# Patient Record
Sex: Male | Born: 1940
Health system: Southern US, Community
[De-identification: ages and names within clinical notes are randomized; demographics above are authoritative.]

## PROBLEM LIST (undated history)

## (undated) DIAGNOSIS — I499 Cardiac arrhythmia, unspecified: Secondary | ICD-10-CM

## (undated) DIAGNOSIS — M1712 Unilateral primary osteoarthritis, left knee: Secondary | ICD-10-CM

## (undated) DIAGNOSIS — N529 Male erectile dysfunction, unspecified: Secondary | ICD-10-CM

## (undated) DIAGNOSIS — J45909 Unspecified asthma, uncomplicated: Secondary | ICD-10-CM

## (undated) DIAGNOSIS — C4359 Malignant melanoma of other part of trunk: Secondary | ICD-10-CM

## (undated) DIAGNOSIS — N200 Calculus of kidney: Secondary | ICD-10-CM

## (undated) DIAGNOSIS — B351 Tinea unguium: Secondary | ICD-10-CM

## (undated) DIAGNOSIS — G4730 Sleep apnea, unspecified: Secondary | ICD-10-CM

## (undated) DIAGNOSIS — Z87442 Personal history of urinary calculi: Secondary | ICD-10-CM

## (undated) DIAGNOSIS — I10 Essential (primary) hypertension: Secondary | ICD-10-CM

## (undated) DIAGNOSIS — T7840XA Allergy, unspecified, initial encounter: Secondary | ICD-10-CM

## (undated) DIAGNOSIS — K5790 Diverticulosis of intestine, part unspecified, without perforation or abscess without bleeding: Secondary | ICD-10-CM

## (undated) DIAGNOSIS — K219 Gastro-esophageal reflux disease without esophagitis: Secondary | ICD-10-CM

## (undated) HISTORY — DX: Sleep apnea, unspecified: G47.30

## (undated) HISTORY — DX: Diverticulosis of intestine, part unspecified, without perforation or abscess without bleeding: K57.90

## (undated) HISTORY — DX: Unilateral primary osteoarthritis, left knee: M17.12

## (undated) HISTORY — DX: Male erectile dysfunction, unspecified: N52.9

## (undated) HISTORY — PX: OTHER SURGICAL HISTORY: SHX169

## (undated) HISTORY — DX: Gastro-esophageal reflux disease without esophagitis: K21.9

## (undated) HISTORY — PX: TOTAL KNEE ARTHROPLASTY: SHX125

## (undated) HISTORY — DX: Essential (primary) hypertension: I10

## (undated) HISTORY — PX: EYE SURGERY: SHX253

## (undated) HISTORY — DX: Allergy, unspecified, initial encounter: T78.40XA

## (undated) HISTORY — PX: NASAL POLYP SURGERY: SHX186

## (undated) HISTORY — PX: HERNIA REPAIR: SHX51

## (undated) HISTORY — DX: Calculus of kidney: N20.0

## (undated) HISTORY — DX: Tinea unguium: B35.1

## (undated) HISTORY — DX: Malignant melanoma of other part of trunk: C43.59

## (undated) HISTORY — PX: TONSILLECTOMY: SUR1361

---

## 1972-12-23 DIAGNOSIS — C4359 Malignant melanoma of other part of trunk: Secondary | ICD-10-CM

## 1972-12-23 HISTORY — PX: OTHER SURGICAL HISTORY: SHX169

## 1972-12-23 HISTORY — DX: Malignant melanoma of other part of trunk: C43.59

## 1972-12-23 HISTORY — PX: TONSILLECTOMY: SUR1361

## 1979-12-24 HISTORY — PX: OTHER SURGICAL HISTORY: SHX169

## 2003-11-22 ENCOUNTER — Encounter: Payer: Self-pay | Admitting: Internal Medicine

## 2003-11-22 ENCOUNTER — Encounter: Payer: Self-pay | Admitting: Gastroenterology

## 2004-11-20 ENCOUNTER — Ambulatory Visit: Payer: Self-pay | Admitting: Internal Medicine

## 2005-01-16 ENCOUNTER — Ambulatory Visit: Payer: Self-pay | Admitting: Internal Medicine

## 2005-02-08 ENCOUNTER — Ambulatory Visit: Payer: Self-pay | Admitting: Internal Medicine

## 2005-04-11 ENCOUNTER — Ambulatory Visit: Payer: Self-pay | Admitting: Internal Medicine

## 2005-10-21 ENCOUNTER — Ambulatory Visit: Payer: Self-pay | Admitting: Internal Medicine

## 2006-01-21 ENCOUNTER — Ambulatory Visit: Payer: Self-pay | Admitting: Internal Medicine

## 2006-01-28 ENCOUNTER — Ambulatory Visit: Payer: Self-pay | Admitting: Internal Medicine

## 2006-02-20 ENCOUNTER — Ambulatory Visit: Payer: Self-pay | Admitting: Internal Medicine

## 2006-09-25 ENCOUNTER — Ambulatory Visit: Payer: Self-pay | Admitting: Internal Medicine

## 2007-01-29 ENCOUNTER — Ambulatory Visit: Payer: Self-pay | Admitting: Internal Medicine

## 2007-01-29 LAB — CONVERTED CEMR LAB
ALT: 31 units/L (ref 0–40)
Albumin: 3.7 g/dL (ref 3.5–5.2)
Basophils Absolute: 0.1 10*3/uL (ref 0.0–0.1)
Bilirubin, Direct: 0.1 mg/dL (ref 0.0–0.3)
Calcium: 9.2 mg/dL (ref 8.4–10.5)
Cholesterol: 185 mg/dL (ref 0–200)
Eosinophils Absolute: 0.3 10*3/uL (ref 0.0–0.6)
Eosinophils Relative: 4.3 % (ref 0.0–5.0)
GFR calc Af Amer: 109 mL/min
GFR calc non Af Amer: 90 mL/min
Glucose, Bld: 96 mg/dL (ref 70–99)
HDL: 54.5 mg/dL (ref >39.0)
Lymphocytes Relative: 26 % (ref 12.0–46.0)
MCHC: 34 g/dL (ref 30.0–36.0)
MCV: 95.7 fL (ref 78.0–100.0)
Neutro Abs: 3.4 10*3/uL (ref 1.4–7.7)
Platelets: 205 10*3/uL (ref 150–400)
Sodium: 142 meq/L (ref 135–145)
TSH: 2.08 microintl units/mL (ref 0.35–5.50)
Total CHOL/HDL Ratio: 3.4
Triglycerides: 92 mg/dL (ref 0–149)
WBC: 6 10*3/uL (ref 4.5–10.5)

## 2007-08-11 ENCOUNTER — Ambulatory Visit: Payer: Self-pay | Admitting: Internal Medicine

## 2008-01-19 ENCOUNTER — Encounter: Payer: Self-pay | Admitting: Internal Medicine

## 2008-02-02 ENCOUNTER — Ambulatory Visit: Payer: Self-pay | Admitting: Internal Medicine

## 2008-02-02 LAB — CONVERTED CEMR LAB
Alkaline Phosphatase: 50 units/L (ref 39–117)
BUN: 17 mg/dL (ref 6–23)
Basophils Absolute: 0 10*3/uL (ref 0.0–0.1)
Bilirubin Urine: NEGATIVE
CO2: 32 meq/L (ref 19–32)
Cholesterol: 169 mg/dL (ref 0–200)
Eosinophils Absolute: 0.2 10*3/uL (ref 0.0–0.6)
GFR calc Af Amer: 96 mL/min
HDL: 46.4 mg/dL (ref >39.0)
Hemoglobin: 15.9 g/dL (ref 13.0–17.0)
Ketones, urine, test strip: NEGATIVE
Lymphocytes Relative: 27 % (ref 12.0–46.0)
MCHC: 33.9 g/dL (ref 30.0–36.0)
MCV: 96.5 fL (ref 78.0–100.0)
Monocytes Absolute: 0.6 10*3/uL (ref 0.2–0.7)
Monocytes Relative: 9.7 % (ref 3.0–11.0)
Neutro Abs: 3.7 10*3/uL (ref 1.4–7.7)
Potassium: 4 meq/L (ref 3.5–5.1)
Specific Gravity, Urine: 1.025
TSH: 2.33 microintl units/mL (ref 0.35–5.50)
Total Protein: 5.9 g/dL — ABNORMAL LOW (ref 6.0–8.3)

## 2008-02-09 ENCOUNTER — Ambulatory Visit: Payer: Self-pay | Admitting: Internal Medicine

## 2008-02-09 DIAGNOSIS — F528 Other sexual dysfunction not due to a substance or known physiological condition: Secondary | ICD-10-CM | POA: Insufficient documentation

## 2008-02-09 DIAGNOSIS — I10 Essential (primary) hypertension: Secondary | ICD-10-CM | POA: Insufficient documentation

## 2008-02-09 DIAGNOSIS — Z87442 Personal history of urinary calculi: Secondary | ICD-10-CM | POA: Insufficient documentation

## 2008-02-09 DIAGNOSIS — Z85820 Personal history of malignant melanoma of skin: Secondary | ICD-10-CM | POA: Insufficient documentation

## 2008-08-09 ENCOUNTER — Ambulatory Visit: Payer: Self-pay | Admitting: Internal Medicine

## 2008-09-27 ENCOUNTER — Encounter: Payer: Self-pay | Admitting: *Deleted

## 2008-10-12 ENCOUNTER — Ambulatory Visit: Payer: Self-pay | Admitting: Internal Medicine

## 2008-10-12 DIAGNOSIS — L570 Actinic keratosis: Secondary | ICD-10-CM | POA: Insufficient documentation

## 2008-11-07 ENCOUNTER — Telehealth: Payer: Self-pay | Admitting: Internal Medicine

## 2008-11-21 ENCOUNTER — Ambulatory Visit: Payer: Self-pay | Admitting: Gastroenterology

## 2008-11-21 DIAGNOSIS — R1319 Other dysphagia: Secondary | ICD-10-CM | POA: Insufficient documentation

## 2008-11-21 DIAGNOSIS — K219 Gastro-esophageal reflux disease without esophagitis: Secondary | ICD-10-CM | POA: Insufficient documentation

## 2008-12-07 ENCOUNTER — Ambulatory Visit: Payer: Self-pay | Admitting: Gastroenterology

## 2008-12-07 ENCOUNTER — Encounter: Payer: Self-pay | Admitting: Gastroenterology

## 2008-12-18 ENCOUNTER — Encounter: Payer: Self-pay | Admitting: Gastroenterology

## 2009-01-25 ENCOUNTER — Encounter: Payer: Self-pay | Admitting: Internal Medicine

## 2009-02-02 ENCOUNTER — Ambulatory Visit: Payer: Self-pay | Admitting: Internal Medicine

## 2009-02-02 LAB — CONVERTED CEMR LAB
Albumin: 3.8 g/dL (ref 3.5–5.2)
BUN: 20 mg/dL (ref 6–23)
Basophils Relative: 0.5 % (ref 0.0–3.0)
Creatinine, Ser: 1 mg/dL (ref 0.4–1.5)
Eosinophils Absolute: 0.2 10*3/uL (ref 0.0–0.7)
Eosinophils Relative: 3.4 % (ref 0.0–5.0)
GFR calc Af Amer: 96 mL/min
GFR calc non Af Amer: 79 mL/min
Glucose, Urine, Semiquant: NEGATIVE
HCT: 45.7 % (ref 39.0–52.0)
HDL: 50.1 mg/dL (ref >39.0)
Hemoglobin: 16.4 g/dL (ref 13.0–17.0)
MCV: 95 fL (ref 78.0–100.0)
Monocytes Absolute: 0.7 10*3/uL (ref 0.1–1.0)
Neutro Abs: 3.8 10*3/uL (ref 1.4–7.7)
PSA: 1 ng/mL (ref 0.10–4.00)
RBC: 4.8 M/uL (ref 4.22–5.81)
Specific Gravity, Urine: 1.02
Total Protein: 6.3 g/dL (ref 6.0–8.3)
WBC Urine, dipstick: NEGATIVE
WBC: 6.5 10*3/uL (ref 4.5–10.5)
pH: 6

## 2009-02-08 ENCOUNTER — Ambulatory Visit: Payer: Self-pay | Admitting: Internal Medicine

## 2009-02-08 DIAGNOSIS — B351 Tinea unguium: Secondary | ICD-10-CM | POA: Insufficient documentation

## 2009-02-16 ENCOUNTER — Ambulatory Visit: Payer: Self-pay | Admitting: Internal Medicine

## 2009-02-17 ENCOUNTER — Ambulatory Visit: Payer: Self-pay | Admitting: Internal Medicine

## 2009-02-21 ENCOUNTER — Telehealth: Payer: Self-pay | Admitting: Internal Medicine

## 2009-03-29 ENCOUNTER — Ambulatory Visit: Payer: Self-pay | Admitting: Internal Medicine

## 2009-03-29 LAB — CONVERTED CEMR LAB
Bilirubin, Direct: 0.2 mg/dL (ref 0.0–0.3)
Total Bilirubin: 1.2 mg/dL (ref 0.3–1.2)
Total Protein: 6.4 g/dL (ref 6.0–8.3)

## 2009-04-12 ENCOUNTER — Ambulatory Visit: Payer: Self-pay | Admitting: Internal Medicine

## 2009-10-04 ENCOUNTER — Ambulatory Visit: Payer: Self-pay | Admitting: Internal Medicine

## 2010-02-08 ENCOUNTER — Ambulatory Visit: Payer: Self-pay | Admitting: Internal Medicine

## 2010-02-08 LAB — CONVERTED CEMR LAB
ALT: 24 units/L (ref 0–53)
BUN: 19 mg/dL (ref 6–23)
Basophils Absolute: 0 10*3/uL (ref 0.0–0.1)
Bilirubin Urine: NEGATIVE
Bilirubin, Direct: 0.1 mg/dL (ref 0.0–0.3)
CO2: 31 meq/L (ref 19–32)
Chloride: 105 meq/L (ref 96–112)
Cholesterol: 159 mg/dL (ref 0–200)
Creatinine, Ser: 0.9 mg/dL (ref 0.4–1.5)
Eosinophils Absolute: 0.2 10*3/uL (ref 0.0–0.7)
Glucose, Bld: 94 mg/dL (ref 70–99)
Glucose, Urine, Semiquant: NEGATIVE
HCT: 45.6 % (ref 39.0–52.0)
Ketones, urine, test strip: NEGATIVE
LDL Cholesterol: 89 mg/dL (ref 0–99)
Lymphs Abs: 1.7 10*3/uL (ref 0.7–4.0)
MCHC: 33.9 g/dL (ref 30.0–36.0)
MCV: 96.7 fL (ref 78.0–100.0)
Monocytes Absolute: 0.6 10*3/uL (ref 0.1–1.0)
Neutrophils Relative %: 58 % (ref 43.0–77.0)
PSA: 1.31 ng/mL (ref 0.10–4.00)
Platelets: 171 10*3/uL (ref 150.0–400.0)
Potassium: 3.5 meq/L (ref 3.5–5.1)
Protein, U semiquant: NEGATIVE
RDW: 11.8 % (ref 11.5–14.6)
TSH: 2.34 microintl units/mL (ref 0.35–5.50)
Total Bilirubin: 0.7 mg/dL (ref 0.3–1.2)
Triglycerides: 99 mg/dL (ref 0.0–149.0)
Urobilinogen, UA: 0.2
WBC: 6 10*3/uL (ref 4.5–10.5)
pH: 6

## 2010-02-20 ENCOUNTER — Ambulatory Visit: Payer: Self-pay | Admitting: Internal Medicine

## 2010-02-20 DIAGNOSIS — I8310 Varicose veins of unspecified lower extremity with inflammation: Secondary | ICD-10-CM | POA: Insufficient documentation

## 2010-03-22 ENCOUNTER — Encounter: Payer: Self-pay | Admitting: Internal Medicine

## 2010-08-21 ENCOUNTER — Telehealth: Payer: Self-pay | Admitting: Internal Medicine

## 2010-08-23 ENCOUNTER — Ambulatory Visit: Payer: Self-pay | Admitting: Internal Medicine

## 2010-08-23 DIAGNOSIS — K4031 Unilateral inguinal hernia, with obstruction, without gangrene, recurrent: Secondary | ICD-10-CM | POA: Insufficient documentation

## 2010-09-04 ENCOUNTER — Ambulatory Visit: Payer: Self-pay | Admitting: Family Medicine

## 2010-09-05 ENCOUNTER — Encounter: Payer: Self-pay | Admitting: Internal Medicine

## 2010-09-05 ENCOUNTER — Encounter: Admission: RE | Admit: 2010-09-05 | Discharge: 2010-09-05 | Payer: Self-pay | Admitting: Internal Medicine

## 2010-09-10 ENCOUNTER — Encounter: Payer: Self-pay | Admitting: Internal Medicine

## 2010-09-17 ENCOUNTER — Encounter: Payer: Self-pay | Admitting: Internal Medicine

## 2010-09-17 ENCOUNTER — Encounter: Admission: RE | Admit: 2010-09-17 | Discharge: 2010-09-17 | Payer: Self-pay | Admitting: Surgery

## 2010-09-18 ENCOUNTER — Encounter: Admission: RE | Admit: 2010-09-18 | Discharge: 2010-09-18 | Payer: Self-pay | Admitting: Surgery

## 2010-09-18 ENCOUNTER — Encounter: Payer: Self-pay | Admitting: Internal Medicine

## 2010-09-19 ENCOUNTER — Telehealth: Payer: Self-pay | Admitting: Internal Medicine

## 2010-09-20 ENCOUNTER — Ambulatory Visit
Admission: RE | Admit: 2010-09-20 | Discharge: 2010-09-20 | Payer: Self-pay | Source: Home / Self Care | Admitting: Surgery

## 2011-01-22 NOTE — Consult Note (Signed)
Summary: [facility]  [facility]   Imported By: Maryln Gottron 09/17/2010 12:58:54  _____________________________________________________________________  External Attachment:    Type:   Image     Comment:   External Document

## 2011-01-22 NOTE — Letter (Signed)
Summary: [facility]  [facility]   Imported By: Maryln Gottron 04/04/2010 15:52:51  _____________________________________________________________________  External Attachment:    Type:   Image     Comment:   External Document

## 2011-01-22 NOTE — Progress Notes (Signed)
Summary: CT results  Phone Note Call from Patient Call back at Home Phone [phone]   Reason for Call: Talk to Nurse Summary of Call: CXR then CT yesterday. Initial call taken by: Rudy Jew, RN,  September 19, 2010 9:00 AM  Follow-up for Phone Call        pt informed of ct report with no evidence of chest nodule Follow-up by: Willy Eddy, LPN,  September 19, 2010 9:26 AM

## 2011-01-22 NOTE — Assessment & Plan Note (Signed)
Summary: cough/dm   Vital Signs:  Patient profile:   70 year old male Height:      72 inches (182.88 cm) Weight:      200 pounds (90.91 kg) O2 Sat:      98 % on Room air Temp:     98.2 degrees F (36.78 degrees C) oral Pulse rate:   78 / minute BP sitting:   138 / 80  (left arm) Cuff size:   regular  Vitals Entered By: Josph Macho RMA (September 04, 2010 9:28 AM)  O2 Flow:  Room air  Serial Vital Signs/Assessments:  Time      Position  BP       Pulse  Resp  Temp     By                     136/80                         Danise Edge MD  CC: Cough X1 month/ Flu vaccination/ CF Is Patient Diabetic? No   History of Present Illness: Patient in today with his wife noting a persistent cough for the last month. Flew to [place] and traveled around the [place] and was noting the cough. His symptoms developed upon arrival in [place] and persisted until his return. He denies fevers, chills, hemoptysis, headache, chest pain, palpitations, shortness of breath. He took azithromycin course at the end of August and his symptoms improved greatly but never fully recovered he describes a hacking cough that started in particular is generally at the end of the day. Her coughing can be severe to cause him to feel somewhat strangled just for seconds and then he improves. His cough is productive on occasion of gray sputum. He does acknowledge some anorexia but denies any nausea, vomiting, diarrhea, constipation. While in the week he try local cough syrup but unfortunately developed diarrhea with that. Upon discontinuation the diarrhea resolved. He denies any ear or throat pain. They were visiting their children and their children did not have any similar symptoms.  Current Medications (verified): 1)  Multivitamins   Tabs (Multiple Vitamin) .... Once Daily 2)  Oscal 500/200 D-3 500-200 Mg-Unit  Tabs (Calcium-Vitamin D) .... Take 2 Tablets By Mouth Once Daily 3)  Exforge Hct 10-320-25 Mg Tabs  (Amlodipine-Valsartan-Hctz) .... Take 1 Tablet By Mouth Once A Day 4)  Omeprazole 40 Mg  Cpdr (Omeprazole) .Marland Kitchen.. 1 Each Day 30 Minutes Before Meal 5)  Viagra 100 Mg Tabs (Sildenafil Citrate) .... One By Mouth As Directed 6)  Aspir-Low 81 Mg Tbec (Aspirin) .Marland Kitchen.. 1 Once Daily 7)  Vitamin C Cr 500 Mg Cr-Tabs (Ascorbic Acid) .Marland Kitchen.. 1 Once Daily 8)  Eye-Vites  Tabs (Multiple Vitamins-Minerals) .Marland Kitchen.. 1 Once Daily  Allergies (verified): No Known Drug Allergies  Past History:  Past medical history reviewed for relevance to current acute and chronic problems. Social history (including risk factors) reviewed for relevance to current acute and chronic problems.  Past Medical History: Reviewed history from 02/08/2009 and no changes required. Skin cancer, hx of-Removal of melanoma on back in 1974 Hypertension ED Renal Stones Diverticulosis fungal nail  Social History: Reviewed history from 11/21/2008 and no changes required. Married Never Smoked Drug use-no Alcohol Use - yes-2 glasses per week Patient gets regular exercise.  Review of Systems      See HPI       Flu Vaccine Consent Questions     Do  you have a history of severe allergic reactions to this vaccine? no    Any prior history of allergic reactions to egg and/or gelatin? no    Do you have a sensitivity to the preservative Thimersol? no    Do you have a past history of Guillan-Barre Syndrome? no    Do you currently have an acute febrile illness? no    Have you ever had a severe reaction to latex? no    Vaccine information given and explained to patient? yes    Are you currently pregnant? no    Lot Number:[id number]   Exp Date:06/22/2011   Site Given  Left Deltoid IM Josph Macho RMA  September 04, 2010 9:41 AM   Physical Exam  General:  Well-developed,well-nourished,in no acute distress; alert,appropriate and cooperative throughout examination Head:  Normocephalic and atraumatic without obvious abnormalities. No apparent  alopecia or balding. Ears:  External ear exam shows no significant lesions or deformities.  Otoscopic examination reveals clear canals, tympanic membranes are intact bilaterally without bulging, retraction, inflammation or discharge. Hearing is grossly normal bilaterally. Nose:  External nasal examination shows no deformity or inflammation. Nasal mucosa are pink and moist without lesions or exudates. Mouth:  Oral mucosa and oropharynx without lesions or exudates.   Neck:  No deformities, masses, or tenderness noted. Lungs:  Normal respiratory effort, chest expands symmetrically. Lungs are clear to auscultation, no crackles or wheezes. Heart:  Normal rate and regular rhythm. S1 and S2 normal without gallop, murmur, click, rub or other extra sounds. Abdomen:  Bowel sounds positive,abdomen soft and non-tender without masses, organomegaly or hernias noted. Extremities:  No clubbing, cyanosis, edema, or deformity noted  Cervical Nodes:  No lymphadenopathy noted Psych:  Cognition and judgment appear intact. Alert and cooperative with normal attention span and concentration. No apparent delusions, illusions, hallucinations   Impression & Recommendations:  Problem # 1:  ACUTE BRONCHITIS (ICD-466.0)  The following medications were removed from the medication list:    Zithromax Z-pak 250 Mg Tabs (Azithromycin) .Marland Kitchen... Take as directeed His updated medication list for this problem includes:    Tussionex Pennkinetic Er 10-8 Mg/70ml Lqcr (Hydrocod polst-chlorphen polst) .Marland Kitchen... 1 tsp by mouth at bedtime as needed cough    Tessalon 200 Mg Caps (Benzonatate) .Marland Kitchen... 1 cap by mouth two times a day as needed cough    Azithromycin 250 Mg Tabs (Azithromycin) .Marland Kitchen... 2 tabs by mouth once and then 1 tab by mouth once daily x 4 days, only take if symptoms worsen.  Orders: Prescription Created Electronically [phone])  Problem # 2:  HYPERTENSION (ICD-401.9)  His updated medication list for this problem includes:     Exforge Hct 10-320-25 Mg Tabs (Amlodipine-valsartan-hctz) .Marland Kitchen... Take 1 tablet by mouth once a day Well controlled on current meds, no change to therapy today  Problem # 3:  GERD (ICD-530.81)  His updated medication list for this problem includes:    Omeprazole 40 Mg Cpdr (Omeprazole) .Marland Kitchen... 1 each day 30 minutes before meal Well controlled on meds and no sign on PE that this is contributing to his cough  Complete Medication List: 1)  Multivitamins Tabs (Multiple vitamin) .... Once daily 2)  Oscal 500/200 D-3 500-200 Mg-unit Tabs (Calcium-vitamin d) .... Take 2 tablets by mouth once daily 3)  Exforge Hct 10-320-25 Mg Tabs (Amlodipine-valsartan-hctz) .... Take 1 tablet by mouth once a day 4)  Omeprazole 40 Mg Cpdr (Omeprazole) .Marland Kitchen.. 1 each day 30 minutes before meal 5)  Viagra 100 Mg Tabs (Sildenafil citrate) .Marland KitchenMarland KitchenMarland Kitchen  One by mouth as directed 6)  Aspir-low 81 Mg Tbec (Aspirin) .Marland Kitchen.. 1 once daily 7)  Vitamin C Cr 500 Mg Cr-tabs (Ascorbic acid) .Marland Kitchen.. 1 once daily 8)  Eye-vites Tabs (Multiple vitamins-minerals) .Marland Kitchen.. 1 once daily 9)  Tussionex Pennkinetic Er 10-8 Mg/52ml Lqcr (Hydrocod polst-chlorphen polst) .Marland Kitchen.. 1 tsp by mouth at bedtime as needed cough 10)  Tessalon 200 Mg Caps (Benzonatate) .Marland Kitchen.. 1 cap by mouth two times a day as needed cough 11)  Azithromycin 250 Mg Tabs (Azithromycin) .... 2 tabs by mouth once and then 1 tab by mouth once daily x 4 days  Other Orders: Flu Vaccine 17yrs + MEDICARE PATIENTS (W1191) Administration Flu vaccine - MCR (Y7829)  Patient Instructions: 1)  Please schedule a follow-up appointment as needed if symptoms worsen or do not improve 2)  Take your antibiotic as prescribed until ALL of it is gone, but stop if you develop a rash or swelling and contact our office as soon as possible. If you start the Azithromycin just make sure to finish the course 3)  Acute Bronchitis symptoms for less then 10 days are not  helped by antibiotics. Take over the counter cough  medications. Call if no improvement in 5-7 days, sooner if increasing cough, fever, or new symptoms ( shortness of breath, chest pain) .  Prescriptions: AZITHROMYCIN 250 MG TABS (AZITHROMYCIN) 2 tabs by mouth once and then 1 tab by mouth once daily x 4 days  #6 x 0   Entered and Authorized by:   Danise Edge MD   Signed by:   Danise Edge MD on 09/04/2010   Method used:   Print then Give to Patient   RxID:   [id number] TESSALON 200 MG CAPS (BENZONATATE) 1 cap by mouth two times a day as needed cough  #40 x 0   Entered and Authorized by:   Danise Edge MD   Signed by:   Danise Edge MD on 09/04/2010   Method used:   Print then Give to Patient   RxID:   [id number] TUSSIONEX PENNKINETIC ER 10-8 MG/5ML LQCR (HYDROCOD POLST-CHLORPHEN POLST) 1 tsp by mouth at bedtime as needed cough  #4 oz x 0   Entered and Authorized by:   Danise Edge MD   Signed by:   Danise Edge MD on 09/04/2010   Method used:   Print then Give to Patient   RxID:   [id number]

## 2011-01-22 NOTE — Letter (Signed)
Summary: Abnormal CXR pre-op/[facility]  Abnormal CXR pre-op/[facility]   Imported By: Maryln Gottron 09/25/2010 10:47:36  _____________________________________________________________________  External Attachment:    Type:   Image     Comment:   External Document

## 2011-01-22 NOTE — Assessment & Plan Note (Signed)
Summary: 6 month rov/njr rsc appt time/njr   Vital Signs:  Patient profile:   70 year old male Height:      72 inches Weight:      200 pounds BMI:     27.22 Temp:     98.2 degrees F oral Pulse rate:   72 / minute Resp:     14 per minute BP sitting:   136 / 88  (left arm)  Vitals Entered By: Willy Eddy, LPN (August 23, 2010 9:47 AM) CC: roa bp check- check and discuss varicose veins and thinks he has left hernia, Hypertension Management Is Patient Diabetic? No   Primary Care Provider:  Darryll Capers, MD  CC:  roa bp check- check and discuss varicose veins and thinks he has left hernia and Hypertension Management.  History of Present Illness: possible hernia on the left side had been traveling with increased pain and bulding at side the pts varicosities also have flaired and he has noted increased swelling and pain  Hypertension History:      He denies headache, chest pain, palpitations, dyspnea with exertion, orthopnea, PND, peripheral edema, visual symptoms, neurologic problems, syncope, and side effects from treatment.        Positive major cardiovascular risk factors include male age 55 years old or older and hypertension.  Negative major cardiovascular risk factors include non-tobacco-user status.     Preventive Screening-Counseling & Management  Alcohol-Tobacco     Smoking Status: never  Problems Prior to Update: 1)  Varicose Veins Lower Extremities W/inflammation  (ICD-454.1) 2)  Dermatophytosis of Nail  (ICD-110.1) 3)  Gerd  (ICD-530.81) 4)  Dysphagia  (ICD-787.29) 5)  Dysphagia Unspecified  (ICD-787.20) 6)  Actinic Keratosis  (ICD-702.0) 7)  Preventive Health Care  (ICD-V70.0) 8)  Family History of Colon Ca 1st Degree Relative <60  (ICD-V16.0) 9)  Hypertension  (ICD-401.9) 10)  Skin Cancer, Hx of  (ICD-V10.83) 11)  Renal Calculus, Hx of  (ICD-V13.01) 12)  Erectile Dysfunction  (ICD-302.72)  Current Problems (verified): 1)  Varicose Veins Lower  Extremities W/inflammation  (ICD-454.1) 2)  Dermatophytosis of Nail  (ICD-110.1) 3)  Gerd  (ICD-530.81) 4)  Dysphagia  (ICD-787.29) 5)  Dysphagia Unspecified  (ICD-787.20) 6)  Actinic Keratosis  (ICD-702.0) 7)  Preventive Health Care  (ICD-V70.0) 8)  Family History of Colon Ca 1st Degree Relative <60  (ICD-V16.0) 9)  Hypertension  (ICD-401.9) 10)  Skin Cancer, Hx of  (ICD-V10.83) 11)  Renal Calculus, Hx of  (ICD-V13.01) 12)  Erectile Dysfunction  (ICD-302.72)  Medications Prior to Update: 1)  Multivitamins   Tabs (Multiple Vitamin) .... Once Daily 2)  Oscal 500/200 D-3 500-200 Mg-Unit  Tabs (Calcium-Vitamin D) .... Take 2 Tablets By Mouth Once Daily 3)  Exforge Hct 10-320-25 Mg Tabs (Amlodipine-Valsartan-Hctz) .... Take 1 Tablet By Mouth Once A Day 4)  Omeprazole 40 Mg  Cpdr (Omeprazole) .Marland Kitchen.. 1 Each Day 30 Minutes Before Meal 5)  Viagra 100 Mg Tabs (Sildenafil Citrate) .... One By Mouth As Directed 6)  Aspir-Low 81 Mg Tbec (Aspirin) .Marland Kitchen.. 1 Once Daily 7)  Vitamin C Cr 500 Mg Cr-Tabs (Ascorbic Acid) .Marland Kitchen.. 1 Once Daily 8)  Eye-Vites  Tabs (Multiple Vitamins-Minerals) .Marland Kitchen.. 1 Once Daily 9)  Zithromax Z-Pak 250 Mg Tabs (Azithromycin) .... Take As Directeed  Current Medications (verified): 1)  Multivitamins   Tabs (Multiple Vitamin) .... Once Daily 2)  Oscal 500/200 D-3 500-200 Mg-Unit  Tabs (Calcium-Vitamin D) .... Take 2 Tablets By Mouth Once Daily 3)  Exforge Hct 10-320-25 Mg Tabs (Amlodipine-Valsartan-Hctz) .... Take 1 Tablet By Mouth Once A Day 4)  Omeprazole 40 Mg  Cpdr (Omeprazole) .Marland Kitchen.. 1 Each Day 30 Minutes Before Meal 5)  Viagra 100 Mg Tabs (Sildenafil Citrate) .... One By Mouth As Directed 6)  Aspir-Low 81 Mg Tbec (Aspirin) .Marland Kitchen.. 1 Once Daily 7)  Vitamin C Cr 500 Mg Cr-Tabs (Ascorbic Acid) .Marland Kitchen.. 1 Once Daily 8)  Eye-Vites  Tabs (Multiple Vitamins-Minerals) .Marland Kitchen.. 1 Once Daily 9)  Zithromax Z-Pak 250 Mg Tabs (Azithromycin) .... Take As Directeed  Allergies (verified): No Known Drug  Allergies  Past History:  Family History: Last updated: 11/21/2008 father Family History of Colon CA 1st degree relative <60: Father and 2 Paternal Uncles Family History Hypertension  Social History: Last updated: 11/21/2008 Married Never Smoked Drug use-no Alcohol Use - yes-2 glasses per week Patient gets regular exercise.  Risk Factors: Exercise: yes (11/21/2008)  Risk Factors: Smoking Status: never (08/23/2010)  Past medical, surgical, family and social histories (including risk factors) reviewed, and no changes noted (except as noted below).  Past Medical History: Reviewed history from 02/08/2009 and no changes required. Skin cancer, hx of-Removal of melanoma on back in 1974 Hypertension ED Renal Stones Diverticulosis fungal nail  Past Surgical History: Reviewed history from 11/16/2008 and no changes required. Tonsillectomy 1974 lymph node disection 1981 melanoma removal of back 1974 Left leg vein stripping Inguinal hernia repair  Family History: Reviewed history from 11/21/2008 and no changes required. father Family History of Colon CA 1st degree relative <60: Father and 2 Paternal Uncles Family History Hypertension  Social History: Reviewed history from 11/21/2008 and no changes required. Married Never Smoked Drug use-no Alcohol Use - yes-2 glasses per week Patient gets regular exercise.  Review of Systems  The patient denies anorexia, fever, weight loss, weight gain, vision loss, decreased hearing, hoarseness, chest pain, syncope, dyspnea on exertion, peripheral edema, prolonged cough, headaches, hemoptysis, abdominal pain, melena, hematochezia, severe indigestion/heartburn, hematuria, incontinence, genital sores, muscle weakness, suspicious skin lesions, transient blindness, difficulty walking, depression, unusual weight change, abnormal bleeding, enlarged lymph nodes, angioedema, breast masses, and testicular masses.    Physical Exam  General:   Well developed, well nourished, no acute distress. Head:  Normocephalic and atraumatic. Eyes:  PERRLA, no icterus. Ears:  Normal auditory acuity. Nose:  External nasal examination shows no deformity or inflammation. Nasal mucosa are pink and moist without lesions or exudates. Mouth:  No deformity or lesions, dentition normal. Lungs:  Clear throughout to auscultation. Heart:  Regular rate and rhythm; no murmurs, rubs,  or bruits. Abdomen:  Soft, nontender and nondistended. No masses, hepatosplenomegaly or hernias noted. Normal bowel sounds. Genitalia:  left inguinal hernia Prostate:  no gland enlargement and no asymmetry.     Impression & Recommendations:  Problem # 1:  VARICOSE VEINS LOWER EXTREMITIES W/INFLAMMATION (ICD-454.1)  pain and swelling in the left greater that the right  Orders: Radiology Referral (Radiology)  Problem # 2:  HYPERTENSION (ICD-401.9) Assessment: Unchanged stable results, refill needed His updated medication list for this problem includes:    Exforge Hct 10-320-25 Mg Tabs (Amlodipine-valsartan-hctz) .Marland Kitchen... Take 1 tablet by mouth once a day  BP today: 136/88 Prior BP: 146/84 (02/20/2010)  10 Yr Risk Heart Disease: 9 % Prior 10 Yr Risk Heart Disease: 18 % (02/08/2009)  Labs Reviewed: K+: 3.5 (02/08/2010) Creat: : 0.9 (02/08/2010)   Chol: 159 (02/08/2010)   HDL: 50.70 (02/08/2010)   LDL: 89 (02/08/2010)   TG: 99.0 (02/08/2010)  Problem # 3:  ING HERN W/OBST W/O MENTION GANGREN RECUR BILAT (ICD-550.13) increased pain and swelling  remained reducoible but has increased dramatically Orders: Surgical Referral (Surgery) referral to dr cornett for surgical evaluaton  Complete Medication List: 1)  Multivitamins Tabs (Multiple vitamin) .... Once daily 2)  Oscal 500/200 D-3 500-200 Mg-unit Tabs (Calcium-vitamin d) .... Take 2 tablets by mouth once daily 3)  Exforge Hct 10-320-25 Mg Tabs (Amlodipine-valsartan-hctz) .... Take 1 tablet by mouth once a day 4)   Omeprazole 40 Mg Cpdr (Omeprazole) .Marland Kitchen.. 1 each day 30 minutes before meal 5)  Viagra 100 Mg Tabs (Sildenafil citrate) .... One by mouth as directed 6)  Aspir-low 81 Mg Tbec (Aspirin) .Marland Kitchen.. 1 once daily 7)  Vitamin C Cr 500 Mg Cr-tabs (Ascorbic acid) .Marland Kitchen.. 1 once daily 8)  Eye-vites Tabs (Multiple vitamins-minerals) .Marland Kitchen.. 1 once daily 9)  Zithromax Z-pak 250 Mg Tabs (Azithromycin) .... Take as directeed  Hypertension Assessment/Plan:      The patient's hypertensive risk group is category B: At least one risk factor (excluding diabetes) with no target organ damage.  His calculated 10 year risk of coronary heart disease is 9 %.  Today's blood pressure is 136/88.  His blood pressure goal is < 140/90.  Patient Instructions: 1)  feb CPX  labs in advance

## 2011-01-22 NOTE — Progress Notes (Signed)
Summary: sinus infection  Phone Note Call from Patient Call back at Home Phone [phone]   Caller: Patient Call For: Stacie Glaze MD Summary of Call: Pt complains of sinus congestion, cough and green mucus.  Requesting antibiotic. [facility] Initial call taken by: Lynann Beaver CMA,  August 21, 2010 1:26 PM  Follow-up for Phone Call        per dr Lowella Dandy z pack and alolegra d otc Follow-up by: Willy Eddy, LPN,  August 21, 2010 5:42 PM    New/Updated Medications: ZITHROMAX Z-PAK 250 MG TABS (AZITHROMYCIN) take as directeed Prescriptions: ZITHROMAX Z-PAK 250 MG TABS (AZITHROMYCIN) take as directeed  #1 x 0   Entered by:   Willy Eddy, LPN   Authorized by:   Stacie Glaze MD   Signed by:   Willy Eddy, LPN on 09/81/1914   Method used:   Electronically to        [facility] [phone]* (retail)       [address] [place], [place]  [address]       Ph: [phone]       Fax: [phone]   RxID:   [id number]

## 2011-01-22 NOTE — Assessment & Plan Note (Signed)
Summary: CPX/NJR/PT RESCD FROM BUMP//CCM   Vital Signs:  Patient profile:   70 year old male Height:      72 inches Weight:      199 pounds BMI:     27.09 Temp:     98.4 degrees F oral Pulse rate:   72 / minute Resp:     14 per minute BP sitting:   146 / 84  (left arm)  Vitals Entered By: Willy Eddy, LPN (February 20, 1609 11:35 AM) CC: annual visit for disease management   Primary Care Provider:  Darryll Capers, MD  CC:  annual visit for disease management.  History of Present Illness: The pt was asked about all immunizations, health maint. services that are appropriate to their age and was given guidance on diet exercize  and weight management   Preventive Screening-Counseling & Management  Alcohol-Tobacco     Smoking Status: never  Problems Prior to Update: 1)  Dermatophytosis of Nail  (ICD-110.1) 2)  Gerd  (ICD-530.81) 3)  Dysphagia  (ICD-787.29) 4)  Dysphagia Unspecified  (ICD-787.20) 5)  Actinic Keratosis  (ICD-702.0) 6)  Preventive Health Care  (ICD-V70.0) 7)  Family History of Colon Ca 1st Degree Relative <60  (ICD-V16.0) 8)  Hypertension  (ICD-401.9) 9)  Skin Cancer, Hx of  (ICD-V10.83) 10)  Renal Calculus, Hx of  (ICD-V13.01) 11)  Erectile Dysfunction  (ICD-302.72)  Current Problems (verified): 1)  Dermatophytosis of Nail  (ICD-110.1) 2)  Gerd  (ICD-530.81) 3)  Dysphagia  (ICD-787.29) 4)  Dysphagia Unspecified  (ICD-787.20) 5)  Actinic Keratosis  (ICD-702.0) 6)  Preventive Health Care  (ICD-V70.0) 7)  Family History of Colon Ca 1st Degree Relative <60  (ICD-V16.0) 8)  Hypertension  (ICD-401.9) 9)  Skin Cancer, Hx of  (ICD-V10.83) 10)  Renal Calculus, Hx of  (ICD-V13.01) 11)  Erectile Dysfunction  (ICD-302.72)  Medications Prior to Update: 1)  Multivitamins   Tabs (Multiple Vitamin) .... Once Daily 2)  Oscal 500/200 D-3 500-200 Mg-Unit  Tabs (Calcium-Vitamin D) .... Take 2 Tablets By Mouth Once Daily 3)  Exforge Hct 10-320-25 Mg Tabs  (Amlodipine-Valsartan-Hctz) .... Take 1 Tablet By Mouth Once A Day 4)  Omeprazole 40 Mg  Cpdr (Omeprazole) .Marland Kitchen.. 1 Each Day 30 Minutes Before Meal 5)  Viagra 100 Mg Tabs (Sildenafil Citrate) .... One By Mouth As Directed  Current Medications (verified): 1)  Multivitamins   Tabs (Multiple Vitamin) .... Once Daily 2)  Oscal 500/200 D-3 500-200 Mg-Unit  Tabs (Calcium-Vitamin D) .... Take 2 Tablets By Mouth Once Daily 3)  Exforge Hct 10-320-25 Mg Tabs (Amlodipine-Valsartan-Hctz) .... Take 1 Tablet By Mouth Once A Day 4)  Omeprazole 40 Mg  Cpdr (Omeprazole) .Marland Kitchen.. 1 Each Day 30 Minutes Before Meal 5)  Viagra 100 Mg Tabs (Sildenafil Citrate) .... One By Mouth As Directed 6)  Aspir-Low 81 Mg Tbec (Aspirin) .Marland Kitchen.. 1 Once Daily 7)  Vitamin C Cr 500 Mg Cr-Tabs (Ascorbic Acid) .Marland Kitchen.. 1 Once Daily 8)  Eye-Vites  Tabs (Multiple Vitamins-Minerals) .Marland Kitchen.. 1 Once Daily  Allergies (verified): No Known Drug Allergies  Past History:  Family History: Last updated: 11/21/2008 father Family History of Colon CA 1st degree relative <60: Father and 2 Paternal Uncles Family History Hypertension  Social History: Last updated: 11/21/2008 Married Never Smoked Drug use-no Alcohol Use - yes-2 glasses per week Patient gets regular exercise.  Risk Factors: Exercise: yes (11/21/2008)  Risk Factors: Smoking Status: never (02/20/2010)  Past medical, surgical, family and social histories (including risk factors) reviewed,  and no changes noted (except as noted below).  Past Medical History: Reviewed history from 02/08/2009 and no changes required. Skin cancer, hx of-Removal of melanoma on back in 1974 Hypertension ED Renal Stones Diverticulosis fungal nail  Past Surgical History: Reviewed history from 11/16/2008 and no changes required. Tonsillectomy 1974 lymph node disection 1981 melanoma removal of back 1974 Left leg vein stripping Inguinal hernia repair  Family History: Reviewed history from  11/21/2008 and no changes required. father Family History of Colon CA 1st degree relative <60: Father and 2 Paternal Uncles Family History Hypertension  Social History: Reviewed history from 11/21/2008 and no changes required. Married Never Smoked Drug use-no Alcohol Use - yes-2 glasses per week Patient gets regular exercise.  Review of Systems  The patient denies anorexia, fever, weight loss, weight gain, vision loss, decreased hearing, hoarseness, chest pain, syncope, dyspnea on exertion, peripheral edema, prolonged cough, headaches, hemoptysis, abdominal pain, melena, hematochezia, severe indigestion/heartburn, hematuria, incontinence, genital sores, muscle weakness, suspicious skin lesions, transient blindness, difficulty walking, depression, unusual weight change, abnormal bleeding, enlarged lymph nodes, angioedema, and breast masses.    Physical Exam  General:  Well developed, well nourished, no acute distress. Head:  Normocephalic and atraumatic. Eyes:  PERRLA, no icterus. Ears:  Normal auditory acuity. Nose:  External nasal examination shows no deformity or inflammation. Nasal mucosa are pink and moist without lesions or exudates. Mouth:  No deformity or lesions, dentition normal. Lungs:  Clear throughout to auscultation. Heart:  Regular rate and rhythm; no murmurs, rubs,  or bruits. Abdomen:  Soft, nontender and nondistended. No masses, hepatosplenomegaly or hernias noted. Normal bowel sounds. Rectal:  deferred until time of colonoscopy.   Genitalia:  circumcised.   Prostate:  no nodules, no asymmetry, and 1+ enlarged.   Msk:  Symmetrical with no gross deformities. Normal posture. Pulses:  Normal pulses noted. Extremities:  trace edema with marked varicosity Neurologic:  Alert and  oriented x4;  grossly normal neurologically. Skin:  Intact without suspicious lesions or rashes Cervical Nodes:  No lymphadenopathy noted Axillary Nodes:  No palpable lymphadenopathy Psych:   Cognition and judgment appear intact. Alert and cooperative with normal attention span and concentration. No apparent delusions, illusions, hallucinations   Impression & Recommendations:  Problem # 1:  PREVENTIVE HEALTH CARE (ICD-V70.0)  Colonoscopy: Location:  [facility].   (12/07/2008) Td Booster: Tdap (01/28/2006)   Flu Vax: Fluvax 3+ (10/04/2009)   Pneumovax: Historical (01/28/2006) Chol: 159 (02/08/2010)   HDL: 50.70 (02/08/2010)   LDL: 89 (02/08/2010)   TG: 99.0 (02/08/2010) TSH: 2.34 (02/08/2010)   PSA: 1.31 (02/08/2010) Next Colonoscopy due:: 12/2013 (12/07/2008)  Discussed using sunscreen, use of alcohol, drug use, self testicular exam, routine dental care, routine eye care, routine physical exam, seat belts, multiple vitamins, osteoporosis prevention, adequate calcium intake in diet, and recommendations for immunizations.  Discussed exercise and checking cholesterol.  Discussed gun safety, safe sex, and contraception. Also recommend checking PSA.  Problem # 2:  HYPERTENSION (ICD-401.9)  His updated medication list for this problem includes:    Exforge Hct 10-320-25 Mg Tabs (Amlodipine-valsartan-hctz) .Marland Kitchen... Take 1 tablet by mouth once a day  Problem # 3:  VARICOSE VEINS LOWER EXTREMITIES W/INFLAMMATION (ICD-454.1) discusson of referral for laser treatments  Complete Medication List: 1)  Multivitamins Tabs (Multiple vitamin) .... Once daily 2)  Oscal 500/200 D-3 500-200 Mg-unit Tabs (Calcium-vitamin d) .... Take 2 tablets by mouth once daily 3)  Exforge Hct 10-320-25 Mg Tabs (Amlodipine-valsartan-hctz) .... Take 1 tablet by mouth once a  day 4)  Omeprazole 40 Mg Cpdr (Omeprazole) .Marland Kitchen.. 1 each day 30 minutes before meal 5)  Viagra 100 Mg Tabs (Sildenafil citrate) .... One by mouth as directed 6)  Aspir-low 81 Mg Tbec (Aspirin) .Marland Kitchen.. 1 once daily 7)  Vitamin C Cr 500 Mg Cr-tabs (Ascorbic acid) .Marland Kitchen.. 1 once daily 8)  Eye-vites Tabs (Multiple vitamins-minerals) .Marland Kitchen.. 1  once daily  Patient Instructions: 1)  Please schedule a follow-up appointment in 6 months. Prescriptions: OMEPRAZOLE 40 MG  CPDR (OMEPRAZOLE) 1 each day 30 minutes before meal  #90 x 3   Entered by:   Willy Eddy, LPN   Authorized by:   Stacie Glaze MD   Signed by:   Willy Eddy, LPN on 16/09/9603   Method used:   Electronically to        [facility]* (retail)       [address]       [place], [place]  [id number]       Ph: [phone]       Fax: [phone]   RxID:   [id number]

## 2011-01-22 NOTE — Consult Note (Signed)
Summary: [facility]   Imported By: Maryln Gottron 09/20/2010 15:00:47  _____________________________________________________________________  External Attachment:    Type:   Image     Comment:   External Document

## 2011-01-25 ENCOUNTER — Other Ambulatory Visit: Payer: Self-pay | Admitting: Internal Medicine

## 2011-02-19 ENCOUNTER — Other Ambulatory Visit (INDEPENDENT_AMBULATORY_CARE_PROVIDER_SITE_OTHER): Payer: Medicare Other | Admitting: Internal Medicine

## 2011-02-19 DIAGNOSIS — I10 Essential (primary) hypertension: Secondary | ICD-10-CM

## 2011-02-19 DIAGNOSIS — Z125 Encounter for screening for malignant neoplasm of prostate: Secondary | ICD-10-CM

## 2011-02-19 DIAGNOSIS — K219 Gastro-esophageal reflux disease without esophagitis: Secondary | ICD-10-CM

## 2011-02-19 DIAGNOSIS — Z0000 Encounter for general adult medical examination without abnormal findings: Secondary | ICD-10-CM

## 2011-02-19 LAB — CBC WITH DIFFERENTIAL/PLATELET
Basophils Relative: 0.7 % (ref 0.0–3.0)
Eosinophils Absolute: 0.2 10*3/uL (ref 0.0–0.7)
Eosinophils Relative: 2.9 % (ref 0.0–5.0)
Hemoglobin: 15.6 g/dL (ref 13.0–17.0)
Lymphocytes Relative: 29.3 % (ref 12.0–46.0)
MCHC: 34.7 g/dL (ref 30.0–36.0)
MCV: 96.3 fl (ref 78.0–100.0)
Monocytes Absolute: 0.6 10*3/uL (ref 0.1–1.0)
Neutro Abs: 3.7 10*3/uL (ref 1.4–7.7)
Neutrophils Relative %: 58.4 % (ref 43.0–77.0)
RBC: 4.66 Mil/uL (ref 4.22–5.81)
WBC: 6.4 10*3/uL (ref 4.5–10.5)

## 2011-02-19 LAB — BASIC METABOLIC PANEL
CO2: 30 mEq/L (ref 19–32)
Chloride: 104 mEq/L (ref 96–112)
Creatinine, Ser: 0.9 mg/dL (ref 0.4–1.5)
Sodium: 141 mEq/L (ref 135–145)

## 2011-02-19 LAB — POCT URINALYSIS DIPSTICK
Bilirubin, UA: NEGATIVE
Glucose, UA: NEGATIVE
Ketones, UA: NEGATIVE
Leukocytes, UA: NEGATIVE
Nitrite, UA: NEGATIVE

## 2011-02-19 LAB — HEPATIC FUNCTION PANEL
ALT: 25 U/L (ref 0–53)
Albumin: 3.7 g/dL (ref 3.5–5.2)
Alkaline Phosphatase: 50 U/L (ref 39–117)
Bilirubin, Direct: 0.1 mg/dL (ref 0.0–0.3)
Total Protein: 5.6 g/dL — ABNORMAL LOW (ref 6.0–8.3)

## 2011-02-19 LAB — LIPID PANEL: Total CHOL/HDL Ratio: 3

## 2011-02-19 LAB — PSA: PSA: 1.45 ng/mL (ref 0.10–4.00)

## 2011-02-19 LAB — TSH: TSH: 2.55 u[IU]/mL (ref 0.35–5.50)

## 2011-02-26 ENCOUNTER — Encounter: Payer: Self-pay | Admitting: Internal Medicine

## 2011-02-26 ENCOUNTER — Ambulatory Visit (INDEPENDENT_AMBULATORY_CARE_PROVIDER_SITE_OTHER): Payer: Medicare Other | Admitting: Internal Medicine

## 2011-03-06 ENCOUNTER — Other Ambulatory Visit: Payer: Self-pay | Admitting: Internal Medicine

## 2011-03-07 LAB — COMPREHENSIVE METABOLIC PANEL
AST: 26 U/L (ref 0–37)
Albumin: 3.6 g/dL (ref 3.5–5.2)
Alkaline Phosphatase: 68 U/L (ref 39–117)
BUN: 18 mg/dL (ref 6–23)
GFR calc Af Amer: >60 mL/min (ref >60)
Potassium: 3.9 mEq/L (ref 3.5–5.1)
Sodium: 142 mEq/L (ref 135–145)
Total Protein: 6.4 g/dL (ref 6.0–8.3)

## 2011-03-07 LAB — CBC
MCV: 89.8 fL (ref 78.0–100.0)
Platelets: 177 10*3/uL (ref 150–400)
RBC: 4.92 MIL/uL (ref 4.22–5.81)
RDW: 12.2 % (ref 11.5–15.5)
WBC: 8.1 10*3/uL (ref 4.0–10.5)

## 2011-03-07 LAB — DIFFERENTIAL
Basophils Relative: 1 % (ref 0–1)
Eosinophils Relative: 2 % (ref 0–5)
Monocytes Absolute: 0.6 10*3/uL (ref 0.1–1.0)
Monocytes Relative: 7 % (ref 3–12)
Neutro Abs: 5.8 10*3/uL (ref 1.7–7.7)

## 2011-05-22 ENCOUNTER — Other Ambulatory Visit: Payer: Self-pay | Admitting: Internal Medicine

## 2011-06-04 ENCOUNTER — Telehealth: Payer: Self-pay | Admitting: Internal Medicine

## 2011-06-04 NOTE — Telephone Encounter (Signed)
Please put him on Friday's schedule at 11:45 am- pt has been notified-thanks( [employer] double book)

## 2011-06-04 NOTE — Telephone Encounter (Signed)
Pt would like to come in for cortisone inj in shoulder. Please advise

## 2011-06-07 ENCOUNTER — Ambulatory Visit (INDEPENDENT_AMBULATORY_CARE_PROVIDER_SITE_OTHER): Payer: Medicare Other | Admitting: Internal Medicine

## 2011-06-07 VITALS — BP 130/80 | HR 76 | Temp 98.0°F | Resp 16

## 2011-06-07 DIAGNOSIS — M19019 Primary osteoarthritis, unspecified shoulder: Secondary | ICD-10-CM

## 2011-06-07 MED ORDER — METHYLPREDNISOLONE ACETATE 40 MG/ML IJ SUSP: 40.0000 mg | Freq: Once | INTRAMUSCULAR | Status: DC

## 2011-06-07 NOTE — Telephone Encounter (Signed)
Pt is sch for today 4pm. Pt is aware.

## 2011-06-07 NOTE — Progress Notes (Signed)
  Subjective:    Patient ID: Wesley Fuller, male    DOB: 1941-09-06, 70 y.o.   MRN: [id number]  HPI Patient is 70 year old white male who presents for acute shoulder pain.  He was evaluated by a orthopedic PA and a joint injection was suggested he deferred coming to his primary care office with a joint injection. The   Review of Systems  Constitutional: Negative for fever and fatigue.  HENT: Negative for hearing loss, congestion, neck pain and postnasal drip.   Eyes: Negative for discharge, redness and visual disturbance.  Respiratory: Negative for cough, shortness of breath and wheezing.   Cardiovascular: Negative for leg swelling.  Gastrointestinal: Negative for abdominal pain, constipation and abdominal distention.  Genitourinary: Negative for urgency and frequency.  Musculoskeletal: Negative for joint swelling and arthralgias.  Skin: Negative for color change and rash.  Neurological: Negative for weakness and light-headedness.  Hematological: Negative for adenopathy.  Psychiatric/Behavioral: Negative for behavioral problems.       Objective:   Physical Exam    Blood pressure 130/80, pulse 76, temperature 98 F (36.7 C), resp. rate 16. On physical examination is a pleasant well-developed white male in no apparent distress examination of the shoulder reveals tenderness in posterior aspect of the right shoulder.  Patient gave informed consent and site was prepped with Betadine 40 mg of Depo-Medrol and 1/2 cc of lidocaine was injected in the joint space the patient tolerated the procedure well post care instructions were given to the patient.    Assessment & Plan:

## 2011-08-22 ENCOUNTER — Encounter: Payer: Self-pay | Admitting: Internal Medicine

## 2011-09-03 ENCOUNTER — Ambulatory Visit (INDEPENDENT_AMBULATORY_CARE_PROVIDER_SITE_OTHER): Payer: Medicare Other | Admitting: Internal Medicine

## 2011-09-03 ENCOUNTER — Encounter: Payer: Self-pay | Admitting: Internal Medicine

## 2011-09-03 VITALS — BP 138/88 | HR 71 | Temp 98.2°F | Wt 200.0 lb

## 2011-09-03 DIAGNOSIS — M8580 Other specified disorders of bone density and structure, unspecified site: Secondary | ICD-10-CM

## 2011-09-03 DIAGNOSIS — M949 Disorder of cartilage, unspecified: Secondary | ICD-10-CM

## 2011-09-03 DIAGNOSIS — H9190 Unspecified hearing loss, unspecified ear: Secondary | ICD-10-CM

## 2011-09-03 DIAGNOSIS — Z23 Encounter for immunization: Secondary | ICD-10-CM

## 2011-09-03 DIAGNOSIS — I10 Essential (primary) hypertension: Secondary | ICD-10-CM

## 2011-09-03 DIAGNOSIS — N529 Male erectile dysfunction, unspecified: Secondary | ICD-10-CM

## 2011-09-03 DIAGNOSIS — K219 Gastro-esophageal reflux disease without esophagitis: Secondary | ICD-10-CM

## 2011-09-03 DIAGNOSIS — M899 Disorder of bone, unspecified: Secondary | ICD-10-CM

## 2011-09-03 DIAGNOSIS — E785 Hyperlipidemia, unspecified: Secondary | ICD-10-CM

## 2011-09-03 LAB — CBC WITH DIFFERENTIAL/PLATELET
Basophils Absolute: 0.1 10*3/uL (ref 0.0–0.1)
Basophils Relative: 0.9 % (ref 0.0–3.0)
Eosinophils Relative: 3.6 % (ref 0.0–5.0)
HCT: 46.4 % (ref 39.0–52.0)
Hemoglobin: 15.7 g/dL (ref 13.0–17.0)
Lymphs Abs: 1.3 10*3/uL (ref 0.7–4.0)
Monocytes Relative: 9.7 % (ref 3.0–12.0)
Neutro Abs: 3.4 10*3/uL (ref 1.4–7.7)
RBC: 4.75 Mil/uL (ref 4.22–5.81)
RDW: 12.9 % (ref 11.5–14.6)

## 2011-09-03 LAB — BASIC METABOLIC PANEL
BUN: 16 mg/dL (ref 6–23)
CO2: 29 mEq/L (ref 19–32)
Chloride: 105 mEq/L (ref 96–112)
Creatinine, Ser: 0.8 mg/dL (ref 0.4–1.5)

## 2011-09-03 LAB — LIPID PANEL
Total CHOL/HDL Ratio: 3
Triglycerides: 69 mg/dL (ref 0.0–149.0)

## 2011-09-03 MED ORDER — SILDENAFIL CITRATE 100 MG PO TABS: 100.0000 mg | ORAL_TABLET | Freq: Every day | ORAL | Status: DC | PRN

## 2011-09-03 MED ORDER — AMLODIPINE-VALSARTAN-HCTZ 10-320-25 MG PO TABS: 1.0000 | ORAL_TABLET | Freq: Every day | ORAL | Status: DC

## 2011-09-03 NOTE — Progress Notes (Signed)
Addended by: Azucena Freed on: 09/03/2011 02:16 PM   Modules accepted: Orders

## 2011-09-03 NOTE — Progress Notes (Signed)
  Subjective:    Patient ID: Wesley Fuller, male    DOB: Feb 12, 1941, 70 y.o.   MRN: [id number]  HPI Patient presents for followup of hypertension hyperlipidemia and esophageal reflux.  He has had an EGD diagnosing esophageal reflux and requires chronic proton pump inhibitor use he is on blood pressure medications and no  lipid-lowering drugs He is currently on Prilosec 40 mg by mouth daily and combination drugs amlodipine diovan and hydrochlorothiazide   Review of Systems  Constitutional: Negative for fever and fatigue.  HENT: Negative for hearing loss, congestion, neck pain and postnasal drip.   Eyes: Negative for discharge, redness and visual disturbance.  Respiratory: Negative for cough, shortness of breath and wheezing.   Cardiovascular: Negative for leg swelling.  Gastrointestinal: Negative for abdominal pain, constipation and abdominal distention.  Genitourinary: Negative for urgency and frequency.  Musculoskeletal: Negative for joint swelling and arthralgias.  Skin: Negative for color change and rash.  Neurological: Negative for weakness and light-headedness.  Hematological: Negative for adenopathy.  Psychiatric/Behavioral: Negative for behavioral problems.   Past Medical History  Diagnosis Date  . Hypertension   . ED (erectile dysfunction)   . Calcium oxalate renal stones   . Diverticulosis   . Nail fungal infection    Past Surgical History  Procedure Date  . Skin cancer   . Tonsillectomy 1974  . Lymph node disection 1981  . Melanoma removal of back  1974  . Left leg stripping   . Hernia repair     reports that he has never smoked. He has never used smokeless tobacco. He reports that he drinks alcohol. He reports that he does not use illicit drugs. family history includes Colon cancer in his father and paternal uncle and Hypertension in an unspecified family member. No Known Allergies      Objective:   Physical Exam  Nursing note and vitals  reviewed. Constitutional: He appears well-developed and well-nourished.  HENT:  Head: Normocephalic and atraumatic.  Eyes: Conjunctivae are normal. Pupils are equal, round, and reactive to light.  Neck: Normal range of motion. Neck supple.  Cardiovascular: Normal rate and regular rhythm.   Pulmonary/Chest: Effort normal and breath sounds normal.  Abdominal: Soft. Bowel sounds are normal.          Assessment & Plan:  We discussed time for the flu vaccination which is due at this time.  Blood pressure is stable monitoring of a basic metabolic panel a lipid panel and due to the history of osteopenia her vitamin D and calcium level Has appointment with ophthalmology for monitoring of cataracts.  Has perceived hearing loss and should be referred to cardiology for hearing testing.  Her vitamin D level will be checked today 4 history of osteopenia physical will be scheduled in 6 months time

## 2011-10-07 ENCOUNTER — Encounter: Payer: Self-pay | Admitting: Internal Medicine

## 2011-11-18 ENCOUNTER — Telehealth: Payer: Self-pay | Admitting: Family Medicine

## 2011-11-18 NOTE — Telephone Encounter (Signed)
Pulled from Triage vmail. Wife states husband had flu shot, but think he has flu. Would like suggestions - Tamaflu, etc. Please call & advise.

## 2011-11-19 MED ORDER — OSELTAMIVIR PHOSPHATE 75 MG PO CAPS: 75.0000 mg | ORAL_CAPSULE | Freq: Two times a day (BID) | ORAL | Status: AC

## 2011-11-19 NOTE — Telephone Encounter (Signed)
rx sent and patient is aware 

## 2011-11-19 NOTE — Telephone Encounter (Signed)
Per dr Lovell Sheehan - may have tamiflu 75 bid for 7 days

## 2012-01-07 ENCOUNTER — Other Ambulatory Visit: Payer: Self-pay | Admitting: Internal Medicine

## 2012-01-08 ENCOUNTER — Other Ambulatory Visit: Payer: Self-pay | Admitting: *Deleted

## 2012-01-08 MED ORDER — OLMESARTAN-AMLODIPINE-HCTZ 40-10-25 MG PO TABS: 1.0000 | ORAL_TABLET | Freq: Every day | ORAL | Status: DC

## 2012-02-26 ENCOUNTER — Other Ambulatory Visit (INDEPENDENT_AMBULATORY_CARE_PROVIDER_SITE_OTHER): Payer: Medicare Other

## 2012-02-26 DIAGNOSIS — I10 Essential (primary) hypertension: Secondary | ICD-10-CM

## 2012-02-26 DIAGNOSIS — Z125 Encounter for screening for malignant neoplasm of prostate: Secondary | ICD-10-CM

## 2012-02-26 DIAGNOSIS — Z0000 Encounter for general adult medical examination without abnormal findings: Secondary | ICD-10-CM

## 2012-02-26 LAB — POCT URINALYSIS DIPSTICK
Protein, UA: NEGATIVE
Spec Grav, UA: 1.015
Urobilinogen, UA: 0.2
pH, UA: 7

## 2012-02-26 LAB — CBC WITH DIFFERENTIAL/PLATELET
Basophils Absolute: 0.1 10*3/uL (ref 0.0–0.1)
Lymphocytes Relative: 25.7 % (ref 12.0–46.0)
Monocytes Relative: 9.2 % (ref 3.0–12.0)
Platelets: 180 10*3/uL (ref 150.0–400.0)
RDW: 12.8 % (ref 11.5–14.6)

## 2012-02-26 LAB — LIPID PANEL
Cholesterol: 171 mg/dL (ref 0–200)
LDL Cholesterol: 99 mg/dL (ref 0–99)
VLDL: 19.8 mg/dL (ref 0.0–40.0)

## 2012-02-26 LAB — BASIC METABOLIC PANEL
BUN: 18 mg/dL (ref 6–23)
Calcium: 9.4 mg/dL (ref 8.4–10.5)
Creatinine, Ser: 0.7 mg/dL (ref 0.4–1.5)
GFR: 112.53 mL/min (ref >60.00)
Glucose, Bld: 96 mg/dL (ref 70–99)

## 2012-02-26 LAB — HEPATIC FUNCTION PANEL
AST: 25 U/L (ref 0–37)
Alkaline Phosphatase: 54 U/L (ref 39–117)
Total Bilirubin: 1 mg/dL (ref 0.3–1.2)

## 2012-02-26 LAB — PSA: PSA: 1.61 ng/mL (ref 0.10–4.00)

## 2012-03-04 ENCOUNTER — Ambulatory Visit (INDEPENDENT_AMBULATORY_CARE_PROVIDER_SITE_OTHER): Payer: Medicare Other | Admitting: Internal Medicine

## 2012-03-04 ENCOUNTER — Encounter: Payer: Self-pay | Admitting: Internal Medicine

## 2012-03-04 VITALS — BP 130/80 | HR 72 | Temp 98.3°F | Resp 16 | Ht 72.0 in | Wt 200.0 lb

## 2012-03-04 DIAGNOSIS — I10 Essential (primary) hypertension: Secondary | ICD-10-CM

## 2012-03-04 DIAGNOSIS — Z0000 Encounter for general adult medical examination without abnormal findings: Secondary | ICD-10-CM

## 2012-03-04 NOTE — Progress Notes (Signed)
Subjective:    Patient ID: Wesley Fuller, male    DOB: March 02, 1941, 71 y.o.   MRN: [id number]  HPICPX Patient is a 71 year old white male who presents for an annual examination.  Has a history of hypertension varicose veins to lower extremities for which she wears compression hose and a history of reflux.  Has has a history of mild erectile dysfunction.  He has been stable he has had no orthopnea no chest pain shortness of breath. He does admit that his exercise has decreased to 4 months and he recognizes that this would have an impact on his cholesterol   Review of Systems  Constitutional: Negative for fever and fatigue.  HENT: Negative for hearing loss, congestion, neck pain and postnasal drip.   Eyes: Negative for discharge, redness and visual disturbance.  Respiratory: Negative for cough, shortness of breath and wheezing.   Cardiovascular: Negative for leg swelling.  Gastrointestinal: Negative for abdominal pain, constipation and abdominal distention.  Genitourinary: Negative for urgency and frequency.  Musculoskeletal: Negative for joint swelling and arthralgias.  Skin: Negative for color change and rash.  Neurological: Negative for weakness and light-headedness.  Hematological: Negative for adenopathy.  Psychiatric/Behavioral: Negative for behavioral problems.   Past Medical History  Diagnosis Date  . Hypertension   . ED (erectile dysfunction)   . Calcium oxalate renal stones   . Diverticulosis   . Nail fungal infection     History   Social History  . Marital Status: Married    Spouse Name: N/A    Number of Children: N/A  . Years of Education: N/A   Occupational History  . Not on file.   Social History Main Topics  . Smoking status: Never Smoker   . Smokeless tobacco: Never Used  . Alcohol Use: Yes     moderate  . Drug Use: No  . Sexually Active: Not on file   Other Topics Concern  . Not on file   Social History Narrative  . No narrative on file     Past Surgical History  Procedure Date  . Skin cancer   . Tonsillectomy 1974  . Lymph node disection 1981  . Melanoma removal of back  1974  . Left leg stripping   . Hernia repair     Family History  Problem Relation Age of Onset  . Colon cancer Father   . Colon cancer Paternal Uncle   . Hypertension      No Known Allergies  Current Outpatient Prescriptions on File Prior to Visit  Medication Sig Dispense Refill  . Amlodipine-Valsartan-HCTZ (EXFORGE HCT) 10-320-25 MG TABS Take by mouth daily.      Marland Kitchen ascorbic Acid (VITAMIN C) 500 MG CPCR Take 500 mg by mouth daily.        Marland Kitchen aspirin 81 MG tablet Take 81 mg by mouth daily.        . calcium-vitamin D (OSCAL WITH D) 500-200 MG-UNIT per tablet Take 2 tablets by mouth daily.        . Multiple Vitamins-Minerals (ICAPS MV PO) Take by mouth daily.        Marland Kitchen omeprazole (PRILOSEC) 40 MG capsule TAKE 1 CAPSULE BY MOUTH ONCE A DAY 30 MINUTES BEFORE A MEAL  90 capsule  2  . sildenafil (VIAGRA) 100 MG tablet Take 1 tablet (100 mg total) by mouth daily as needed.  10 tablet  11   Current Facility-Administered Medications on File Prior to Visit  Medication Dose Route Frequency Provider Last Rate Last  Dose  . methylPREDNISolone acetate (DEPO-MEDROL) injection 40 mg  40 mg Intra-articular Once Stacie Glaze, MD        BP 130/80  Pulse 72  Temp 98.3 F (36.8 C)  Resp 16  Ht 6' (1.829 m)  Wt 200 lb (90.719 kg)  BMI 27.12 kg/m2       Objective:   Physical Exam  Constitutional: He is oriented to person, place, and time. He appears well-developed and well-nourished.  HENT:  Head: Normocephalic and atraumatic.  Eyes: Conjunctivae are normal. Pupils are equal, round, and reactive to light.  Neck: Normal range of motion. Neck supple.  Cardiovascular: Normal rate and regular rhythm.   Pulmonary/Chest: Effort normal and breath sounds normal.  Abdominal: Soft. Bowel sounds are normal.  Genitourinary: Rectum normal and prostate normal.   Musculoskeletal: Normal range of motion.  Neurological: He is alert and oriented to person, place, and time.  Skin: Skin is warm and dry.  Psychiatric: He has a normal mood and affect. His behavior is normal.          Assessment & Plan:   Patient presents for yearly preventative medicine examination.   all immunizations and health maintenance protocols were reviewed with the patient and they are up to date with these protocols.   screening laboratory values were reviewed with the patient including screening of hyperlipidemia PSA renal function and hepatic function.   There medications past medical history social history problem list and allergies were reviewed in detail.   Goals were established with regard to weight loss exercise diet in compliance with medications

## 2012-03-04 NOTE — Patient Instructions (Signed)
The patient is instructed to continue all medications as prescribed. Schedule followup with check out clerk upon leaving the clinic  

## 2012-09-03 ENCOUNTER — Ambulatory Visit: Payer: Medicare Other | Admitting: Internal Medicine

## 2012-09-08 ENCOUNTER — Encounter: Payer: Self-pay | Admitting: Internal Medicine

## 2012-09-08 ENCOUNTER — Ambulatory Visit (INDEPENDENT_AMBULATORY_CARE_PROVIDER_SITE_OTHER): Payer: Medicare Other | Admitting: Internal Medicine

## 2012-09-08 VITALS — BP 140/80 | HR 72 | Temp 98.2°F | Resp 16 | Ht 72.0 in | Wt 202.0 lb

## 2012-09-08 DIAGNOSIS — I10 Essential (primary) hypertension: Secondary | ICD-10-CM

## 2012-09-08 DIAGNOSIS — F528 Other sexual dysfunction not due to a substance or known physiological condition: Secondary | ICD-10-CM

## 2012-09-08 DIAGNOSIS — Z23 Encounter for immunization: Secondary | ICD-10-CM

## 2012-09-08 DIAGNOSIS — N529 Male erectile dysfunction, unspecified: Secondary | ICD-10-CM

## 2012-09-08 DIAGNOSIS — K219 Gastro-esophageal reflux disease without esophagitis: Secondary | ICD-10-CM

## 2012-09-08 LAB — BASIC METABOLIC PANEL
BUN: 16 mg/dL (ref 6–23)
CO2: 30 mEq/L (ref 19–32)
Chloride: 98 mEq/L (ref 96–112)
Creatinine, Ser: 1 mg/dL (ref 0.4–1.5)
Glucose, Bld: 96 mg/dL (ref 70–99)

## 2012-09-08 MED ORDER — SILDENAFIL CITRATE 100 MG PO TABS: 100.0000 mg | ORAL_TABLET | Freq: Every day | ORAL | Status: DC | PRN

## 2012-09-08 NOTE — Patient Instructions (Signed)
The patient is instructed to continue all medications as prescribed. Schedule followup with check out clerk upon leaving the clinic  

## 2012-09-08 NOTE — Progress Notes (Signed)
Subjective:    Patient ID: Wesley Fuller, male    DOB: 03-03-1941, 71 y.o.   MRN: [id number]  HPI monitoring for HTN Discussion of timing of medications Discussion of MVI Pain in great tow that improves with walking   Review of Systems  Constitutional: Negative for fever and fatigue.  HENT: Negative for hearing loss, congestion, neck pain and postnasal drip.   Eyes: Negative for discharge, redness and visual disturbance.  Respiratory: Negative for cough, shortness of breath and wheezing.   Cardiovascular: Negative for leg swelling.  Gastrointestinal: Negative for abdominal pain, constipation and abdominal distention.  Genitourinary: Negative for urgency and frequency.  Musculoskeletal: Negative for joint swelling and arthralgias.  Skin: Negative for color change and rash.  Neurological: Negative for weakness and light-headedness.  Hematological: Negative for adenopathy.  Psychiatric/Behavioral: Negative for behavioral problems.       Past Medical History  Diagnosis Date  . Hypertension   . ED (erectile dysfunction)   . Calcium oxalate renal stones   . Diverticulosis   . Nail fungal infection     History   Social History  . Marital Status: Married    Spouse Name: N/A    Number of Children: N/A  . Years of Education: N/A   Occupational History  . Not on file.   Social History Main Topics  . Smoking status: Never Smoker   . Smokeless tobacco: Never Used  . Alcohol Use: Yes     moderate  . Drug Use: No  . Sexually Active: Not on file   Other Topics Concern  . Not on file   Social History Narrative  . No narrative on file    Past Surgical History  Procedure Date  . Skin cancer   . Tonsillectomy 1974  . Lymph node disection 1981  . Melanoma removal of back  1974  . Left leg stripping   . Hernia repair     Family History  Problem Relation Age of Onset  . Colon cancer Father   . Colon cancer Paternal Uncle   . Hypertension      No Known  Allergies  Current Outpatient Prescriptions on File Prior to Visit  Medication Sig Dispense Refill  . Amlodipine-Valsartan-HCTZ (EXFORGE HCT) 10-320-25 MG TABS Take by mouth daily.      Marland Kitchen ascorbic Acid (VITAMIN C) 500 MG CPCR Take 500 mg by mouth daily.        Marland Kitchen aspirin 81 MG tablet Take 81 mg by mouth daily.        . calcium-vitamin D (OSCAL WITH D) 500-200 MG-UNIT per tablet Take 2 tablets by mouth daily.        . Multiple Vitamins-Minerals (ICAPS MV PO) Take by mouth daily.        Marland Kitchen omeprazole (PRILOSEC) 40 MG capsule TAKE 1 CAPSULE BY MOUTH ONCE A DAY 30 MINUTES BEFORE A MEAL  90 capsule  2  . DISCONTD: sildenafil (VIAGRA) 100 MG tablet Take 1 tablet (100 mg total) by mouth daily as needed.  10 tablet  11   Current Facility-Administered Medications on File Prior to Visit  Medication Dose Route Frequency Provider Last Rate Last Dose  . DISCONTD: methylPREDNISolone acetate (DEPO-MEDROL) injection 40 mg  40 mg Intra-articular Once Stacie Glaze, MD        BP 140/80  Pulse 72  Temp 98.2 F (36.8 C)  Resp 16  Ht 6' (1.829 m)  Wt 202 lb (91.627 kg)  BMI 27.40 kg/m2  Objective:   Physical Exam  Nursing note and vitals reviewed. Constitutional: He appears well-developed and well-nourished.  HENT:  Head: Normocephalic and atraumatic.  Eyes: Conjunctivae normal are normal. Pupils are equal, round, and reactive to light.  Neck: Normal range of motion. Neck supple.  Cardiovascular: Normal rate and regular rhythm.   Pulmonary/Chest: Effort normal and breath sounds normal.  Abdominal: Soft. Bowel sounds are normal.    No redness of the right great toe No induration..       Assessment & Plan:  HTN stable Check BMET for renal function on diuretic Has recurrent OA of the right great toe Discussion of toe  And shoe size

## 2012-11-20 ENCOUNTER — Other Ambulatory Visit: Payer: Self-pay | Admitting: Internal Medicine

## 2013-03-02 ENCOUNTER — Other Ambulatory Visit: Payer: Medicare Other

## 2013-03-09 ENCOUNTER — Encounter: Payer: Medicare Other | Admitting: Internal Medicine

## 2013-06-01 ENCOUNTER — Other Ambulatory Visit (INDEPENDENT_AMBULATORY_CARE_PROVIDER_SITE_OTHER): Payer: Medicare Other

## 2013-06-01 DIAGNOSIS — Z0000 Encounter for general adult medical examination without abnormal findings: Secondary | ICD-10-CM

## 2013-06-01 DIAGNOSIS — I10 Essential (primary) hypertension: Secondary | ICD-10-CM

## 2013-06-01 LAB — HEPATIC FUNCTION PANEL
ALT: 22 U/L (ref 0–53)
AST: 23 U/L (ref 0–37)
Albumin: 3.7 g/dL (ref 3.5–5.2)
Alkaline Phosphatase: 47 U/L (ref 39–117)
Bilirubin, Direct: 0.2 mg/dL (ref 0.0–0.3)
Total Bilirubin: 1.4 mg/dL — ABNORMAL HIGH (ref 0.3–1.2)
Total Protein: 6.1 g/dL (ref 6.0–8.3)

## 2013-06-01 LAB — BASIC METABOLIC PANEL WITH GFR
BUN: 19 mg/dL (ref 6–23)
CO2: 27 meq/L (ref 19–32)
Calcium: 9.5 mg/dL (ref 8.4–10.5)
Chloride: 106 meq/L (ref 96–112)
Creatinine, Ser: 0.9 mg/dL (ref 0.4–1.5)
GFR: 83.75 mL/min
Glucose, Bld: 101 mg/dL — ABNORMAL HIGH (ref 70–99)
Potassium: 4.6 meq/L (ref 3.5–5.1)
Sodium: 144 meq/L (ref 135–145)

## 2013-06-01 LAB — CBC WITH DIFFERENTIAL/PLATELET
Basophils Absolute: 0.1 10*3/uL (ref 0.0–0.1)
Basophils Relative: 1.3 % (ref 0.0–3.0)
Eosinophils Absolute: 0.3 10*3/uL (ref 0.0–0.7)
Eosinophils Relative: 4.3 % (ref 0.0–5.0)
HCT: 48 % (ref 39.0–52.0)
Hemoglobin: 16.3 g/dL (ref 13.0–17.0)
Lymphocytes Relative: 24.7 % (ref 12.0–46.0)
Lymphs Abs: 1.7 10*3/uL (ref 0.7–4.0)
MCHC: 33.8 g/dL (ref 30.0–36.0)
MCV: 97.2 fl (ref 78.0–100.0)
Monocytes Absolute: 0.7 10*3/uL (ref 0.1–1.0)
Monocytes Relative: 10 % (ref 3.0–12.0)
Neutro Abs: 4.2 10*3/uL (ref 1.4–7.7)
Neutrophils Relative %: 59.7 % (ref 43.0–77.0)
Platelets: 168 10*3/uL (ref 150.0–400.0)
RBC: 4.94 Mil/uL (ref 4.22–5.81)
RDW: 12.7 % (ref 11.5–14.6)
WBC: 7.1 10*3/uL (ref 4.5–10.5)

## 2013-06-01 LAB — POCT URINALYSIS DIPSTICK
Bilirubin, UA: NEGATIVE
Blood, UA: NEGATIVE
Glucose, UA: NEGATIVE
Leukocytes, UA: NEGATIVE
Nitrite, UA: NEGATIVE
Urobilinogen, UA: 0.2
pH, UA: 7

## 2013-06-01 LAB — LIPID PANEL
HDL: 52.9 mg/dL (ref >39.00)
Total CHOL/HDL Ratio: 3
Triglycerides: 72 mg/dL (ref 0.0–149.0)

## 2013-06-01 LAB — PSA: PSA: 1.5 ng/mL (ref 0.10–4.00)

## 2013-06-01 LAB — TSH: TSH: 2.21 u[IU]/mL (ref 0.35–5.50)

## 2013-06-07 ENCOUNTER — Ambulatory Visit (INDEPENDENT_AMBULATORY_CARE_PROVIDER_SITE_OTHER): Payer: Medicare Other | Admitting: Internal Medicine

## 2013-06-07 ENCOUNTER — Encounter: Payer: Self-pay | Admitting: Internal Medicine

## 2013-06-07 VITALS — BP 122/78 | HR 72 | Temp 98.2°F | Resp 16 | Ht 72.0 in | Wt 198.0 lb

## 2013-06-07 DIAGNOSIS — J0100 Acute maxillary sinusitis, unspecified: Secondary | ICD-10-CM

## 2013-06-07 DIAGNOSIS — Z0000 Encounter for general adult medical examination without abnormal findings: Secondary | ICD-10-CM

## 2013-06-07 MED ORDER — LEVOFLOXACIN 500 MG PO TABS: 500.0000 mg | ORAL_TABLET | Freq: Every day | ORAL | Status: DC

## 2013-06-07 MED ORDER — METHYLPREDNISOLONE (PAK) 4 MG PO TABS: ORAL_TABLET | ORAL | Status: DC

## 2013-06-07 NOTE — Progress Notes (Signed)
Subjective:    Patient ID: Wesley Fuller, male    DOB: 1941-08-22, 72 y.o.   MRN: [id number]  HPI Patient is a 72 year old male presents for examination.  Who is followed for a history of malignant melanoma hypertension GERD erectile dysfunction and mild to moderate hyperlipidemia.  He is stable on his current medications but has a chief complaint of sinus pressure in his left maxillary sinus is gone on for several weeks.  He states he has congestion on the left side  Review of Systems  Constitutional: Negative for fever and fatigue.  HENT: Positive for congestion, postnasal drip and sinus pressure. Negative for hearing loss and neck pain.   Eyes: Negative for discharge, redness and visual disturbance.  Respiratory: Negative for cough, shortness of breath and wheezing.   Cardiovascular: Negative for leg swelling.  Gastrointestinal: Negative for abdominal pain, constipation and abdominal distention.  Genitourinary: Negative for urgency and frequency.  Musculoskeletal: Negative for joint swelling and arthralgias.  Skin: Negative for color change and rash.  Neurological: Negative for weakness and light-headedness.  Hematological: Negative for adenopathy.  Psychiatric/Behavioral: Negative for behavioral problems.   Past Medical History  Diagnosis Date  . Hypertension   . ED (erectile dysfunction)   . Calcium oxalate renal stones   . Diverticulosis   . Nail fungal infection   . Cataract 2013    History   Social History  . Marital Status: Married    Spouse Name: N/A    Number of Children: N/A  . Years of Education: N/A   Occupational History  . Not on file.   Social History Main Topics  . Smoking status: Never Smoker   . Smokeless tobacco: Never Used  . Alcohol Use: Yes     Comment: moderate  . Drug Use: No  . Sexually Active: Yes   Other Topics Concern  . Not on file   Social History Narrative  . No narrative on file    Past Surgical History  Procedure  Laterality Date  . Skin cancer    . Tonsillectomy  1974  . Lymph node disection  1981  . Melanoma removal of back   1974  . Left leg stripping    . Hernia repair      Family History  Problem Relation Age of Onset  . Colon cancer Father   . Colon cancer Paternal Uncle   . Hypertension      No Known Allergies  Current Outpatient Prescriptions on File Prior to Visit  Medication Sig Dispense Refill  . ascorbic Acid (VITAMIN C) 500 MG CPCR Take 500 mg by mouth daily.        Marland Kitchen aspirin 81 MG tablet Take 81 mg by mouth daily.        . calcium-vitamin D (OSCAL WITH D) 500-200 MG-UNIT per tablet Take 2 tablets by mouth daily.        Marland Kitchen EXFORGE HCT 10-320-25 MG TABS TAKE 1 TABLET BY MOUTH ONCE A DAY  90 each  3  . Multiple Vitamins-Minerals (ICAPS MV PO) Take by mouth daily.        Marland Kitchen omeprazole (PRILOSEC) 40 MG capsule TAKE 1 CAPSULE BY MOUTH ONCE A DAY 30 MINUTES BEFORE A MEAL  90 capsule  2  . sildenafil (VIAGRA) 100 MG tablet Take 1 tablet (100 mg total) by mouth daily as needed.  10 tablet  11   No current facility-administered medications on file prior to visit.    BP 122/78  Pulse 72  Temp(Src) 98.2 F (36.8 C)  Resp 16  Ht 6' (1.829 m)  Wt 198 lb (89.812 kg)  BMI 26.85 kg/m2       Objective:   Physical Exam  Nursing note and vitals reviewed. Constitutional: He is oriented to person, place, and time. He appears well-developed and well-nourished.  HENT:  Head: Normocephalic and atraumatic.  Tenderness over the left maxillary sinus mucopurulent discharge coming down but indeterminate on the left with significant hyperemia  Eyes: Conjunctivae are normal. Pupils are equal, round, and reactive to light.  Neck: Normal range of motion. Neck supple.  Cardiovascular: Normal rate and regular rhythm.   Pulmonary/Chest: Effort normal and breath sounds normal.  Abdominal: Soft. Bowel sounds are normal.  Genitourinary: Rectum normal and prostate normal.  Musculoskeletal: Normal  range of motion.  Neurological: He is alert and oriented to person, place, and time.  Skin: Skin is warm and dry.          Assessment & Plan:    Patient presents for yearly preventative medicine examination.   all immunizations and health maintenance protocols were reviewed with the patient and they are up to date with these protocols.   screening laboratory values were reviewed with the patient including screening of hyperlipidemia PSA renal function and hepatic function.   There medications past medical history social history problem list and allergies were reviewed in detail.   Goals were established with regard to weight loss exercise diet in compliance with medications  Acute/chronic maxillary sinusitis treat with 14 days of levofloxacin Mucinex and a Medrol Dosepak

## 2013-06-08 ENCOUNTER — Encounter: Payer: Self-pay | Admitting: Internal Medicine

## 2013-06-09 ENCOUNTER — Encounter: Payer: Self-pay | Admitting: Internal Medicine

## 2013-06-21 ENCOUNTER — Encounter: Payer: Self-pay | Admitting: Internal Medicine

## 2013-06-29 ENCOUNTER — Telehealth: Payer: Self-pay | Admitting: Internal Medicine

## 2013-06-29 DIAGNOSIS — J329 Chronic sinusitis, unspecified: Secondary | ICD-10-CM

## 2013-06-29 NOTE — Telephone Encounter (Signed)
Per dr Lovell Sheehan- have sinus ct-Left message on machine For pt and will send order to [facility]

## 2013-06-29 NOTE — Telephone Encounter (Signed)
Pt was RX'd methylPREDNIsolone (MEDROL DOSPACK) 4 MG tablet and levofloxacin (LEVAQUIN) 500 MG tablet on 6/16. Pt states he is taking Mucinex  occasionally.  Pt states he is not making any progress getting over this sinus infection. Pt would like to know where to proceed from here. PharmBryson Dames    OK to leave message on [phone]

## 2013-06-29 NOTE — Telephone Encounter (Signed)
Pt informed doc out of office until tomorrow- will ask in am- but for now, take mucinex on a routine basis

## 2013-07-01 ENCOUNTER — Other Ambulatory Visit: Payer: Self-pay | Admitting: *Deleted

## 2013-07-01 DIAGNOSIS — J329 Chronic sinusitis, unspecified: Secondary | ICD-10-CM

## 2013-07-01 MED ORDER — CEFDINIR 300 MG PO CAPS: 300.0000 mg | ORAL_CAPSULE | Freq: Two times a day (BID) | ORAL | Status: DC

## 2013-07-01 NOTE — Addendum Note (Signed)
Addended by: Willy Eddy on: 07/01/2013 11:27 AM   Modules accepted: Orders, Medications

## 2013-07-01 NOTE — Telephone Encounter (Signed)
[facility] denied ct scan.  Per dr Lovell Sheehan

## 2013-07-01 NOTE — Telephone Encounter (Signed)
[facility] denied ct.  Per dr Lovell Sheehan- get plain sinus films and start omniceff 300 bid for 14 dats. Left message on machine for pt and med called in and order put in for xray

## 2013-07-05 ENCOUNTER — Ambulatory Visit (INDEPENDENT_AMBULATORY_CARE_PROVIDER_SITE_OTHER)
Admission: RE | Admit: 2013-07-05 | Discharge: 2013-07-05 | Disposition: A | Discharge status: Home / Self Care | Payer: Medicare Other | Source: Ambulatory Visit | Attending: Internal Medicine | Admitting: Internal Medicine

## 2013-07-05 DIAGNOSIS — J329 Chronic sinusitis, unspecified: Secondary | ICD-10-CM

## 2013-07-13 ENCOUNTER — Telehealth: Payer: Self-pay | Admitting: *Deleted

## 2013-07-13 ENCOUNTER — Encounter: Payer: Self-pay | Admitting: Internal Medicine

## 2013-07-13 DIAGNOSIS — J329 Chronic sinusitis, unspecified: Secondary | ICD-10-CM

## 2013-07-13 NOTE — Telephone Encounter (Signed)
done

## 2013-08-05 ENCOUNTER — Other Ambulatory Visit: Payer: Self-pay | Admitting: Otolaryngology

## 2013-08-24 ENCOUNTER — Other Ambulatory Visit: Payer: Self-pay | Admitting: Internal Medicine

## 2013-10-06 ENCOUNTER — Ambulatory Visit (INDEPENDENT_AMBULATORY_CARE_PROVIDER_SITE_OTHER): Payer: Medicare Other | Admitting: Internal Medicine

## 2013-10-06 ENCOUNTER — Encounter: Payer: Self-pay | Admitting: Internal Medicine

## 2013-10-06 VITALS — BP 140/80 | HR 72 | Temp 98.2°F | Resp 16 | Ht 72.0 in | Wt 200.0 lb

## 2013-10-06 DIAGNOSIS — S6990XA Unspecified injury of unspecified wrist, hand and finger(s), initial encounter: Secondary | ICD-10-CM

## 2013-10-06 DIAGNOSIS — S59901A Unspecified injury of right elbow, initial encounter: Secondary | ICD-10-CM

## 2013-10-06 DIAGNOSIS — Z23 Encounter for immunization: Secondary | ICD-10-CM

## 2013-10-06 DIAGNOSIS — M7130 Other bursal cyst, unspecified site: Secondary | ICD-10-CM

## 2013-10-06 DIAGNOSIS — S59909A Unspecified injury of unspecified elbow, initial encounter: Secondary | ICD-10-CM

## 2013-10-06 DIAGNOSIS — M6749 Ganglion, multiple sites: Secondary | ICD-10-CM

## 2013-10-06 NOTE — Progress Notes (Signed)
Subjective:    Patient ID: Wesley Fuller, male    DOB: 03-30-1941, 72 y.o.   MRN: [id number]  HPI Had an injury to the right elbow and developed a hematoma that has progresses toa synovial cyst and fluid accumulation. No heat or erythema Pain 2/10 No clotting disorders Takes an aspirin Blood pressure and GERD are stable  Review of Systems  Constitutional: Negative for fever and fatigue.  HENT: Negative for congestion, hearing loss and postnasal drip.   Eyes: Negative for discharge, redness and visual disturbance.  Respiratory: Negative for cough, shortness of breath and wheezing.   Cardiovascular: Negative for leg swelling.  Gastrointestinal: Negative for abdominal pain, constipation and abdominal distention.  Genitourinary: Negative for urgency and frequency.  Musculoskeletal: Positive for joint swelling. Negative for arthralgias and neck pain.  Skin: Negative for color change and rash.  Neurological: Negative for weakness and light-headedness.  Hematological: Negative for adenopathy.  Psychiatric/Behavioral: Negative for behavioral problems.   Past Medical History  Diagnosis Date  . Hypertension   . ED (erectile dysfunction)   . Calcium oxalate renal stones   . Diverticulosis   . Nail fungal infection   . Cataract 2013    History   Social History  . Marital Status: Married    Spouse Name: N/A    Number of Children: N/A  . Years of Education: N/A   Occupational History  . Not on file.   Social History Main Topics  . Smoking status: Never Smoker   . Smokeless tobacco: Never Used  . Alcohol Use: Yes     Comment: moderate  . Drug Use: No  . Sexual Activity: Yes   Other Topics Concern  . Not on file   Social History Narrative  . No narrative on file    Past Surgical History  Procedure Laterality Date  . Skin cancer    . Tonsillectomy  1974  . Lymph node disection  1981  . Melanoma removal of back   1974  . Left leg stripping    . Hernia repair       Family History  Problem Relation Age of Onset  . Colon cancer Father   . Colon cancer Paternal Uncle   . Hypertension      No Known Allergies  Current Outpatient Prescriptions on File Prior to Visit  Medication Sig Dispense Refill  . ascorbic Acid (VITAMIN C) 500 MG CPCR Take 500 mg by mouth daily.        Marland Kitchen aspirin 81 MG tablet Take 81 mg by mouth daily.        . calcium-vitamin D (OSCAL WITH D) 500-200 MG-UNIT per tablet Take 2 tablets by mouth daily.        . cefdinir (OMNICEF) 300 MG capsule Take 1 capsule (300 mg total) by mouth 2 (two) times daily.  28 capsule  0  . EXFORGE HCT 10-320-25 MG TABS TAKE 1 TABLET BY MOUTH ONCE A DAY  90 each  3  . Multiple Vitamins-Minerals (ICAPS MV PO) Take by mouth daily.        Marland Kitchen omeprazole (PRILOSEC) 40 MG capsule TAKE 1 CAPSULE BY MOUTH 30 MINUTES BEFORE A MEAL ONCE A DAY  90 capsule  2  . sildenafil (VIAGRA) 100 MG tablet Take 1 tablet (100 mg total) by mouth daily as needed.  10 tablet  11   No current facility-administered medications on file prior to visit.    BP 140/80  Pulse 72  Temp(Src) 98.2 F (36.8  C)  Resp 16  Ht 6' (1.829 m)  Wt 200 lb (90.719 kg)  BMI 27.12 kg/m2        Objective:   Physical Exam  Nursing note and vitals reviewed. Constitutional: He appears well-developed and well-nourished.  HENT:  Head: Normocephalic and atraumatic.  Eyes: Conjunctivae are normal. Pupils are equal, round, and reactive to light.  Neck: Normal range of motion. Neck supple.  Cardiovascular: Normal rate and regular rhythm.   Pulmonary/Chest: Effort normal and breath sounds normal.  Abdominal: Soft. Bowel sounds are normal.  Musculoskeletal:  Synovial cyst at right olecranon bursa          Assessment & Plan:   Informed consent obtained and the patient's right elbow was prepped with betadine. Local anesthesia was obtained with topical spray. Then  1/2 cc of lidocaine was injected into the joint space. Approximately 30 cc  of serosanguineous fluid was aspirated from the joint using a 50 cc syringe and a 20-gauge needle.The patient tolerated the procedure without complications. Post injection care discussed with patient. After care discussed and coban dressing placed  Stable HTN Stable GERD

## 2013-11-20 ENCOUNTER — Other Ambulatory Visit: Payer: Self-pay | Admitting: Internal Medicine

## 2013-12-03 ENCOUNTER — Encounter: Payer: Self-pay | Admitting: Gastroenterology

## 2013-12-08 ENCOUNTER — Encounter: Payer: Self-pay | Admitting: Internal Medicine

## 2013-12-08 ENCOUNTER — Ambulatory Visit (INDEPENDENT_AMBULATORY_CARE_PROVIDER_SITE_OTHER): Payer: Medicare Other | Admitting: Internal Medicine

## 2013-12-08 VITALS — BP 130/80 | HR 80 | Temp 98.2°F | Resp 16 | Ht 72.0 in | Wt 200.0 lb

## 2013-12-08 DIAGNOSIS — F528 Other sexual dysfunction not due to a substance or known physiological condition: Secondary | ICD-10-CM

## 2013-12-08 DIAGNOSIS — I8310 Varicose veins of unspecified lower extremity with inflammation: Secondary | ICD-10-CM

## 2013-12-08 DIAGNOSIS — I10 Essential (primary) hypertension: Secondary | ICD-10-CM

## 2013-12-08 NOTE — Progress Notes (Signed)
Pre visit review using our clinic review tool, if applicable. No additional management support is needed unless otherwise documented below in the visit note. 

## 2013-12-08 NOTE — Progress Notes (Signed)
Subjective:    Patient ID: Wesley Fuller, male    DOB: 1941-02-17, 72 y.o.   MRN: [id number]  HPI Blood pressure stable Nose and sinus improved GERD  Colon due buit since has no stricture symptoms do not see the need foe EGD   Review of Systems  Constitutional: Negative for fever and fatigue.  HENT: Negative for congestion, hearing loss and postnasal drip.   Eyes: Negative for discharge, redness and visual disturbance.  Respiratory: Negative for cough, shortness of breath and wheezing.   Cardiovascular: Negative for leg swelling.  Gastrointestinal: Negative for abdominal pain, constipation and abdominal distention.  Genitourinary: Negative for urgency and frequency.  Musculoskeletal: Negative for arthralgias, joint swelling and neck pain.  Skin: Negative for color change and rash.  Neurological: Negative for weakness and light-headedness.  Hematological: Negative for adenopathy.  Psychiatric/Behavioral: Negative for behavioral problems.       Past Medical History  Diagnosis Date  . Hypertension   . ED (erectile dysfunction)   . Calcium oxalate renal stones   . Diverticulosis   . Nail fungal infection   . Cataract 2013    History   Social History  . Marital Status: Married    Spouse Name: N/A    Number of Children: N/A  . Years of Education: N/A   Occupational History  . Not on file.   Social History Main Topics  . Smoking status: Never Smoker   . Smokeless tobacco: Never Used  . Alcohol Use: Yes     Comment: moderate  . Drug Use: No  . Sexual Activity: Yes   Other Topics Concern  . Not on file   Social History Narrative  . No narrative on file    Past Surgical History  Procedure Laterality Date  . Skin cancer    . Tonsillectomy  1974  . Lymph node disection  1981  . Melanoma removal of back   1974  . Left leg stripping    . Hernia repair      Family History  Problem Relation Age of Onset  . Colon cancer Father   . Colon cancer Paternal  Uncle   . Hypertension      No Known Allergies  Current Outpatient Prescriptions on File Prior to Visit  Medication Sig Dispense Refill  . ascorbic Acid (VITAMIN C) 500 MG CPCR Take 500 mg by mouth daily.        Marland Kitchen aspirin 81 MG tablet Take 81 mg by mouth daily.        . calcium-vitamin D (OSCAL WITH D) 500-200 MG-UNIT per tablet Take 2 tablets by mouth daily.        Marland Kitchen EXFORGE HCT 10-320-25 MG TABS TAKE 1 TABLET BY MOUTH ONCE A DAY  90 each  0  . Multiple Vitamins-Minerals (ICAPS MV PO) Take by mouth daily.        Marland Kitchen omeprazole (PRILOSEC) 40 MG capsule TAKE 1 CAPSULE BY MOUTH 30 MINUTES BEFORE A MEAL ONCE A DAY  90 capsule  2  . sildenafil (VIAGRA) 100 MG tablet Take 1 tablet (100 mg total) by mouth daily as needed.  10 tablet  11   No current facility-administered medications on file prior to visit.    BP 130/80  Pulse 80  Temp(Src) 98.2 F (36.8 C)  Resp 16  Ht 6' (1.829 m)  Wt 200 lb (90.719 kg)  BMI 27.12 kg/m2    Objective:   Physical Exam  Constitutional: He is oriented to person,  place, and time. He appears well-developed and well-nourished.  HENT:  Head: Normocephalic and atraumatic.  Eyes: Conjunctivae are normal. Pupils are equal, round, and reactive to light.  Neck: Normal range of motion. Neck supple.  Cardiovascular: Normal rate and regular rhythm.   Pulmonary/Chest: Effort normal and breath sounds normal.  Abdominal: Soft. Bowel sounds are normal.  Neurological: He is alert and oriented to person, place, and time.          Assessment & Plan:  Stable of 40 of prilosec for GERD with need to continue PPI due to hx of strictureformation with GERD No current symptoms  Acute/ chronic injury to knee after playing foot ball with grand kids symptoms of a meniscal tear  Stable HTN

## 2013-12-10 ENCOUNTER — Encounter: Payer: Self-pay | Admitting: Gastroenterology

## 2014-01-25 ENCOUNTER — Ambulatory Visit (AMBULATORY_SURGERY_CENTER): Payer: Self-pay | Admitting: *Deleted

## 2014-01-25 VITALS — Ht 72.0 in | Wt 204.0 lb

## 2014-01-25 DIAGNOSIS — Z800 Family history of malignant neoplasm of digestive organs: Secondary | ICD-10-CM

## 2014-01-25 MED ORDER — MOVIPREP 100 G PO SOLR: 1.0000 | Freq: Once | ORAL | Status: DC

## 2014-01-25 NOTE — Progress Notes (Signed)
No egg or soy allergy. No anesthesia problems.  

## 2014-01-27 ENCOUNTER — Encounter: Payer: Self-pay | Admitting: Gastroenterology

## 2014-02-08 ENCOUNTER — Encounter: Payer: Medicare Other | Admitting: Gastroenterology

## 2014-02-10 ENCOUNTER — Ambulatory Visit (AMBULATORY_SURGERY_CENTER): Payer: Medicare HMO | Admitting: Gastroenterology

## 2014-02-10 ENCOUNTER — Encounter: Payer: Self-pay | Admitting: Gastroenterology

## 2014-02-10 VITALS — BP 137/75 | HR 58 | Temp 97.2°F | Resp 20 | Ht 72.0 in | Wt 204.0 lb

## 2014-02-10 DIAGNOSIS — D126 Benign neoplasm of colon, unspecified: Secondary | ICD-10-CM

## 2014-02-10 DIAGNOSIS — Z800 Family history of malignant neoplasm of digestive organs: Secondary | ICD-10-CM

## 2014-02-10 DIAGNOSIS — Z1211 Encounter for screening for malignant neoplasm of colon: Secondary | ICD-10-CM

## 2014-02-10 MED ORDER — SODIUM CHLORIDE 0.9 % IV SOLN: 500.0000 mL | INTRAVENOUS | Status: DC

## 2014-02-10 NOTE — Progress Notes (Signed)
Called to room to assist during endoscopic procedure.  Patient ID and intended procedure confirmed with present staff. Received instructions for my participation in the procedure from the performing physician.  

## 2014-02-10 NOTE — Progress Notes (Signed)
Discharge instructions given with verbal understanding.  Handouts on polyps,diverticulosis and hemorrhoids.  Resume previous medications.  The above information was put on this pt's AVS by mistake.  The AVS was corrected and the correct AVS was given to pt per C McCoy RN

## 2014-02-10 NOTE — Progress Notes (Signed)
NO EGG OR SOY ALLERGY EWM NO PROBLEMS WITH PAST SEDATION. EWM 

## 2014-02-10 NOTE — Op Note (Signed)
[place]  [address]. [place], [address]   COLONOSCOPY PROCEDURE REPORT PATIENT: [name], Wesley Fuller  MR#: [id number] BIRTHDATE: 13-May-1941 , 73  yrs. old GENDER: Male ENDOSCOPIST: Ladene Artist, MD, [facility] PROCEDURE DATE:  02/10/2014 PROCEDURE:   Colonoscopy with biopsy and snare polypectomy First Screening Colonoscopy - Avg.  risk and is 50 yrs.  old or older - No.  Prior Negative Screening - Now for repeat screening. N/A  History of Adenoma - Now for follow-up colonoscopy & has been > or = to 3 yrs.  N/A  Polyps Removed Today? Yes. ASA CLASS:   Class II INDICATIONS:elevated risk screening, Patient's immediate family and distant relatives history of colon cancer. MEDICATIONS: MAC sedation, administered by CRNA and propofol (Diprivan) 250mg  IV DESCRIPTION OF PROCEDURE:   After the risks benefits and alternatives of the procedure were thoroughly explained, informed consent was obtained.  A digital rectal exam revealed no abnormalities of the rectum.   The LB [id number]  endoscope was introduced through the anus and advanced to the cecum, which was identified by both the appendix and ileocecal valve. No adverse events experienced.   The quality of the prep was good, using MoviPrep  The instrument was then slowly withdrawn as the colon was fully examined.  COLON FINDINGS: A sessile polyp measuring 6 mm in size was found at the cecum.  A polypectomy was performed with a cold snare.  The resection was complete and the polyp tissue was completely retrieved.   A sessile polyp measuring 4 mm in size was found in the ascending colon.  A polypectomy was performed with cold forceps.  The resection was complete and the polyp tissue was completely retrieved.   A sessile polyp measuring 5 mm in size was found in the transverse colon.  A polypectomy was performed with a cold snare.  The resection was complete and the polyp tissue was completely retrieved.   Mild  diverticulosis was noted in the sigmoid colon.   The colon was otherwise normal.  There was no diverticulosis, inflammation, polyps or cancers unless previously stated.  Retroflexed views revealed external hemorrhoids. The time to cecum=3 minutes 18 seconds.  Withdrawal time=14 minutes 13 seconds.  The scope was withdrawn and the procedure completed. COMPLICATIONS: There were no complications. ENDOSCOPIC IMPRESSION: 1.   Sessile polyp measuring 6 mm at the cecum; polypectomy performed with a cold snare 2.   Sessile polyp measuring 4 mm in the ascending colon; polypectomy performed with cold forceps 3.   Sessile polyp measuring 5 mm in the transverse colon; polypectomy performed with a cold snare 4.   Mild diverticulosis in the sigmoid colon 5.   External hemorrhoids RECOMMENDATIONS: 1.  Await pathology results 2.  High fiber diet with liberal fluid intake. 3.  Repeat Colonoscopy in 5 years.  eSigned:  Ladene Artist, MD, [facility] 02/10/2014 11:55 AM

## 2014-02-10 NOTE — Patient Instructions (Addendum)
Discharge instructions given with verbal understanding. Handouts on polyps,diverticulosis and hemorrhoids. Resume previous medications. YOU HAD AN ENDOSCOPIC PROCEDURE TODAY AT THE [facility]: Refer to the procedure report that was given to you for any specific questions about what was found during the examination.  If the procedure report does not answer your questions, please call your gastroenterologist to clarify.  If you requested that your care partner not be given the details of your procedure findings, then the procedure report has been included in a sealed envelope for you to review at your convenience later.  YOU SHOULD EXPECT: Some feelings of bloating in the abdomen. Passage of more gas than usual.  Walking can help get rid of the air that was put into your GI tract during the procedure and reduce the bloating. If you had a lower endoscopy (such as a colonoscopy or flexible sigmoidoscopy) you may notice spotting of blood in your stool or on the toilet paper. If you underwent a bowel prep for your procedure, then you may not have a normal bowel movement for a few days.  DIET: Your first meal following the procedure should be a light meal and then it is ok to progress to your normal diet.  A half-sandwich or bowl of soup is an example of a good first meal.  Heavy or fried foods are harder to digest and may make you feel nauseous or bloated.  Likewise meals heavy in dairy and vegetables can cause extra gas to form and this can also increase the bloating.  Drink plenty of fluids but you should avoid alcoholic beverages for 24 hours.  ACTIVITY: Your care partner should take you home directly after the procedure.  You should plan to take it easy, moving slowly for the rest of the day.  You can resume normal activity the day after the procedure however you should NOT DRIVE or use heavy machinery for 24 hours (because of the sedation medicines used during the test).    SYMPTOMS TO REPORT  IMMEDIATELY: A gastroenterologist can be reached at any hour.  During normal business hours, 8:30 AM to 5:00 PM Monday through Friday, call [phone].  After hours and on weekends, please call the GI answering service at [phone] who will take a message and have the physician on call contact you.   Following lower endoscopy (colonoscopy or flexible sigmoidoscopy):  Excessive amounts of blood in the stool  Significant tenderness or worsening of abdominal pains  Swelling of the abdomen that is new, acute  Fever of 100F or higher  FOLLOW UP: If any biopsies were taken you will be contacted by phone or by letter within the next 1-3 weeks.  Call your gastroenterologist if you have not heard about the biopsies in 3 weeks.  Our staff will call the home number listed on your records the next business day following your procedure to check on you and address any questions or concerns that you may have at that time regarding the information given to you following your procedure. This is a courtesy call and so if there is no answer at the home number and we have not heard from you through the emergency physician on call, we will assume that you have returned to your regular daily activities without incident.  SIGNATURES/CONFIDENTIALITY: You and/or your care partner have signed paperwork which will be entered into your electronic medical record.  These signatures attest to the fact that that the information above on your After Visit Summary   has been reviewed and is understood.  Full responsibility of the confidentiality of this discharge information lies with you and/or your care-partner.  Discharge instructions given with verbal understanding. Handouts on polyps,diverticulosis and hemorrhoids. Resume previous medications. YOU HAD AN ENDOSCOPIC PROCEDURE TODAY AT [facility]: Refer to the procedure report that was given to you for any specific questions about what was found during  the examination.  If the procedure report does not answer your questions, please call your gastroenterologist to clarify.  If you requested that your care partner not be given the details of your procedure findings, then the procedure report has been included in a sealed envelope for you to review at your convenience later.  YOU SHOULD EXPECT: Some feelings of bloating in the abdomen. Passage of more gas than usual.  Walking can help get rid of the air that was put into your GI tract during the procedure and reduce the bloating. If you had a lower endoscopy (such as a colonoscopy or flexible sigmoidoscopy) you may notice spotting of blood in your stool or on the toilet paper. If you underwent a bowel prep for your procedure, then you may not have a normal bowel movement for a few days.  DIET: Your first meal following the procedure should be a light meal and then it is ok to progress to your normal diet.  A half-sandwich or bowl of soup is an example of a good first meal.  Heavy or fried foods are harder to digest and may make you feel nauseous or bloated.  Likewise meals heavy in dairy and vegetables can cause extra gas to form and this can also increase the bloating.  Drink plenty of fluids but you should avoid alcoholic beverages for 24 hours.  ACTIVITY: Your care partner should take you home directly after the procedure.  You should plan to take it easy, moving slowly for the rest of the day.  You can resume normal activity the day after the procedure however you should NOT DRIVE or use heavy machinery for 24 hours (because of the sedation medicines used during the test).    SYMPTOMS TO REPORT IMMEDIATELY: A gastroenterologist can be reached at any hour.  During normal business hours, 8:30 AM to 5:00 PM Monday through Friday, call [phone].  After hours and on weekends, please call the GI answering service at [phone] who will take a message and have the physician on call contact  you.   Following lower endoscopy (colonoscopy or flexible sigmoidoscopy):  Excessive amounts of blood in the stool  Significant tenderness or worsening of abdominal pains  Swelling of the abdomen that is new, acute  Fever of 100F or higher  FOLLOW UP: If any biopsies were taken you will be contacted by phone or by letter within the next 1-3 weeks.  Call your gastroenterologist if you have not heard about the biopsies in 3 weeks.  Our staff will call the home number listed on your records the next business day following your procedure to check on you and address any questions or concerns that you may have at that time regarding the information given to you following your procedure. This is a courtesy call and so if there is no answer at the home number and we have not heard from you through the emergency physician on call, we will assume that you have returned to your regular daily activities without incident.  SIGNATURES/CONFIDENTIALITY: You and/or your care partner have signed paperwork which will  be entered into your electronic medical record.  These signatures attest to the fact that that the information above on your After Visit Summary has been reviewed and is understood.  Full responsibility of the confidentiality of this discharge information lies with you and/or your care-partner. 

## 2014-02-10 NOTE — Progress Notes (Signed)
Stable to RR 

## 2014-02-11 ENCOUNTER — Telehealth: Payer: Self-pay

## 2014-02-11 NOTE — Telephone Encounter (Signed)
  Follow up Call-  Call back number 02/10/2014  Post procedure Call Back phone  # [phone]  Permission to leave phone message Yes     Patient questions:  Do you have a fever, pain , or abdominal swelling? no Pain Score  0 *  Have you tolerated food without any problems? yes  Have you been able to return to your normal activities? yes  Do you have any questions about your discharge instructions: Diet   no Medications  no Follow up visit  no  Do you have questions or concerns about your Care? no  Actions: * If pain score is 4 or above: No action needed, pain <4.

## 2014-02-15 ENCOUNTER — Encounter: Payer: Self-pay | Admitting: Gastroenterology

## 2014-02-21 ENCOUNTER — Other Ambulatory Visit: Payer: Self-pay | Admitting: Internal Medicine

## 2014-02-28 ENCOUNTER — Telehealth: Payer: Self-pay | Admitting: Internal Medicine

## 2014-02-28 ENCOUNTER — Encounter: Payer: Self-pay | Admitting: Internal Medicine

## 2014-02-28 DIAGNOSIS — M25562 Pain in left knee: Secondary | ICD-10-CM

## 2014-02-28 NOTE — Telephone Encounter (Signed)
Pt is requesting to get a referral to  orthopedics.

## 2014-03-01 ENCOUNTER — Encounter: Payer: Self-pay | Admitting: Internal Medicine

## 2014-03-01 ENCOUNTER — Other Ambulatory Visit: Payer: Self-pay | Admitting: *Deleted

## 2014-03-01 NOTE — Telephone Encounter (Signed)
Pt is having left knee pain, ok per Dr Arnoldo Morale referral order placed

## 2014-03-02 ENCOUNTER — Ambulatory Visit (INDEPENDENT_AMBULATORY_CARE_PROVIDER_SITE_OTHER)
Admission: RE | Admit: 2014-03-02 | Discharge: 2014-03-02 | Disposition: A | Discharge status: Home / Self Care | Payer: Medicare HMO | Source: Ambulatory Visit | Attending: Internal Medicine | Admitting: Internal Medicine

## 2014-03-02 ENCOUNTER — Ambulatory Visit (INDEPENDENT_AMBULATORY_CARE_PROVIDER_SITE_OTHER): Payer: Medicare HMO | Admitting: Internal Medicine

## 2014-03-02 ENCOUNTER — Encounter: Payer: Self-pay | Admitting: Internal Medicine

## 2014-03-02 VITALS — BP 132/72 | Temp 98.0°F | Ht 72.0 in | Wt 206.0 lb

## 2014-03-02 DIAGNOSIS — M1710 Unilateral primary osteoarthritis, unspecified knee: Secondary | ICD-10-CM | POA: Insufficient documentation

## 2014-03-02 DIAGNOSIS — M25562 Pain in left knee: Secondary | ICD-10-CM

## 2014-03-02 DIAGNOSIS — M1712 Unilateral primary osteoarthritis, left knee: Secondary | ICD-10-CM

## 2014-03-02 DIAGNOSIS — M25569 Pain in unspecified knee: Secondary | ICD-10-CM

## 2014-03-02 DIAGNOSIS — M179 Osteoarthritis of knee, unspecified: Secondary | ICD-10-CM | POA: Insufficient documentation

## 2014-03-02 HISTORY — DX: Unilateral primary osteoarthritis, left knee: M17.12

## 2014-03-02 MED ORDER — TRAMADOL HCL 50 MG PO TABS: 50.0000 mg | ORAL_TABLET | Freq: Two times a day (BID) | ORAL | Status: DC | PRN

## 2014-03-02 NOTE — Patient Instructions (Addendum)
Apply cool compress twice daily as directed Use Aleve once daily as needed for 1 week only Please contact our office if your symptoms do not improve or gets worse.

## 2014-03-02 NOTE — Assessment & Plan Note (Addendum)
73 year old white male presents with 3 weeks of mild to moderate left knee pain. On exam he has joint swelling but no redness. Knee joint is stable. I suspect patient's symptoms would be more severe if his symptoms were secondary to crystal arthropathy. His symptoms likely secondary to exacerbation of osteoarthritis.  Question meniscal injury.  We discussed potential treatment options.  Risks and benefits of joint injection reviewed.    Injected left knee (anterior medial approach) with 3 ml consisting of Depomedrol 80 mg and 1% lidocaine.  Aseptic technique utilized.  Patient tolerated well without complications.  Obtain left knee x-ray.  Also CBCD, BMET and Uric acid level.  Continue cool compress.  Use Aleve once daily for 7 days.    Patient advised to call office if symptoms persist or worsen.  Reassess in 2 weeks.  If no significant improvement, we discussed referral to orthopedic specialist for further evaluation and treatment.  Addendum:  Left knee x ray showed mild to moderate tricompartmental osteoarthritis and moderate joint effusion.  Patient notified of x ray results.  Patient understands to follow up if persistent or worsening symptoms.

## 2014-03-02 NOTE — Progress Notes (Signed)
Subjective:    Patient ID: Wesley Fuller, male    DOB: [birth year], 73 y.o.   MRN: [id number]  HPI  73 year old white male with history of hypertension and nephrolithiasis complains of 3 weeks of left knee pain. He denies any specific trauma or injury. His symptoms worse after playing with his grandchildren. Patient rates his discomfort as 6/10. He occasionally has sharp pains near medial joint line and below his kneecap. He denies any discomfort with going up stairs but some discomfort with crossing his legs.  Mild improvement after taking OTC Aleve.  Patient denies any fever or chills. He denies any recent procedures or dental work.  Review of Systems Negative for fever or chills, negative for joint redness. Patient able to ambulate without excruciating pain    Past Medical History  Diagnosis Date  . Hypertension   . ED (erectile dysfunction)   . Calcium oxalate renal stones   . Diverticulosis   . Nail fungal infection   . Cataract 2013  . Allergy     seasonal  . Melanoma of back 1974  . GERD (gastroesophageal reflux disease)     History   Social History  . Marital Status: Married    Spouse Name: N/A    Number of Children: N/A  . Years of Education: N/A   Occupational History  . Not on file.   Social History Main Topics  . Smoking status: Never Smoker   . Smokeless tobacco: Never Used  . Alcohol Use: 3.0 oz/week    5 Glasses of wine per week     Comment: moderate  . Drug Use: No  . Sexual Activity: Yes   Other Topics Concern  . Not on file   Social History Narrative  . No narrative on file    Past Surgical History  Procedure Laterality Date  . Skin cancer    . Tonsillectomy  1974  . Lymph node disection  1981  . Melanoma removal of back   1974  . Left leg stripping    . Hernia repair    . Tonsillectomy    . Nasal polyp surgery      Family History  Problem Relation Age of Onset  . Colon cancer Father   . Colon cancer Paternal Uncle   .  Hypertension    . Colon cancer Paternal Uncle     No Known Allergies  Current Outpatient Prescriptions on File Prior to Visit  Medication Sig Dispense Refill  . ascorbic Acid (VITAMIN C) 500 MG CPCR Take 500 mg by mouth daily.        Marland Kitchen aspirin 81 MG tablet Take 81 mg by mouth daily.        . calcium-vitamin D (OSCAL WITH D) 500-200 MG-UNIT per tablet Take 2 tablets by mouth daily.        Marland Kitchen EXFORGE HCT 10-320-25 MG TABS TAKE 1 TABLET BY MOUTH ONCE A DAY  90 each  0  . hydrochlorothiazide (HYDRODIURIL) 25 MG tablet Take 25 mg by mouth daily.       . Multiple Vitamins-Minerals (ICAPS MV PO) Take by mouth daily.        Marland Kitchen omeprazole (PRILOSEC) 40 MG capsule TAKE 1 CAPSULE BY MOUTH 30 MINUTES BEFORE A MEAL ONCE A DAY  90 capsule  2  . sildenafil (VIAGRA) 100 MG tablet Take 1 tablet (100 mg total) by mouth daily as needed.  10 tablet  11   No current facility-administered medications on file prior to  visit.    BP 132/72  Temp(Src) 98 F (36.7 C) (Oral)  Ht 6' (1.829 m)  Wt 206 lb (93.441 kg)  BMI 27.93 kg/m2    Objective:   Physical Exam  Constitutional: He is oriented to person, place, and time. He appears well-developed and well-nourished. No distress.  HENT:  Head: Normocephalic and atraumatic.  Mouth/Throat: Oropharynx is clear and moist.  Eyes: Conjunctivae and EOM are normal. Pupils are equal, round, and reactive to light.  Neck: Neck supple.  Cardiovascular: Normal rate, regular rhythm and normal heart sounds.   Pulmonary/Chest: Effort normal and breath sounds normal. He has no wheezes.  Musculoskeletal:  Left knee swelling.  No redness.  Mild tenderness of medial joint line.  Negative apley's compression test  Lymphadenopathy:    He has no cervical adenopathy.  Neurological: He is alert and oriented to person, place, and time. No cranial nerve deficit.  Psychiatric: He has a normal mood and affect. His behavior is normal.          Assessment & Plan:

## 2014-03-02 NOTE — Progress Notes (Signed)
Pre visit review using our clinic review tool, if applicable. No additional management support is needed unless otherwise documented below in the visit note. 

## 2014-03-03 LAB — CBC WITH DIFFERENTIAL/PLATELET
BASOS PCT: 0.2 % (ref 0.0–3.0)
Basophils Absolute: 0 10*3/uL (ref 0.0–0.1)
Eosinophils Absolute: 0.1 10*3/uL (ref 0.0–0.7)
Eosinophils Relative: 1.6 % (ref 0.0–5.0)
HEMATOCRIT: 44.2 % (ref 39.0–52.0)
HEMOGLOBIN: 15.1 g/dL (ref 13.0–17.0)
LYMPHS PCT: 15.8 % (ref 12.0–46.0)
Lymphs Abs: 1.3 10*3/uL (ref 0.7–4.0)
MCHC: 34.2 g/dL (ref 30.0–36.0)
MCV: 94.8 fl (ref 78.0–100.0)
Monocytes Absolute: 0.6 10*3/uL (ref 0.1–1.0)
Monocytes Relative: 6.7 % (ref 3.0–12.0)
Neutro Abs: 6.2 10*3/uL (ref 1.4–7.7)
Neutrophils Relative %: 75.7 % (ref 43.0–77.0)
Platelets: 198 10*3/uL (ref 150.0–400.0)
RBC: 4.66 Mil/uL (ref 4.22–5.81)
RDW: 12.8 % (ref 11.5–14.6)
WBC: 8.2 10*3/uL (ref 4.5–10.5)

## 2014-03-03 LAB — BASIC METABOLIC PANEL
BUN: 28 mg/dL — AB (ref 6–23)
CHLORIDE: 105 meq/L (ref 96–112)
CO2: 30 meq/L (ref 19–32)
Calcium: 9.5 mg/dL (ref 8.4–10.5)
Creatinine, Ser: 1 mg/dL (ref 0.4–1.5)
GFR: 76.06 mL/min (ref >60.00)
Glucose, Bld: 100 mg/dL — ABNORMAL HIGH (ref 70–99)
POTASSIUM: 4.1 meq/L (ref 3.5–5.1)
Sodium: 142 mEq/L (ref 135–145)

## 2014-03-03 LAB — URIC ACID: Uric Acid, Serum: 6.6 mg/dL (ref 4.0–7.8)

## 2014-03-07 ENCOUNTER — Encounter: Payer: Self-pay | Admitting: Internal Medicine

## 2014-05-30 ENCOUNTER — Other Ambulatory Visit: Payer: Self-pay | Admitting: Internal Medicine

## 2014-05-31 ENCOUNTER — Encounter: Payer: Self-pay | Admitting: *Deleted

## 2014-06-02 ENCOUNTER — Encounter: Payer: Self-pay | Admitting: *Deleted

## 2014-06-02 ENCOUNTER — Other Ambulatory Visit (INDEPENDENT_AMBULATORY_CARE_PROVIDER_SITE_OTHER): Payer: Medicare HMO

## 2014-06-02 DIAGNOSIS — Z0000 Encounter for general adult medical examination without abnormal findings: Secondary | ICD-10-CM

## 2014-06-02 DIAGNOSIS — I10 Essential (primary) hypertension: Secondary | ICD-10-CM

## 2014-06-02 LAB — BASIC METABOLIC PANEL
BUN: 24 mg/dL — AB (ref 6–23)
CHLORIDE: 106 meq/L (ref 96–112)
CO2: 30 mEq/L (ref 19–32)
Calcium: 9.3 mg/dL (ref 8.4–10.5)
Creatinine, Ser: 0.9 mg/dL (ref 0.4–1.5)
GFR: 85.62 mL/min (ref >60.00)
Glucose, Bld: 87 mg/dL (ref 70–99)
POTASSIUM: 4.2 meq/L (ref 3.5–5.1)
Sodium: 143 mEq/L (ref 135–145)

## 2014-06-02 LAB — POCT URINALYSIS DIPSTICK
BILIRUBIN UA: NEGATIVE
Blood, UA: NEGATIVE
GLUCOSE UA: NEGATIVE
Ketones, UA: NEGATIVE
LEUKOCYTES UA: NEGATIVE
Nitrite, UA: NEGATIVE
SPEC GRAV UA: 1.025
Urobilinogen, UA: 0.2
pH, UA: 6.5

## 2014-06-02 LAB — CBC WITH DIFFERENTIAL/PLATELET
BASOS ABS: 0.1 10*3/uL (ref 0.0–0.1)
Basophils Relative: 1.4 % (ref 0.0–3.0)
Eosinophils Absolute: 0.3 10*3/uL (ref 0.0–0.7)
Eosinophils Relative: 4.8 % (ref 0.0–5.0)
HEMATOCRIT: 46.1 % (ref 39.0–52.0)
Hemoglobin: 15.6 g/dL (ref 13.0–17.0)
LYMPHS ABS: 1.7 10*3/uL (ref 0.7–4.0)
Lymphocytes Relative: 25.3 % (ref 12.0–46.0)
MCHC: 33.8 g/dL (ref 30.0–36.0)
MCV: 96 fl (ref 78.0–100.0)
MONO ABS: 0.7 10*3/uL (ref 0.1–1.0)
Monocytes Relative: 10.1 % (ref 3.0–12.0)
Neutro Abs: 3.8 10*3/uL (ref 1.4–7.7)
Neutrophils Relative %: 58.4 % (ref 43.0–77.0)
Platelets: 217 10*3/uL (ref 150.0–400.0)
RBC: 4.8 Mil/uL (ref 4.22–5.81)
RDW: 12.9 % (ref 11.5–15.5)
WBC: 6.6 10*3/uL (ref 4.0–10.5)

## 2014-06-02 LAB — HEPATIC FUNCTION PANEL
ALT: 18 U/L (ref 0–53)
AST: 23 U/L (ref 0–37)
Albumin: 4 g/dL (ref 3.5–5.2)
Alkaline Phosphatase: 54 U/L (ref 39–117)
BILIRUBIN TOTAL: 1.4 mg/dL — AB (ref 0.2–1.2)
Bilirubin, Direct: 0.2 mg/dL (ref 0.0–0.3)
Total Protein: 6.3 g/dL (ref 6.0–8.3)

## 2014-06-02 LAB — PSA: PSA: 1.65 ng/mL (ref 0.10–4.00)

## 2014-06-02 LAB — LIPID PANEL
CHOLESTEROL: 166 mg/dL (ref 0–200)
HDL: 50.1 mg/dL (ref >39.00)
LDL Cholesterol: 101 mg/dL — ABNORMAL HIGH (ref 0–99)
NonHDL: 115.9
TRIGLYCERIDES: 74 mg/dL (ref 0.0–149.0)
Total CHOL/HDL Ratio: 3
VLDL: 14.8 mg/dL (ref 0.0–40.0)

## 2014-06-02 LAB — TSH: TSH: 1.91 u[IU]/mL (ref 0.35–4.50)

## 2014-06-09 ENCOUNTER — Other Ambulatory Visit: Payer: Medicare Other

## 2014-06-16 ENCOUNTER — Encounter: Payer: Self-pay | Admitting: Internal Medicine

## 2014-06-17 ENCOUNTER — Encounter: Payer: Medicare Other | Admitting: Internal Medicine

## 2014-06-17 ENCOUNTER — Encounter: Payer: Self-pay | Admitting: Internal Medicine

## 2014-06-17 ENCOUNTER — Ambulatory Visit (INDEPENDENT_AMBULATORY_CARE_PROVIDER_SITE_OTHER): Payer: Medicare HMO | Admitting: Internal Medicine

## 2014-06-17 VITALS — BP 130/84 | Temp 98.2°F | Ht 71.0 in | Wt 201.0 lb

## 2014-06-17 DIAGNOSIS — Z0000 Encounter for general adult medical examination without abnormal findings: Secondary | ICD-10-CM

## 2014-06-17 NOTE — Progress Notes (Signed)
Subjective:    Patient ID: Wesley Fuller, male    DOB: 1941/09/18, 73 y.o.   MRN: [id number]  HPI MA CPX HTN, lipids and GERD Knee meniscal tear   Review of Systems  Constitutional: Negative for fever and fatigue.  HENT: Negative for congestion, hearing loss and postnasal drip.   Eyes: Negative for discharge, redness and visual disturbance.  Respiratory: Negative for cough, shortness of breath and wheezing.   Cardiovascular: Negative for leg swelling.  Gastrointestinal: Negative for abdominal pain, constipation and abdominal distention.  Genitourinary: Negative for urgency and frequency.  Musculoskeletal: Negative for arthralgias, joint swelling and neck pain.  Skin: Negative for color change and rash.  Neurological: Negative for weakness and light-headedness.  Hematological: Negative for adenopathy.  Psychiatric/Behavioral: Negative for behavioral problems.       Past Medical History  Diagnosis Date  . Hypertension   . ED (erectile dysfunction)   . Calcium oxalate renal stones   . Diverticulosis   . Nail fungal infection   . Cataract 2013  . Allergy     seasonal  . Melanoma of back 1974  . GERD (gastroesophageal reflux disease)     History   Social History  . Marital Status: Married    Spouse Name: N/A    Number of Children: N/A  . Years of Education: N/A   Occupational History  . Not on file.   Social History Main Topics  . Smoking status: Never Smoker   . Smokeless tobacco: Never Used  . Alcohol Use: 3.0 oz/week    5 Glasses of wine per week     Comment: moderate  . Drug Use: No  . Sexual Activity: Yes   Other Topics Concern  . Not on file   Social History Narrative  . No narrative on file    Past Surgical History  Procedure Laterality Date  . Skin cancer    . Tonsillectomy  1974  . Lymph node disection  1981  . Melanoma removal of back   1974  . Left leg stripping    . Hernia repair    . Tonsillectomy    . Nasal polyp surgery       Family History  Problem Relation Age of Onset  . Colon cancer Father   . Colon cancer Paternal Uncle   . Hypertension    . Colon cancer Paternal Uncle     No Known Allergies  Current Outpatient Prescriptions on File Prior to Visit  Medication Sig Dispense Refill  . ascorbic Acid (VITAMIN C) 500 MG CPCR Take 500 mg by mouth daily.        Marland Kitchen aspirin 81 MG tablet Take 81 mg by mouth daily.        . calcium-vitamin D (OSCAL WITH D) 500-200 MG-UNIT per tablet Take 2 tablets by mouth daily.        Marland Kitchen EXFORGE HCT 10-320-25 MG TABS TAKE 1 TABLET BY MOUTH ONCE A DAY  90 each  0  . hydrochlorothiazide (HYDRODIURIL) 25 MG tablet Take 25 mg by mouth daily.       . Multiple Vitamins-Minerals (ICAPS MV PO) Take by mouth daily.        Marland Kitchen omeprazole (PRILOSEC) 40 MG capsule TAKE 1 CAPSULE BY MOUTH 30 MINUTES BEFORE A MEAL ONCE A DAY  90 capsule  0  . sildenafil (VIAGRA) 100 MG tablet Take 1 tablet (100 mg total) by mouth daily as needed.  10 tablet  11   No current facility-administered  medications on file prior to visit.    BP 130/84  Temp(Src) 98.2 F (36.8 C) (Oral)  Ht 5\' 11"  (1.803 m)  Wt 201 lb (91.173 kg)  BMI 28.05 kg/m2    Objective:   Physical Exam  Constitutional: He is oriented to person, place, and time. He appears well-developed and well-nourished.  HENT:  Head: Normocephalic and atraumatic.  Eyes: Conjunctivae are normal. Pupils are equal, round, and reactive to light.  Neck: Normal range of motion. Neck supple.  Cardiovascular: Normal rate and regular rhythm.   Pulmonary/Chest: Effort normal and breath sounds normal.  Abdominal: Soft. Bowel sounds are normal.  Genitourinary: Prostate normal.  Musculoskeletal: Normal range of motion.  Neurological: He is alert and oriented to person, place, and time.  Skin: Skin is warm and dry.          Assessment & Plan:  Patient presents for yearly preventative medicine examination. Medicare questionnaire was  completed  All immunizations and health maintenance protocols were reviewed with the patient and needed orders were placed.  Appropriate screening laboratory values were ordered for the patient including screening of hyperlipidemia, renal function and hepatic function. If indicated by BPH, a PSA was ordered.  Medication reconciliation,  past medical history, social history, problem list and allergies were reviewed in detail with the patient  Goals were established with regard to weight loss, exercise, and  diet in compliance with medications  End of life planning was discussed.  EGD and knee pain with referral to Dr Theda Sers did cortisone followed by [facility] with trial of cialis

## 2014-06-17 NOTE — Progress Notes (Signed)
Pre visit review using our clinic review tool, if applicable. No additional management support is needed unless otherwise documented below in the visit note. 

## 2014-06-17 NOTE — Patient Instructions (Addendum)
The patient is instructed to continue all medications as prescribed. Schedule followup with check out clerk upon leaving the clinic   Puzzles and word games  Vitamin  E 400 mg ginsing

## 2014-07-19 ENCOUNTER — Other Ambulatory Visit: Payer: Self-pay | Admitting: *Deleted

## 2014-07-19 MED ORDER — EXFORGE HCT 10-320-25 MG PO TABS
ORAL_TABLET | ORAL | Status: DC
Start: 2014-07-19 — End: 2014-08-18

## 2014-08-17 ENCOUNTER — Telehealth: Payer: Self-pay

## 2014-08-17 MED ORDER — OMEPRAZOLE 40 MG PO CPDR: 40.0000 mg | DELAYED_RELEASE_CAPSULE | Freq: Every day | ORAL | Status: DC

## 2014-08-17 MED ORDER — AMLODIPINE-VALSARTAN-HCTZ 10-320-25 MG PO TABS: 1.0000 | ORAL_TABLET | Freq: Every day | ORAL | Status: DC

## 2014-08-17 NOTE — Telephone Encounter (Signed)
Error

## 2014-08-18 ENCOUNTER — Other Ambulatory Visit: Payer: Self-pay | Admitting: Internal Medicine

## 2014-09-30 ENCOUNTER — Ambulatory Visit (INDEPENDENT_AMBULATORY_CARE_PROVIDER_SITE_OTHER): Payer: Medicare HMO | Admitting: Internal Medicine

## 2014-09-30 ENCOUNTER — Encounter: Payer: Self-pay | Admitting: Internal Medicine

## 2014-09-30 VITALS — BP 130/70

## 2014-09-30 DIAGNOSIS — N522 Drug-induced erectile dysfunction: Secondary | ICD-10-CM

## 2014-09-30 DIAGNOSIS — I10 Essential (primary) hypertension: Secondary | ICD-10-CM

## 2014-09-30 MED ORDER — SILDENAFIL CITRATE 100 MG PO TABS: 100.0000 mg | ORAL_TABLET | Freq: Every day | ORAL | Status: DC | PRN

## 2014-09-30 MED ORDER — OMEPRAZOLE 40 MG PO CPDR: DELAYED_RELEASE_CAPSULE | ORAL | Status: DC

## 2014-09-30 MED ORDER — AMLODIPINE-VALSARTAN-HCTZ 10-320-25 MG PO TABS: ORAL_TABLET | ORAL | Status: DC

## 2014-09-30 MED ORDER — HYDROCHLOROTHIAZIDE 25 MG PO TABS: 25.0000 mg | ORAL_TABLET | Freq: Every day | ORAL | Status: DC

## 2014-09-30 NOTE — Patient Instructions (Signed)
Acute sinusitis symptoms are generally not helped by antibiotic therapy.  Use saline irrigation, warm  moist compresses and over-the-counter decongestants only as directed.  Call if there is no improvement in 5 to 7 days, or sooner if you develop increasing pain, fever, or any new symptoms. 

## 2014-09-30 NOTE — Progress Notes (Signed)
Pre visit review using our clinic review tool, if applicable. No additional management support is needed unless otherwise documented below in the visit note. 

## 2014-09-30 NOTE — Progress Notes (Signed)
Subjective:    Patient ID: Wesley Fuller, male    DOB: 1941/06/24, 73 y.o.   MRN: [id number]  HPI  73 year old patient who has treated hypertension.  He presents with a 3-4 week history of sinus congestion, drainage, and cough.  No fever.  Symptoms are steadily improving.  He played golf earlier today.  He has returned from a recent trip from [place].  Past Medical History  Diagnosis Date  . Hypertension   . ED (erectile dysfunction)   . Calcium oxalate renal stones   . Diverticulosis   . Nail fungal infection   . Cataract 2013  . Allergy     seasonal  . Melanoma of back 1974  . GERD (gastroesophageal reflux disease)     History   Social History  . Marital Status: Married    Spouse Name: N/A    Number of Children: N/A  . Years of Education: N/A   Occupational History  . Not on file.   Social History Main Topics  . Smoking status: Never Smoker   . Smokeless tobacco: Never Used  . Alcohol Use: 3.0 oz/week    5 Glasses of wine per week     Comment: moderate  . Drug Use: No  . Sexual Activity: Yes   Other Topics Concern  . Not on file   Social History Narrative  . No narrative on file    Past Surgical History  Procedure Laterality Date  . Skin cancer    . Tonsillectomy  1974  . Lymph node disection  1981  . Melanoma removal of back   1974  . Left leg stripping    . Hernia repair    . Tonsillectomy    . Nasal polyp surgery      Family History  Problem Relation Age of Onset  . Colon cancer Father   . Colon cancer Paternal Uncle   . Hypertension    . Colon cancer Paternal Uncle     No Known Allergies  Current Outpatient Prescriptions on File Prior to Visit  Medication Sig Dispense Refill  . ascorbic Acid (VITAMIN C) 500 MG CPCR Take 500 mg by mouth daily.        Marland Kitchen aspirin 81 MG tablet Take 81 mg by mouth daily.        . calcium-vitamin D (OSCAL WITH D) 500-200 MG-UNIT per tablet Take 2 tablets by mouth daily.        Marland Kitchen EXFORGE HCT 10-320-25 MG  TABS TAKE 1 TABLET BY MOUTH ONCE A DAY  90 each  0  . hydrochlorothiazide (HYDRODIURIL) 25 MG tablet Take 25 mg by mouth daily.       . Multiple Vitamins-Minerals (ICAPS MV PO) Take by mouth daily.        Marland Kitchen omeprazole (PRILOSEC) 40 MG capsule TAKE 1 CAPSULE BY MOUTH DAILY 30 MINTUES BEFORE AMEAL  90 capsule  0  . sildenafil (VIAGRA) 100 MG tablet Take 1 tablet (100 mg total) by mouth daily as needed.  10 tablet  11   No current facility-administered medications on file prior to visit.    BP 130/70     Review of Systems  Constitutional: Negative for fever, chills, appetite change and fatigue.  HENT: Positive for congestion, postnasal drip, rhinorrhea and sinus pressure. Negative for dental problem, ear pain, hearing loss, sore throat, tinnitus, trouble swallowing and voice change.   Eyes: Negative for pain, discharge and visual disturbance.  Respiratory: Positive for cough. Negative for chest tightness,  wheezing and stridor.   Cardiovascular: Negative for chest pain, palpitations and leg swelling.  Gastrointestinal: Negative for nausea, vomiting, abdominal pain, diarrhea, constipation, blood in stool and abdominal distention.  Genitourinary: Negative for urgency, hematuria, flank pain, discharge, difficulty urinating and genital sores.  Musculoskeletal: Negative for arthralgias, back pain, gait problem, joint swelling, myalgias and neck stiffness.  Skin: Negative for rash.  Neurological: Negative for dizziness, syncope, speech difficulty, weakness, numbness and headaches.  Hematological: Negative for adenopathy. Does not bruise/bleed easily.  Psychiatric/Behavioral: Negative for behavioral problems and dysphoric mood. The patient is not nervous/anxious.        Objective:   Physical Exam  Constitutional: He is oriented to person, place, and time. He appears well-developed.  HENT:  Head: Normocephalic.  Right Ear: External ear normal.  Left Ear: External ear normal.  Eyes:  Conjunctivae and EOM are normal.  Neck: Normal range of motion.  Cardiovascular: Normal rate and normal heart sounds.   Pulmonary/Chest: Breath sounds normal.  Abdominal: Bowel sounds are normal.  Musculoskeletal: Normal range of motion. He exhibits no edema and no tenderness.  Neurological: He is alert and oriented to person, place, and time.  Psychiatric: He has a normal mood and affect. His behavior is normal.          Assessment & Plan:   Viral URI.  Largerly resolved Hypertension stable  We'll continue symptomatic treatment CPX as scheduled Medications updated

## 2014-10-03 ENCOUNTER — Telehealth: Payer: Self-pay | Admitting: Internal Medicine

## 2014-10-03 NOTE — Telephone Encounter (Signed)
emmi emailed °

## 2014-11-08 ENCOUNTER — Ambulatory Visit: Payer: Medicare HMO

## 2014-11-08 ENCOUNTER — Ambulatory Visit (INDEPENDENT_AMBULATORY_CARE_PROVIDER_SITE_OTHER): Payer: Medicare HMO

## 2014-11-08 DIAGNOSIS — Z23 Encounter for immunization: Secondary | ICD-10-CM

## 2014-12-08 ENCOUNTER — Encounter: Payer: Self-pay | Admitting: Family Medicine

## 2014-12-08 ENCOUNTER — Ambulatory Visit (INDEPENDENT_AMBULATORY_CARE_PROVIDER_SITE_OTHER): Payer: Medicare HMO | Admitting: Family Medicine

## 2014-12-08 ENCOUNTER — Ambulatory Visit: Payer: Medicare HMO | Admitting: Family Medicine

## 2014-12-08 VITALS — BP 158/62 | HR 76 | Temp 97.9°F | Wt 200.0 lb

## 2014-12-08 DIAGNOSIS — F528 Other sexual dysfunction not due to a substance or known physiological condition: Secondary | ICD-10-CM

## 2014-12-08 DIAGNOSIS — Z23 Encounter for immunization: Secondary | ICD-10-CM

## 2014-12-08 DIAGNOSIS — I10 Essential (primary) hypertension: Secondary | ICD-10-CM

## 2014-12-08 MED ORDER — TADALAFIL 20 MG PO TABS: 10.0000 mg | ORAL_TABLET | Freq: Every day | ORAL | Status: DC | PRN

## 2014-12-08 MED ORDER — VALSARTAN 320 MG PO TABS: 320.0000 mg | ORAL_TABLET | Freq: Every day | ORAL | Status: DC

## 2014-12-08 MED ORDER — ZOSTER VACCINE LIVE 19400 UNT/0.65ML ~~LOC~~ SOLR: 0.6500 mL | Freq: Once | SUBCUTANEOUS | Status: DC

## 2014-12-08 MED ORDER — AMLODIPINE BESYLATE 10 MG PO TABS: 10.0000 mg | ORAL_TABLET | Freq: Every day | ORAL | Status: DC

## 2014-12-08 MED ORDER — HYDROCHLOROTHIAZIDE 25 MG PO TABS: 25.0000 mg | ORAL_TABLET | Freq: Every day | ORAL | Status: DC

## 2014-12-08 NOTE — Assessment & Plan Note (Signed)
Trial samples of Cialis 20 mg take half tab as needed before sexual intercourse. If this does well with patient and we will write an ongoing Rx

## 2014-12-08 NOTE — Assessment & Plan Note (Signed)
Mild poor control but elevated in last 2 visits. Plan follow up 2-4 weeks. Changed combo pill to amlodipine 10mg , valsartan 320mg , hctz 25mg . Consider BMET and adding beta blocker at next visit

## 2014-12-08 NOTE — Patient Instructions (Addendum)
Print shingles vaccine off for you- call your insurance to see if you need it at pharmacy or your doctor's office.   Prevnar today (final pneumonia)  Take 1/2 tab of cialis 20mg . Call us when you run out and we can send in an rx for you.   Stop exforge. You need to take the 3 circled medications. Blood pressure up today-check in with me 2-4 weeks and we will see if we need an additional medicine (this is the first visit it has been high)  May check kidney function at next visit

## 2014-12-08 NOTE — Progress Notes (Signed)
Wesley Reddish, MD Phone: [phone]  Subjective:  Patient presents today to establish care with me as their new primary care provider. Patient was formerly a patient of Dr. Arnoldo Morale. Chief complaint-noted.   Hypertension-mild poor control and plan will not cover current meds in new year  BP Readings from Last 3 Encounters:  12/08/14 158/62  09/30/14 130/70  06/17/14 130/84  Home BP monitoring-[employer] in October 153/73 He is not exercising but has a [employer] membership. Compliant with medications-yes without side effects ROS-Denies any CP, HA, SOB, blurry vision, worsening LE edema.   Erectile dysfunction mild poor control -He is tried samples of Cialis and Viagra. Cialis seems to be more effective for him. ROS-no previous  Patient also updates me on the following [place] [facility] for Left OA- synvisc seemed to work.  6 month watch for glaucoma Interested in shingles vaccine  The following were reviewed and entered/updated in epic: Past Medical History  Diagnosis Date  . Hypertension   . ED (erectile dysfunction)   . Calcium oxalate renal stones   . Diverticulosis   . Nail fungal infection   . Cataract 2013  . Allergy     seasonal  . Melanoma of back 1974  . GERD (gastroesophageal reflux disease)   . Osteoarthritis of left knee 03/02/2014   Patient Active Problem List   Diagnosis Date Noted  . Essential hypertension 02/09/2008    Priority: Medium  . History of melanoma 02/09/2008    Priority: Medium  . Osteoarthritis of left knee 03/02/2014    Priority: Low  . VARICOSE VEINS LOWER EXTREMITIES W/INFLAMMATION 02/20/2010    Priority: Low  . GERD 11/21/2008    Priority: Low  . Actinic keratosis 10/12/2008    Priority: Low  . ERECTILE DYSFUNCTION 02/09/2008    Priority: Low  . RENAL CALCULUS, HX OF 02/09/2008    Priority: Low   Past Surgical History  Procedure Laterality Date  . Skin cancer    . Tonsillectomy  1974  . Lymph node disection  1981  . Melanoma  removal of back   1974  . Left leg stripping    . Hernia repair    . Nasal polyp surgery      Family History  Problem Relation Age of Onset  . Colon cancer Father   . Colon cancer Paternal Uncle   . Hypertension    . Colon cancer Paternal Uncle     Medications- reviewed and updated Current Outpatient Prescriptions  Medication Sig Dispense Refill  . ascorbic Acid (VITAMIN C) 500 MG CPCR Take 500 mg by mouth daily.      Marland Kitchen aspirin 81 MG tablet Take 81 mg by mouth daily.      . calcium-vitamin D (OSCAL WITH D) 500-200 MG-UNIT per tablet Take 2 tablets by mouth daily.      . Multiple Vitamins-Minerals (ICAPS MV PO) Take by mouth daily.      Marland Kitchen omeprazole (PRILOSEC) 40 MG capsule TAKE 1 CAPSULE BY MOUTH DAILY 30 MINTUES BEFORE AMEAL 90 capsule 5  . amLODipine (NORVASC) 10 MG tablet Take 1 tablet (10 mg total) by mouth daily. 30 tablet 11  . hydrochlorothiazide (HYDRODIURIL) 25 MG tablet Take 1 tablet (25 mg total) by mouth daily. 30 tablet 11  . tadalafil (CIALIS) 20 MG tablet Take 0.5 tablets (10 mg total) by mouth daily as needed for erectile dysfunction. 10 tablet 0  . valsartan (DIOVAN) 320 MG tablet Take 1 tablet (320 mg total) by mouth daily. 30 tablet 11  .  zoster vaccine live, PF, (ZOSTAVAX) 76734 UNT/0.65ML injection Inject 19,400 Units into the skin once. 1 each 0   No current facility-administered medications for this visit.    Allergies-reviewed and updated No Known Allergies  History   Social History  . Marital Status: Married    Spouse Name: N/A    Number of Children: N/A  . Years of Education: N/A   Social History Main Topics  . Smoking status: Never Smoker   . Smokeless tobacco: Never Used  . Alcohol Use: 3.0 oz/week    5 Glasses of wine per week     Comment: moderate  . Drug Use: No  . Sexual Activity: Yes   Other Topics Concern  . None   Social History Narrative   Married ([name]) 1965. 2 sons- 1 in [place]. 3 grandsons.       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 Oropharynx normal Neck: no thyromegaly CV: RRR no murmurs rubs or gallops Lungs: CTAB no crackles, wheeze, rhonchi Abdomen: soft/nontender/nondistended/normal bowel sounds. No rebound or guarding.  Ext: 1+ edema L>R (leg of varicose vein operation) Skin: warm, dry, varicose veins noted L leg Neuro: grossly normal, moves all extremities, PERRLA   Assessment/Plan:  Essential hypertension Mild poor control but elevated in last 2 visits. Plan follow up 2-4 weeks. Changed combo pill to amlodipine 10mg , valsartan 320mg , hctz 25mg . Consider BMET and adding beta blocker at next visit  ERECTILE DYSFUNCTION Trial samples of Cialis 20 mg take half tab as needed before sexual intercourse. If this does well with patient and we will write an ongoing Rx   Wants to monitor memory. INterested in A4 study at [facility] am not sure of who are candidates-encouraged patient to call. Mini cog normal with normal clock draw and 2 word recall.   Return precautions advised with planned 2-4 week follow up then 6 month physical.   Orders Placed This Encounter  Procedures  . Pneumococcal conjugate vaccine 13-valent    Meds ordered this encounter  Medications  . valsartan (DIOVAN) 320 MG tablet    Sig: Take 1 tablet (320 mg total) by mouth daily.    Dispense:  30 tablet    Refill:  11  . hydrochlorothiazide (HYDRODIURIL) 25 MG tablet    Sig: Take 1 tablet (25 mg total) by mouth daily.    Dispense:  30 tablet    Refill:  11  . amLODipine (NORVASC) 10 MG tablet    Sig: Take 1 tablet (10 mg total) by mouth daily.    Dispense:  30 tablet    Refill:  11  . tadalafil (CIALIS) 20 MG tablet    Sig: Take 0.5 tablets (10 mg total)  by mouth daily as needed for erectile dysfunction.    Dispense:  10 tablet    Refill:  0  . zoster vaccine live, PF, (ZOSTAVAX) 19379 UNT/0.65ML injection    Sig: Inject 19,400 Units into the skin once.    Dispense:  1 each    Refill:  0

## 2014-12-11 ENCOUNTER — Encounter: Payer: Self-pay | Admitting: Family Medicine

## 2014-12-26 ENCOUNTER — Encounter: Payer: Self-pay | Admitting: Family Medicine

## 2014-12-29 ENCOUNTER — Encounter: Payer: Self-pay | Admitting: Family Medicine

## 2014-12-29 ENCOUNTER — Ambulatory Visit (INDEPENDENT_AMBULATORY_CARE_PROVIDER_SITE_OTHER): Payer: Medicare HMO | Admitting: Family Medicine

## 2014-12-29 VITALS — BP 140/60 | Temp 98.1°F | Wt 202.0 lb

## 2014-12-29 DIAGNOSIS — I10 Essential (primary) hypertension: Secondary | ICD-10-CM

## 2014-12-29 MED ORDER — VALSARTAN 320 MG PO TABS: 320.0000 mg | ORAL_TABLET | Freq: Every day | ORAL | Status: DC

## 2014-12-29 MED ORDER — HYDROCHLOROTHIAZIDE 25 MG PO TABS: 25.0000 mg | ORAL_TABLET | Freq: Every day | ORAL | Status: DC

## 2014-12-29 MED ORDER — AMLODIPINE BESYLATE 10 MG PO TABS: 10.0000 mg | ORAL_TABLET | Freq: Every day | ORAL | Status: DC

## 2014-12-29 NOTE — Patient Instructions (Addendum)
My reading was 142/82.  We talked about most recent guidelines stating goal <150/90 above age 74 and balancing this with a newer study showing better health outcomes with closer to 120/80.   We opted to make no medication changes today. Increase exercise (goal 150 minutes a week but ok to start at 60 or so and build up) and improve diet with dash eating plan.   Bring your cuff to next visit. I suspect it runs a few points higher than our cuff as seen with our check and your daughters check.   3 month follow up. 90 day supplies ordered for [facility] today.   DASH Eating Plan DASH stands for "Dietary Approaches to Stop Hypertension." The DASH eating plan is a healthy eating plan that has been shown to reduce high blood pressure (hypertension). Additional health benefits may include reducing the risk of type 2 diabetes mellitus, heart disease, and stroke. The DASH eating plan may also help with weight loss. WHAT DO I NEED TO KNOW ABOUT THE DASH EATING PLAN? For the DASH eating plan, you will follow these general guidelines:  Choose foods with a percent daily value for sodium of less than 5% (as listed on the food label).  Use salt-free seasonings or herbs instead of table salt or sea salt.  Check with your health care provider or pharmacist before using salt substitutes.  Eat lower-sodium products, often labeled as "lower sodium" or "no salt added."  Eat fresh foods.  Eat more vegetables, fruits, and low-fat dairy products.  Choose whole grains. Look for the word "whole" as the first word in the ingredient list.  Choose fish and skinless chicken or [place] more often than red meat. Limit fish, poultry, and meat to 6 oz (170 g) each day.  Limit sweets, desserts, sugars, and sugary drinks.  Choose heart-healthy fats.  Limit cheese to 1 oz (28 g) per day.  Eat more home-cooked food and less restaurant, buffet, and fast food.  Limit fried foods.  Cook foods using methods other than  frying.  Limit canned vegetables. If you do use them, rinse them well to decrease the sodium.  When eating at a restaurant, ask that your food be prepared with less salt, or no salt if possible. WHAT FOODS CAN I EAT? Seek help from a dietitian for individual calorie needs. Grains Whole grain or whole wheat bread. Brown rice. Whole grain or whole wheat pasta. Quinoa, bulgur, and whole grain cereals. Low-sodium cereals. Corn or whole wheat flour tortillas. Whole grain cornbread. Whole grain crackers. Low-sodium crackers. Vegetables Fresh or frozen vegetables (raw, steamed, roasted, or grilled). Low-sodium or reduced-sodium tomato and vegetable juices. Low-sodium or reduced-sodium tomato sauce and paste. Low-sodium or reduced-sodium canned vegetables.  Fruits All fresh, canned (in natural juice), or frozen fruits. Meat and Other Protein Products Ground beef (85% or leaner), grass-fed beef, or beef trimmed of fat. Skinless chicken or [place]. Ground chicken or [place]. Pork trimmed of fat. All fish and seafood. Eggs. Dried beans, peas, or lentils. Unsalted nuts and seeds. Unsalted canned beans. Dairy Low-fat dairy products, such as skim or 1% milk, 2% or reduced-fat cheeses, low-fat ricotta or cottage cheese, or plain low-fat yogurt. Low-sodium or reduced-sodium cheeses. Fats and Oils Tub margarines without trans fats. Light or reduced-fat mayonnaise and salad dressings (reduced sodium). Avocado. Safflower, olive, or canola oils. Natural peanut or almond butter. Other Unsalted popcorn and pretzels. The items listed above may not be a complete list of recommended foods or beverages. Contact your  dietitian for more options. WHAT FOODS ARE NOT RECOMMENDED? Grains White bread. White pasta. White rice. Refined cornbread. Bagels and croissants. Crackers that contain trans fat. Vegetables Creamed or fried vegetables. Vegetables in a cheese sauce. Regular canned vegetables. Regular canned tomato sauce  and paste. Regular tomato and vegetable juices. Fruits Dried fruits. Canned fruit in light or heavy syrup. Fruit juice. Meat and Other Protein Products Fatty cuts of meat. Ribs, chicken wings, bacon, sausage, bologna, salami, chitterlings, fatback, hot dogs, bratwurst, and packaged luncheon meats. Salted nuts and seeds. Canned beans with salt. Dairy Whole or 2% milk, cream, half-and-half, and cream cheese. Whole-fat or sweetened yogurt. Full-fat cheeses or blue cheese. Nondairy creamers and whipped toppings. Processed cheese, cheese spreads, or cheese curds. Condiments Onion and garlic salt, seasoned salt, table salt, and sea salt. Canned and packaged gravies. Worcestershire sauce. Tartar sauce. Barbecue sauce. Teriyaki sauce. Soy sauce, including reduced sodium. Steak sauce. Fish sauce. Oyster sauce. Cocktail sauce. Horseradish. Ketchup and mustard. Meat flavorings and tenderizers. Bouillon cubes. Hot sauce. Tabasco sauce. Marinades. Taco seasonings. Relishes. Fats and Oils Butter, stick margarine, lard, shortening, ghee, and bacon fat. Coconut, palm kernel, or palm oils. Regular salad dressings. Other Pickles and olives. Salted popcorn and pretzels. The items listed above may not be a complete list of foods and beverages to avoid. Contact your dietitian for more information. WHERE CAN I FIND MORE INFORMATION? National Heart, Lung, and Blood Institute: travelstabloid.com Document Released: 11/28/2011 Document Revised: 04/25/2014 Document Reviewed: 10/13/2013 [facility] Patient Information 2015 [place], [place]. This information is not intended to replace advice given to you by your health care provider. Make sure you discuss any questions you have with your health care provider.

## 2014-12-29 NOTE — Progress Notes (Signed)
Garret Reddish, MD Phone: [phone]  Subjective:   Wesley Fuller is a 74 y.o. year old very pleasant male patient who presents with the following:  Hypertension-mild poor systolic control  BP Readings from Last 3 Encounters:  12/29/14 140/60  12/08/14 158/62  09/30/14 130/70  Home BP monitoring-yes ranging from 140-160/80-95 with wrist cuff. When manually checked by nurse in family it was around 140/70 Diet-admits to many poor choices No regular exercise Compliant with medications-yes without side effects, amlodipine 10mg , valsartan 320mg , hctz 25mg  ROS-Denies any CP, HA, SOB, blurry vision, LE edema, transient weakness, orthopnea, PND.   Past Medical History- Patient Active Problem List   Diagnosis Date Noted  . Essential hypertension 02/09/2008    Priority: Medium  . History of melanoma 02/09/2008    Priority: Medium  . Osteoarthritis of left knee 03/02/2014    Priority: Low  . VARICOSE VEINS LOWER EXTREMITIES W/INFLAMMATION 02/20/2010    Priority: Low  . GERD 11/21/2008    Priority: Low  . Actinic keratosis 10/12/2008    Priority: Low  . ERECTILE DYSFUNCTION 02/09/2008    Priority: Low  . RENAL CALCULUS, HX OF 02/09/2008    Priority: Low   Medications- reviewed and updated Current Outpatient Prescriptions  Medication Sig Dispense Refill  . amLODipine (NORVASC) 10 MG tablet Take 1 tablet (10 mg total) by mouth daily. 30 tablet 11  . ascorbic Acid (VITAMIN C) 500 MG CPCR Take 500 mg by mouth daily.      Marland Kitchen aspirin 81 MG tablet Take 81 mg by mouth daily.      . calcium-vitamin D (OSCAL WITH D) 500-200 MG-UNIT per tablet Take 2 tablets by mouth daily.      . hydrochlorothiazide (HYDRODIURIL) 25 MG tablet Take 1 tablet (25 mg total) by mouth daily. 30 tablet 11  . Multiple Vitamins-Minerals (ICAPS MV PO) Take by mouth daily.      Marland Kitchen omeprazole (PRILOSEC) 40 MG capsule TAKE 1 CAPSULE BY MOUTH DAILY 30 MINTUES BEFORE AMEAL 90 capsule 5  . valsartan (DIOVAN) 320 MG  tablet Take 1 tablet (320 mg total) by mouth daily. 30 tablet 11  . tadalafil (CIALIS) 20 MG tablet Take 0.5 tablets (10 mg total) by mouth daily as needed for erectile dysfunction. (Patient not taking: Reported on 12/29/2014) 10 tablet 0   Objective: BP 140/60 mmHg  Temp(Src) 98.1 F (36.7 C)  Wt 202 lb (91.627 kg)  Manual recheck 142/82 Gen: NAD, resting comfortably in chair CV: RRR no murmurs rubs or gallops Lungs: CTAB no crackles, wheeze, rhonchi Abdomen: soft/nontender/nondistended/normal bowel sounds.  Ext: no edema Skin: warm, dry, no rash   Assessment/Plan:  Essential hypertension Long discussion about BP goals including JNC 8 advice for 150/90 in his demographic conflicting with newer data suggesting closer to 120/80. We made a plan to work on diet/exercise/DASH eating plan and follow up in 3 months while continuing Amlodipine 10mg , valsartan 320 mg, hctz 25mg    Return precautions advised. 3 month follow up and verify home cuff. Consider beta blocker.   Meds ordered this encounter  Medications  . amLODipine (NORVASC) 10 MG tablet    Sig: Take 1 tablet (10 mg total) by mouth daily.    Dispense:  90 tablet    Refill:  3  . hydrochlorothiazide (HYDRODIURIL) 25 MG tablet    Sig: Take 1 tablet (25 mg total) by mouth daily.    Dispense:  90 tablet    Refill:  3  . valsartan (DIOVAN) 320 MG tablet  Sig: Take 1 tablet (320 mg total) by mouth daily.    Dispense:  90 tablet    Refill:  3

## 2014-12-29 NOTE — Assessment & Plan Note (Signed)
Long discussion about BP goals including JNC 8 advice for 150/90 in his demographic conflicting with newer data suggesting closer to 120/80. We made a plan to work on diet/exercise/DASH eating plan and follow up in 3 months while continuing Amlodipine 10mg , valsartan 320 mg, hctz 25mg 

## 2015-02-10 ENCOUNTER — Other Ambulatory Visit: Payer: Self-pay | Admitting: Urology

## 2015-02-22 ENCOUNTER — Encounter (HOSPITAL_COMMUNITY): Payer: Self-pay | Admitting: *Deleted

## 2015-02-25 NOTE — H&P (Signed)
History of Present Illness Wesley Fuller is a 74 year old with the following urologic history:    1) Nephrolithiasis: He has a history of hyperoxaluria and hypercalciuria and calcium oxalate monohydrate nephrolithiasis. He is currently treated with calcium citrate two tablets per day and hydrochlorothiazide 25 mg daily.  Last metabolic evaluation: June 2012    2) Erectile dysfunction: He does have erectile dysfunction which has been managed with Viagra 25 mg successfully.    Interval history:    He follows up today for further evaluation of his bilateral renal calculi and erectile dysfunction. He has been completely asymptomatic over the past year and denies any hematuria, flank pain, or passage of any stones. He has remained compliant on medical therapy with hydrochlorothiazide and calcium citrate. He has found Cialis 20 mg to be more effective than Viagra even at a 100 mg dose. However, he does have some concerns about the cost of this medication. He wishes to explore his alternative options.     Past Medical History Problems  1. History of Asthma (J45.909) 2. History of hypertension (Z86.79) 3. Nephrolithiasis (N20.0)  Surgical History Problems  1. History of Inguinal Hernia Repair 2. History of Shoulder Surgery 3. History of Tonsillectomy 4. History of Varicose Vein Ligation  Current Meds 1. AmLODIPine Besylate 10 MG Oral Tablet;  Therapy: (Recorded:18Feb2016) to Recorded 2. Ascorbic Acid TABS;  Therapy: (Recorded:19Jun2013) to Recorded 3. Aspirin 81 MG Oral Tablet Chewable;  Therapy: (Recorded:19Jun2013) to Recorded 4. Calcium Carbonate 600 MG Oral Tablet;  Therapy: (Recorded:19Jun2013) to Recorded 5. Cialis 20 MG Oral Tablet;  Therapy: (Recorded:18Feb2016) to Recorded 6. Diovan 320 MG Oral Tablet;  Therapy: (Recorded:18Feb2016) to Recorded 7. Exforge HCT 10-320-25 MG Oral Tablet;  Therapy: (Recorded:31Mar2011) to Recorded 8. Hydrochlorothiazide 25 MG Oral Tablet;  TAKE 1 TABLET DAILY;  Therapy: 02Sep2014 to (Last Rx:16Jan2015)  Requested for: [id number] Ordered 9. Multi-Vitamin TABS;  Therapy: (Recorded:31Mar2011) to Recorded 10. Viagra TABS;   Therapy: (Recorded:15Jan2008) to Recorded  Allergies Medication  1. No Known Drug Allergies  Family History Problems  1. Family history of Colon Cancer : Father 2. Family history of Colon Cancer : Paternal Uncle 3. Family history of Nephrolithiasis : Brother  Social History Problems  1. Alcohol Use   less than 1 2. Marital History - Currently Married 3. Never A Smoker 4. Occupation:   retired 38. Denied: Tobacco Use  Vitals Vital Signs [Data Includes: Last 1 Day]  Recorded: [id number] 03:54PM  Weight: 198 lb  BMI Calculated: 26.85 BSA Calculated: 2.12 Blood Pressure: 151 / 79 Temperature: 97.8 F Heart Rate: 71  Physical Exam Constitutional: Well nourished and well developed . No acute distress.  ENT:. The ears and nose are normal in appearance.  Neck: The appearance of the neck is normal and no neck mass is present.  Pulmonary: No respiratory distress and normal respiratory rhythm and effort.  Cardiovascular: Heart rate and rhythm are normal . No peripheral edema.  Abdomen: The abdomen is soft and nontender. No CVA tenderness.  Skin: Normal skin turgor, no visible rash and no visible skin lesions.  Neuro/Psych:. Mood and affect are appropriate.    Results/Data Urine [Data Includes: Last 1 Day]   [id number]  COLOR YELLOW   APPEARANCE CLEAR   SPECIFIC GRAVITY 1.020   pH 6.5   GLUCOSE NEG mg/dL  BILIRUBIN NEG   KETONE NEG mg/dL  BLOOD NEG   PROTEIN NEG mg/dL  UROBILINOGEN 0.2 mg/dL  NITRITE NEG   LEUKOCYTE ESTERASE NEG    I  have independently reviewed his KUB x-ray today. He continues to have a large left renal calculus in the left renal pelvis. This has increased in size and currently measures 12.4 mm which is increased from approximately 11 mm one year ago. Furthermore, there  appears to be a smaller 2.6 mm calcification in the lower pole of left kidney. Over the right renal shadow, there appears to be calcification likely indicating a conglomeration of stones now measuring 9.4 mm and increased in size compared to 6.5 mm last year. There are no calcifications along the expected course of the ureters bilaterally.   Plan Health Maintenance  1. UA With REFLEX; [Do Not Release]; Status:Complete;   Done: [id number] 03:21PM Nephrolithiasis  2. Follow-up Office  Follow-up - will call to schedule surgery  Status: Hold For -  Appointment,Date of Service  Requested for: [id number]  Discussion/Summary 1. Bilateral renal calculi: We discussed the interval stone growth over the past year. Considering the interval stone growth, we reviewed options including continued observation versus definitive surgical treatment. After discussing the pros and cons of observation versus intervention, Wesley Fuller did express interest in intervention to avoid necessitating a more invasive procedure in the future. We have discussed all available options including shockwave lithotripsy, ureteroscopic laser lithotripsy, and percutaneous nephrostolithotomy. After reviewing each of these therapies, he expressed most interested shockwave lithotripsy and we will plan to proceed with left extracorporeal shockwave lithotripsy in the near future. We have reviewed the potential risks including but not limited to bleeding, infection, injury or damage and possible loss of the kidney, failure to fragment the stone, and need for further procedures. We discussed the expected recovery process. This will be scheduled electively at the patient's convenience.    Following treatment, he is potentially interested in treating his right renal calculus and he understands and we will have a better idea of how successful shockwave lithotripsy might be after treatment of his left stone.    2. Erectile dysfunction: We reviewed  options. He did express some interest in generic sildenafil due to the potential cost savings. I did not have any samples of Viagra for him to try to make sure that this medication was still effective for him and he will remind me of this in the future so we can see if we can get him a few samples before he makes a purchase of a large prescription.    Cc: Dr. Benay Pillow     Signatures Electronically signed by : Raynelle Bring, M.D.; Feb 09 2015  6:04PM EST

## 2015-02-27 ENCOUNTER — Encounter (HOSPITAL_COMMUNITY)
Admission: RE | Disposition: A | Discharge status: Home / Self Care | Payer: Self-pay | Source: Ambulatory Visit | Attending: Urology

## 2015-02-27 ENCOUNTER — Ambulatory Visit (HOSPITAL_COMMUNITY): Payer: Medicare HMO

## 2015-02-27 ENCOUNTER — Ambulatory Visit (HOSPITAL_COMMUNITY)
Admission: RE | Admit: 2015-02-27 | Discharge: 2015-02-27 | Disposition: A | Discharge status: Home / Self Care | Payer: Medicare HMO | Source: Ambulatory Visit | Attending: Urology | Admitting: Urology

## 2015-02-27 ENCOUNTER — Encounter (HOSPITAL_COMMUNITY): Payer: Self-pay | Admitting: *Deleted

## 2015-02-27 DIAGNOSIS — I10 Essential (primary) hypertension: Secondary | ICD-10-CM | POA: Diagnosis not present

## 2015-02-27 DIAGNOSIS — N200 Calculus of kidney: Secondary | ICD-10-CM

## 2015-02-27 DIAGNOSIS — Z7982 Long term (current) use of aspirin: Secondary | ICD-10-CM | POA: Diagnosis not present

## 2015-02-27 DIAGNOSIS — N529 Male erectile dysfunction, unspecified: Secondary | ICD-10-CM | POA: Insufficient documentation

## 2015-02-27 DIAGNOSIS — J45909 Unspecified asthma, uncomplicated: Secondary | ICD-10-CM | POA: Diagnosis not present

## 2015-02-27 DIAGNOSIS — Z79899 Other long term (current) drug therapy: Secondary | ICD-10-CM | POA: Insufficient documentation

## 2015-02-27 HISTORY — DX: Unspecified asthma, uncomplicated: J45.909

## 2015-02-27 SURGERY — LITHOTRIPSY, ESWL
Anesthesia: LOCAL | Laterality: Left

## 2015-02-27 MED ORDER — CIPROFLOXACIN HCL 500 MG PO TABS
500.0000 mg | ORAL_TABLET | ORAL | Status: AC
  Administered 2015-02-27: 500 mg via ORAL
  Filled 2015-02-27: qty 1

## 2015-02-27 MED ORDER — DIPHENHYDRAMINE HCL 25 MG PO CAPS
25.0000 mg | ORAL_CAPSULE | ORAL | Status: AC
  Administered 2015-02-27: 25 mg via ORAL
  Filled 2015-02-27: qty 1

## 2015-02-27 MED ORDER — DEXTROSE-NACL 5-0.45 % IV SOLN
INTRAVENOUS | Status: DC
  Administered 2015-02-27: 15:00:00 via INTRAVENOUS

## 2015-02-27 MED ORDER — DIAZEPAM 5 MG PO TABS
10.0000 mg | ORAL_TABLET | ORAL | Status: AC
  Administered 2015-02-27: 10 mg via ORAL
  Filled 2015-02-27: qty 2

## 2015-02-27 NOTE — Interval H&P Note (Signed)
History and Physical Interval Note:  02/27/2015 3:33 PM  Wesley Fuller  has presented today for surgery, with the diagnosis of LEFT RENAL CALCULUS  The various methods of treatment have been discussed with the patient and family. After consideration of risks, benefits and other options for treatment, the patient has consented to  Procedure(s): LEFT EXTRACORPOREAL SHOCK WAVE LITHOTRIPSY (ESWL) (Left) as a surgical intervention .  The patient's history has been reviewed, patient examined, no change in status, stable for surgery.  I have reviewed the patient's chart and labs.  Questions were answered to the patient's satisfaction.     Megan Presti,LES

## 2015-02-27 NOTE — Discharge Instructions (Signed)
1. You should strain your urine and collect all fragments and bring them to your follow up appointment.  °2. You should take your pain medication as needed.  Please call if your pain is severe to the point that it is not controlled with your pain medication. °3. You should call if you develop fever > 101 or persistent nausea or vomiting. °4. Your doctor may prescribe tamsulosin to take to help facilitate stone passage. °

## 2015-02-27 NOTE — Op Note (Signed)
Operative note for ESWL is in scanned chart.

## 2015-03-30 ENCOUNTER — Ambulatory Visit (INDEPENDENT_AMBULATORY_CARE_PROVIDER_SITE_OTHER): Payer: Medicare HMO | Admitting: Family Medicine

## 2015-03-30 ENCOUNTER — Encounter: Payer: Self-pay | Admitting: Family Medicine

## 2015-03-30 VITALS — BP 120/64 | HR 72 | Temp 98.3°F | Wt 200.0 lb

## 2015-03-30 DIAGNOSIS — N401 Enlarged prostate with lower urinary tract symptoms: Secondary | ICD-10-CM

## 2015-03-30 DIAGNOSIS — R351 Nocturia: Secondary | ICD-10-CM

## 2015-03-30 DIAGNOSIS — F528 Other sexual dysfunction not due to a substance or known physiological condition: Secondary | ICD-10-CM

## 2015-03-30 DIAGNOSIS — I10 Essential (primary) hypertension: Secondary | ICD-10-CM | POA: Diagnosis not present

## 2015-03-30 MED ORDER — TADALAFIL 20 MG PO TABS: 10.0000 mg | ORAL_TABLET | Freq: Every day | ORAL | Status: DC | PRN

## 2015-03-30 NOTE — Assessment & Plan Note (Signed)
S: poor control when off meds. 10mg  has not been effective. Requests sample or rx.  A/P: refilled 20mg  as no samples available of cialis.

## 2015-03-30 NOTE — Patient Instructions (Signed)
Physical in 6 months  BP looks great. Let's keep an eye on the fatigue. If it does not continue to improve, let me know either by message or by visit and we could consider reducing BP meds if still so well controlled.   Refilled cialis.

## 2015-03-30 NOTE — Progress Notes (Signed)
Garret Reddish, MD Phone: [phone]  Subjective:   Wesley Fuller is a 74 y.o. year old very pleasant male patient who presents with the following:  Hypertension-controlled  BP Readings from Last 3 Encounters:  03/30/15 120/64  02/27/15 106/59  12/29/14 140/60   Home BP monitoring-yes with all #s <130/75 over last month, most recently 121/64. Cuff correlates Compliant with medications-yes without side effects.  Being more active- yardwork, [employer], golfing Diet improved due to wife in weight watchers ROS-Denies any CP, HA, SOB, blurry vision, LE edema. Some fatigue with change in BP meds. Nocturia 2x a night though not worsening.   Past Medical History- Patient Active Problem List   Diagnosis Date Noted  . Essential hypertension 02/09/2008    Priority: Medium  . History of melanoma 02/09/2008    Priority: Medium  . Osteoarthritis of left knee 03/02/2014    Priority: Low  . VARICOSE VEINS LOWER EXTREMITIES W/INFLAMMATION 02/20/2010    Priority: Low  . GERD 11/21/2008    Priority: Low  . Actinic keratosis 10/12/2008    Priority: Low  . ERECTILE DYSFUNCTION 02/09/2008    Priority: Low  . RENAL CALCULUS, HX OF 02/09/2008    Priority: Low   Medications- reviewed and updated Current Outpatient Prescriptions  Medication Sig Dispense Refill  . amLODipine (NORVASC) 10 MG tablet Take 1 tablet (10 mg total) by mouth daily. 90 tablet 3  . calcium-vitamin D (OSCAL WITH D) 500-200 MG-UNIT per tablet Take 2 tablets by mouth every morning.     . hydrochlorothiazide (HYDRODIURIL) 25 MG tablet Take 1 tablet (25 mg total) by mouth daily. 90 tablet 3  . Multiple Vitamins-Minerals (MULTIVITAMIN GUMMIES ADULT PO) Take 2 each by mouth every morning.    Marland Kitchen omeprazole (PRILOSEC) 40 MG capsule TAKE 1 CAPSULE BY MOUTH DAILY 30 MINTUES BEFORE AMEAL 90 capsule 5  . OVER THE COUNTER MEDICATION Take 2 tablets by mouth every morning. Focus Factor. Vitamin A, C ,D,E, thiamine, niacin, b-6,b-12 and  biotin    . polyvinyl alcohol (LIQUIFILM TEARS) 1.4 % ophthalmic solution Place 1-2 drops into both eyes daily as needed for dry eyes.    . Sodium Chloride-Sodium Bicarb (NETI POT SINUS WASH NA) Place 1 each into the nose daily.    . tadalafil (CIALIS) 20 MG tablet Take 0.5 tablets (10 mg total) by mouth daily as needed for erectile dysfunction. 10 tablet 11  . naproxen sodium (ANAPROX) 220 MG tablet Take 220-440 mg by mouth 2 (two) times daily as needed (knee pain.).     Objective: BP 120/64 mmHg  Pulse 72  Temp(Src) 98.3 F (36.8 C)  Wt 200 lb (90.719 kg) Gen: NAD, resting comfortably in chair, appears younger than stated age CV: RRR no murmurs rubs or gallops Lungs: CTAB no crackles, wheeze, rhonchi Abdomen: soft/nontender/nondistended/normal bowel sounds. No rebound or guarding.  Ext: no edema Skin: warm, dry, no rash Neuro: grossly normal, moves all extremities, normal gait  Assessment/Plan:  Essential hypertension Controlled with Amlodipine 10mg , valsartan 320 mg, hctz 25mg . Consider reduce HCTZ to 12.5mg  if fatigue persists.    ERECTILE DYSFUNCTION S: poor control when off meds. 10mg  has not been effective. Requests sample or rx.  A/P: refilled 20mg  as no samples available of cialis.      6 month CPE with labs before fasting Orders Placed This Encounter  Procedures  . CBC    [place]    Standing Status: Future     Number of Occurrences:      Standing  Expiration Date: 03/29/2016  . Comprehensive metabolic panel    [place]    Standing Status: Future     Number of Occurrences:      Standing Expiration Date: 03/29/2016    Order Specific Question:  Has the patient fasted?    Answer:  No  . Lipid panel    [place]    Standing Status: Future     Number of Occurrences:      Standing Expiration Date: 03/29/2016    Order Specific Question:  Has the patient fasted?    Answer:  No  . PSA    Standing Status: Future     Number of Occurrences:      Standing Expiration Date:  03/29/2016    Meds ordered this encounter  Medications  . tadalafil (CIALIS) 20 MG tablet    Sig: Take 0.5 tablets (10 mg total) by mouth daily as needed for erectile dysfunction.    Dispense:  10 tablet    Refill:  11

## 2015-03-30 NOTE — Assessment & Plan Note (Signed)
Controlled with Amlodipine 10mg , valsartan 320 mg, hctz 25mg . Consider reduce HCTZ to 12.5mg  if fatigue persists.

## 2015-04-24 ENCOUNTER — Encounter: Payer: Self-pay | Admitting: Gastroenterology

## 2015-05-02 ENCOUNTER — Telehealth: Payer: Self-pay | Admitting: Family Medicine

## 2015-05-02 NOTE — Telephone Encounter (Signed)
Have him take 1/2 tab of the HCTZ. Happy to see him 2 weeks from change or he could call in with BPs as well as report on fatigue and dizziness at that time.

## 2015-05-02 NOTE — Telephone Encounter (Signed)
See below

## 2015-05-02 NOTE — Telephone Encounter (Signed)
Pt states he has been somewhat dizzy at times and lethargic,.  Pt feels like he may need to review his BP med Pt currently on  valsartan (DIOVAN) 320 MG tablet  amLODipine (NORVASC) 10 MG tablet hydrochlorothiazide (HYDRODIURIL) 25 MG tablet Pt is pleased w/ his progress Last  Readings from April to may: 133/73   121/73  115/64  128/65  130/72  pls advise

## 2015-05-03 NOTE — Telephone Encounter (Signed)
LM on pt vm TCB.

## 2015-05-03 NOTE — Telephone Encounter (Signed)
Pt returned call and notified of Dr. Yong Channel recommendation

## 2015-05-09 ENCOUNTER — Telehealth: Payer: Self-pay | Admitting: Family Medicine

## 2015-05-09 NOTE — Telephone Encounter (Signed)
Patient Name: Wesley Fuller DOB: 1941-04-12 Initial Comment Caller states her husband is dizzy. Nurse Assessment Nurse: Marcelline Deist, RN, Lynda Date/Time (Eastern Time): 05/09/2015 1:39:52 PM Confirm and document reason for call. If symptomatic, describe symptoms. ---Caller states her husband is dizzy. Had been to the dentist earlier this am, was "on his head" practically for the procedure, was a little woozy after that. When he returned, he had dizziness when he made a sudden move. Had to sit down. Valsartan & Amlodipine are medications he takes, since Jan. Was on HCTZ, not taking it for 10 days d/t being prescribed other rxs. His Dr. is aware of his brief episodes of dizziness. Felt very tired before. His BP has been good when he monitors it at home. Has the patient traveled out of the country within the last 30 days? ---Not Applicable Does the patient require triage? ---Yes Related visit to physician within the last 2 weeks? ---Yes Does the PT have any chronic conditions? (i.e. diabetes, asthma, etc.) ---Yes List chronic conditions. ---BP rx. Guidelines Guideline Title Affirmed Question Affirmed Notes Dizziness - Lightheadedness Taking a medicine that could cause dizziness (e.g., blood pressure medications, diuretics) Final Disposition User See Physician within [place], RN, Kermit Balo Comments Caller does not feel he needs an appt. at this time to be seen, but would like any further feedback relayed from his Dr. he can be reached at this #. He feels the dizzy episode may have been a result of his trip to the dentist, but wants his Dr. to be aware.

## 2015-05-09 NOTE — Telephone Encounter (Signed)
I agree could be related to going to dentist.   Clarify a few things for me Wesley Fuller: 1. Is he or is he not taking HCTZ 12.5 mg. My understanding was we said take a 1/2 tab.  2. Update me on his recent home blood pressures.  3. Is he still dealing with dizziness? Is he just lightheaded or does the room feel like it is spinning

## 2015-05-09 NOTE — Telephone Encounter (Signed)
Followed up with pt Wesley Fuller.

## 2015-05-09 NOTE — Telephone Encounter (Signed)
PT had not been taking 12.5mg  but he is not taking any at the time since he last saw [place] and the room is not spinning he was just light headed then but now he is fine.

## 2015-05-09 NOTE — Telephone Encounter (Signed)
Juluis Rainier MD [place] please advise.

## 2015-05-16 ENCOUNTER — Encounter: Payer: Self-pay | Admitting: Family Medicine

## 2015-05-16 ENCOUNTER — Ambulatory Visit (INDEPENDENT_AMBULATORY_CARE_PROVIDER_SITE_OTHER): Payer: Medicare HMO | Admitting: Family Medicine

## 2015-05-16 VITALS — BP 138/76 | HR 66 | Temp 98.3°F | Wt 200.0 lb

## 2015-05-16 DIAGNOSIS — I10 Essential (primary) hypertension: Secondary | ICD-10-CM

## 2015-05-16 DIAGNOSIS — M1712 Unilateral primary osteoarthritis, left knee: Secondary | ICD-10-CM | POA: Diagnosis not present

## 2015-05-16 MED ORDER — AMLODIPINE BESYLATE-VALSARTAN 10-320 MG PO TABS: 1.0000 | ORAL_TABLET | Freq: Every day | ORAL | Status: DC

## 2015-05-16 NOTE — Assessment & Plan Note (Signed)
Controlled on Amlodipine 10mg , valsartan 320 mg, hctz 12.5mg . Fatigue better down from 25mg  HCTZ. Will try to combine amlodipine and valsartan into 1 pill if cost effective.

## 2015-05-16 NOTE — Assessment & Plan Note (Signed)
Asked my opinion on knee replacement. Advised he "would know". On other hand, relatively healthy and I believe he could get around with grandkids better and do more things he would like to if had knee replacement. Has been at least a year since [facility] ortho visit and I encouraged him to follow up and talk about options such as knee replacement, pain control options, repeat injections?

## 2015-05-16 NOTE — Patient Instructions (Addendum)
Blood pressure looks better especially at home. Continue current medicine with full tab amlodipine, full tab of valsartan, 1/2 tab of hctz  I sent in a combination pill of the amlodipine-valsartan which you can start the day you run out of your other medicines.   Let's do a physical sometime between October and December. We can decide on labs to do on the day of- come in fasting

## 2015-05-16 NOTE — Progress Notes (Signed)
Garret Reddish, MD  Subjective:  Wesley Fuller is a 74 y.o. year old very pleasant male patient who presents with:  Hypertension-controlled  BP Readings from Last 3 Encounters:  05/16/15 138/76  03/30/15 120/64  02/27/15 106/59   Home BP monitoring-home numbers (cuff verified last visit) usually in 120s or 130s over 46s or 70s. Had 1 reading since last visit at 142.  Compliant with medications-yes without sig side effects- does have some fatigue that improved on 1/2 dose of hctz ROS-Denies any CP, HA, SOB, blurry vision, LE edema.   Left knee oA -asks my opinion on knee replacement. Cant run with grandkids due to pain or get down on hands and knees. Synvisc 18 months ago has helped some. Can play golf without too great discomfort ROS- no locking/giving way.   Past Medical History- history of melanoma, ED, GERD  Medications- reviewed and updated Current Outpatient Prescriptions  Medication Sig Dispense Refill  . amLODipine (NORVASC) 10 MG tablet Take 1 tablet (10 mg total) by mouth daily. 90 tablet 3  . calcium-vitamin D (OSCAL WITH D) 500-200 MG-UNIT per tablet Take 2 tablets by mouth every morning.     . hydrochlorothiazide (HYDRODIURIL) 25 MG tablet Take 1 tablet (25 mg total) by mouth daily. 90 tablet 3  . Multiple Vitamins-Minerals (MULTIVITAMIN GUMMIES ADULT PO) Take 2 each by mouth every morning.    Marland Kitchen omeprazole (PRILOSEC) 40 MG capsule TAKE 1 CAPSULE BY MOUTH DAILY 30 MINTUES BEFORE AMEAL 90 capsule 5  . OVER THE COUNTER MEDICATION Take 2 tablets by mouth every morning. Focus Factor. Vitamin A, C ,D,E, thiamine, niacin, b-6,b-12 and biotin    . polyvinyl alcohol (LIQUIFILM TEARS) 1.4 % ophthalmic solution Place 1-2 drops into both eyes daily as needed for dry eyes.    . Sodium Chloride-Sodium Bicarb (NETI POT SINUS WASH NA) Place 1 each into the nose daily.     Objective: BP 138/76 mmHg  Pulse 66  Temp(Src) 98.3 F (36.8 C)  Wt 200 lb (90.719 kg) Gen: NAD, resting  comfortably CV: RRR no murmurs rubs or gallops Lungs: CTAB no crackles, wheeze, rhonchi Abdomen: soft/nontender/nondistended/normal bowel sounds. No rebound or guarding.  Ext: no edema Skin: warm, dry Neuro: grossly normal, moves all extremities   Assessment/Plan:  Essential hypertension Controlled on Amlodipine 10mg , valsartan 320 mg, hctz 12.5mg . Fatigue better down from 25mg  HCTZ. Will try to combine amlodipine and valsartan into 1 pill if cost effective.    Osteoarthritis of left knee Asked my opinion on knee replacement. Advised he "would know". On other hand, relatively healthy and I believe he could get around with grandkids better and do more things he would like to if had knee replacement. Has been at least a year since [place] ortho visit and I encouraged him to follow up and talk about options such as knee replacement, pain control options, repeat injections?    Meds ordered this encounter  Medications  . amLODipine-valsartan (EXFORGE) 10-320 MG per tablet    Sig: Take 1 tablet by mouth daily.    Dispense:  90 tablet    Refill:  3  . sildenafil (REVATIO) 20 MG tablet    Sig: Take 20 mg by mouth daily as needed (takes 1-5 tablets for erectile dysfunction per urology).

## 2015-06-13 ENCOUNTER — Encounter: Payer: Self-pay | Admitting: Family Medicine

## 2015-07-24 ENCOUNTER — Encounter: Payer: Self-pay | Admitting: Family Medicine

## 2015-07-24 ENCOUNTER — Telehealth: Payer: Self-pay | Admitting: Family Medicine

## 2015-07-24 ENCOUNTER — Ambulatory Visit (INDEPENDENT_AMBULATORY_CARE_PROVIDER_SITE_OTHER): Payer: Medicare HMO | Admitting: Family Medicine

## 2015-07-24 VITALS — BP 128/78 | HR 66 | Temp 98.7°F | Ht 72.0 in | Wt 195.4 lb

## 2015-07-24 DIAGNOSIS — I10 Essential (primary) hypertension: Secondary | ICD-10-CM

## 2015-07-24 MED ORDER — AMLODIPINE BESYLATE-VALSARTAN 5-320 MG PO TABS: 1.0000 | ORAL_TABLET | Freq: Every day | ORAL | Status: DC

## 2015-07-24 NOTE — Progress Notes (Signed)
HPI:  Wesley Fuller is a pleasant and comfortable appearing 74 yo M pt of Dr. Yong Channel here for an acute visit for:  Fatigue: -reports fatigue mild since starting a new BP medications in the last 6 months or so, has been communicating with PCP regarding this but wants to try to change medications to see if this helps -current meds: amlodipine-valsartan 10-320; hctz 1/2 tablet daily -BP has been great but he has been reading side effects to the combo med and is concerned about this and he feels BP has been actually low at home 90-120s SBP and 60-70s DBP -nurse notes remark on back pain which he adamantly denies -denies: CP, SOB, fevers, malaise, palpitations, changes in bowels, back pain, weakness, numbness, urinary symptoms, cough or any other symptoms -does have chronic lower ext edema    ROS: See pertinent positives and negatives per HPI.  Past Medical History  Diagnosis Date  . Hypertension   . ED (erectile dysfunction)   . Calcium oxalate renal stones   . Diverticulosis   . Nail fungal infection   . Cataract 2013  . Allergy     seasonal  . Melanoma of back 1974  . GERD (gastroesophageal reflux disease)   . Osteoarthritis of left knee 03/02/2014  . Asthma     as a child    Past Surgical History  Procedure Laterality Date  . Skin cancer    . Tonsillectomy  1974  . Lymph node disection  1981  . Melanoma removal of back   1974  . Left leg stripping    . Hernia repair    . Nasal polyp surgery      Family History  Problem Relation Age of Onset  . Colon cancer Father   . Colon cancer Paternal Uncle   . Hypertension    . Colon cancer Paternal Uncle     History   Social History  . Marital Status: Married    Spouse Name: N/A  . Number of Children: N/A  . Years of Education: N/A   Social History Main Topics  . Smoking status: Never Smoker   . Smokeless tobacco: Never Used  . Alcohol Use: 3.0 oz/week    5 Glasses of wine per week     Comment: moderate  . Drug  Use: No  . Sexual Activity: Yes   Other Topics Concern  . None   Social History Narrative   Married ([name]) 1965. 2 sons- 1 in [place]. 3 grandsons.       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  calcium-vitamin D (OSCAL WITH D) 500-200 MG-UNIT per tablet, Take 2 tablets by mouth every morning. , Disp: , Rfl:  .  hydrochlorothiazide (HYDRODIURIL) 25 MG tablet, Take 1 tablet (25 mg total) by mouth daily., Disp: 90 tablet, Rfl: 3 .  Multiple Vitamins-Minerals (MULTIVITAMIN GUMMIES ADULT PO), Take 2 each by mouth every morning., Disp: , Rfl:  .  omeprazole (PRILOSEC) 40 MG capsule, TAKE 1 CAPSULE BY MOUTH DAILY 30 MINTUES BEFORE AMEAL, Disp: 90 capsule, Rfl: 5 .  OVER THE COUNTER MEDICATION, Take 2 tablets by mouth every morning. Focus Factor. Vitamin A, C ,D,E, thiamine, niacin, b-6,b-12 and biotin, Disp: , Rfl:  .  polyvinyl alcohol (LIQUIFILM TEARS) 1.4 % ophthalmic solution, Place 1-2 drops into both eyes daily as needed for dry eyes., Disp: , Rfl:  .  sildenafil (REVATIO) 20 MG tablet, Take 20 mg by mouth daily as needed (takes 1-5 tablets for erectile dysfunction per urology)., Disp: , Rfl:  .  Sodium Chloride-Sodium Bicarb (NETI POT SINUS [place] NA), Place 1 each into the nose daily., Disp: , Rfl:  .  amLODipine-valsartan (EXFORGE) 5-320 MG per tablet, Take 1 tablet by mouth daily., Disp: 30 tablet, Rfl: 0  EXAM:  Filed Vitals:   07/24/15 1254  BP: 128/78  Pulse: 66  Temp: 98.7 F (37.1 C)    Body mass index is 26.5 kg/(m^2).  GENERAL: vitals reviewed and listed above, alert, oriented, appears well hydrated and in no acute distress, appears very comfortable on ambulation down hall with my assistant  HEENT: atraumatic, conjunttiva clear, no obvious abnormalities on inspection of external nose and ears  NECK: no obvious masses on  inspection  LUNGS: clear to auscultation bilaterally, no wheezes, rales or rhonchi, good air movement  CV: HRRR, no peripheral edema  MS: moves all extremities without noticeable abnormality Normal Gait Normal inspection of back, no obvious scoliosis or leg length descrepancy No bony TTP Soft tissue TTP at: -/+ tests: neg trendelenburg,-facet loading, -SLRT, -CLRT, -FABER, -FADIR Normal muscle strength, sensation to light touch and DTRs in LEs bilaterally  PSYCH: pleasant and cooperative, no obvious depression or anxiety  ASSESSMENT AND PLAN:  Discussed the following assessment and plan:  Essential hypertension - Plan: amLODipine-valsartan (EXFORGE) 5-320 MG per tablet  -Patient advised to return or notify a doctor immediately if symptoms worsen or persist or new concerns arise.  Patient Instructions  BEFORE YOU LEAVE: -schedule follow with Dr. Yong Channel in 2-3 weeks  Stop the current combination blood pressure medication and pick up the new combo that has a lower dose of Amlodipine (5 mg) - take this daily     Temple Ewart R.

## 2015-07-24 NOTE — Progress Notes (Signed)
Pre visit review using our clinic review tool, if applicable. No additional management support is needed unless otherwise documented below in the visit note. 

## 2015-07-24 NOTE — Patient Instructions (Signed)
BEFORE YOU LEAVE: -schedule follow with Dr. Yong Channel in 2-3 weeks  Stop the current combination blood pressure medication and pick up the new combo that has a lower dose of Amlodipine (5 mg) - take this daily

## 2015-07-24 NOTE — Telephone Encounter (Signed)
Patient Name: Wesley Fuller DOB: Jul 13, 1941 Initial Comment caller states he has fatigue and backs of his legs feel tingly Nurse Assessment Nurse: Vallery Sa, RN, [place] Date/Time (Eastern Time): 07/24/2015 10:44:36 AM Confirm and document reason for call. If symptomatic, describe symptoms. ---Caller states he developed tingling numbness in his legs and increasing fatigue for about 2 months ago. He has had middle back back for the past 2 months. No injury in the past 3 days. No fever. No severe breathing difficulty. Alert and responsive. Has the patient traveled out of the country within the last 30 days? ---No Does the patient require triage? ---Yes Related visit to physician within the last 2 weeks? ---No Does the PT have any chronic conditions? (i.e. diabetes, asthma, etc.) ---Yes List chronic conditions. ---High Blood Pressure, Back and knee problems Guidelines Guideline Title Affirmed Question Affirmed Notes Back Pain [1] Pain radiates into the thigh or further down the leg AND [2] both legs Final Disposition User See Physician within 4 Hours (or PCP triage) Vallery Sa, RN, C[place] Comments Scheduled for 1pm appointment today with Dr. Colin Benton. Referrals REFERRED TO PCP OFFICE Disagree/Comply: Comply

## 2015-08-10 ENCOUNTER — Encounter: Payer: Self-pay | Admitting: Family Medicine

## 2015-08-22 ENCOUNTER — Ambulatory Visit (INDEPENDENT_AMBULATORY_CARE_PROVIDER_SITE_OTHER): Payer: Medicare HMO | Admitting: Family Medicine

## 2015-08-22 ENCOUNTER — Encounter: Payer: Self-pay | Admitting: Family Medicine

## 2015-08-22 VITALS — BP 140/82 | HR 69 | Temp 98.7°F | Wt 194.0 lb

## 2015-08-22 DIAGNOSIS — F0390 Unspecified dementia without behavioral disturbance: Secondary | ICD-10-CM | POA: Insufficient documentation

## 2015-08-22 DIAGNOSIS — G3184 Mild cognitive impairment, so stated: Secondary | ICD-10-CM

## 2015-08-22 DIAGNOSIS — I10 Essential (primary) hypertension: Secondary | ICD-10-CM | POA: Diagnosis not present

## 2015-08-22 DIAGNOSIS — Z23 Encounter for immunization: Secondary | ICD-10-CM

## 2015-08-22 DIAGNOSIS — F03A0 Unspecified dementia, mild, without behavioral disturbance, psychotic disturbance, mood disturbance, and anxiety: Secondary | ICD-10-CM | POA: Insufficient documentation

## 2015-08-22 MED ORDER — AMLODIPINE BESYLATE-VALSARTAN 5-320 MG PO TABS: 1.0000 | ORAL_TABLET | Freq: Every day | ORAL | Status: DC

## 2015-08-22 NOTE — Patient Instructions (Signed)
Blood pressure is higher but I am glad your symptoms are better. Goal for >60 is <150/90 but I would like to at least keep you <145/90. Let's follow up in September. COntinue to check blood pressure at home.   You scored 27/30 on your mini-mental status exam. THis is not at dementia level but could reflect "mild cognitive impairment". We will do further evaluation/blood testing at next visit and talk about next steps. Still good for this to be a 30 minute visit because there is a bit to discuss.

## 2015-08-22 NOTE — Assessment & Plan Note (Signed)
Trending up at 140 SBP but fatigue and sleepier feelings have resolved. Continue Amlodipine 5mg -valsartan 320 mg, hctz 12.5mg . At goal per [facility]. We discussed using goal 060 systolic as long as SE reasonable. Follow up 1 month with CPE or sooner if follows up for memory loss/cog impairment

## 2015-08-22 NOTE — Progress Notes (Signed)
Garret Reddish, MD  Subjective:  Wesley Fuller is a 74 y.o. year old very pleasant male patient who presents with:  Hypertension-controlled by JNC 8   BP Readings from Last 3 Encounters:  08/22/15 140/82  07/24/15 128/78  05/16/15 138/76   Home BP monitoring-most recently at home 140/75 but just got home from vacation and prior #s were much lower in 120s and 130s. Fatigue has improved. Felt sleepier on higher dose.  Compliant with medications-yes without side effects, hctz 12.5 mg, amlodipine-valsartan 5-320mg  ROS-Denies any CP, HA, SOB, blurry vision, LE edema, transient weakness, orthopnea, PND.   See problem oriented charting  Past Medical History-  Patient Active Problem List   Diagnosis Date Noted  . Essential hypertension 02/09/2008    Priority: Medium  . History of melanoma 02/09/2008    Priority: Medium  . Osteoarthritis of left knee 03/02/2014    Priority: Low  . VARICOSE VEINS LOWER EXTREMITIES W/INFLAMMATION 02/20/2010    Priority: Low  . GERD 11/21/2008    Priority: Low  . Actinic keratosis 10/12/2008    Priority: Low  . ERECTILE DYSFUNCTION 02/09/2008    Priority: Low  . RENAL CALCULUS, HX OF 02/09/2008    Priority: Low  . Mild cognitive impairment 08/22/2015     Medications- reviewed and updated Current Outpatient Prescriptions  Medication Sig Dispense Refill  . amLODipine-valsartan (EXFORGE) 5-320 MG per tablet Take 1 tablet by mouth daily. 30 tablet 0  . calcium-vitamin D (OSCAL WITH D) 500-200 MG-UNIT per tablet Take 2 tablets by mouth every morning.     . hydrochlorothiazide (HYDRODIURIL) 25 MG tablet Take 1 tablet (25 mg total) by mouth daily. 90 tablet 3  . Multiple Vitamins-Minerals (MULTIVITAMIN GUMMIES ADULT PO) Take 2 each by mouth every morning.    Marland Kitchen omeprazole (PRILOSEC) 40 MG capsule TAKE 1 CAPSULE BY MOUTH DAILY 30 MINTUES BEFORE AMEAL 90 capsule 5  . OVER THE COUNTER MEDICATION Take 2 tablets by mouth every morning. Focus Factor. Vitamin  A, C ,D,E, thiamine, niacin, b-6,b-12 and biotin    . polyvinyl alcohol (LIQUIFILM TEARS) 1.4 % ophthalmic solution Place 1-2 drops into both eyes daily as needed for dry eyes.    . Sodium Chloride-Sodium Bicarb (NETI POT SINUS WASH NA) Place 1 each into the nose daily.    . sildenafil (REVATIO) 20 MG tablet Take 20 mg by mouth daily as needed (takes 1-5 tablets for erectile dysfunction per urology).     Objective: BP 140/82 mmHg  Pulse 69  Temp(Src) 98.7 F (37.1 C)  Wt 194 lb (87.998 kg) Gen: NAD, resting comfortably CV: RRR no murmurs rubs or gallops Lungs: CTAB no crackles, wheeze, rhonchi Abdomen: soft/nontender/nondistended/normal bowel sounds. No rebound or guarding.  Ext: no edema Skin: warm, dry Neuro: grossly normal, moves all extremities  MMSE 27/30. Lost 3 for 3 word delayed recall.    Assessment/Plan:  Essential hypertension Trending up at 140 SBP but fatigue and sleepier feelings have resolved. Continue Amlodipine 5mg -valsartan 320 mg, hctz 12.5mg . At goal per [facility]. We discussed using goal 027 systolic as long as SE reasonable. Follow up 1 month with CPE or sooner if follows up for memory loss/cog impairment  Mild cognitive impairment S: Patient had expressed some concerns about memory in December. [facility]- he was going to call but has not. Mini cog at that time showed normal clock draw and had 2 word recall. Wife is very concerned about memory and patient agrees he thinks  there is a problem.  A/P: MMSE 27/30 with 0/3 delayed 3 word recall. Does not meet qualifications for dementia. Needs full neuro exam and PHQ9. Ordered future labs CBC, CMET, RPR, HIV, TSH, B12. Consider MRI    Fasting labs before physical Orders Placed This Encounter  Procedures  . Flu Vaccine QUAD 36+ mos IM  . CBC    [place]    Standing Status: Future     Number of Occurrences:      Standing Expiration Date: 08/21/2016  . Comprehensive metabolic panel    [place]     Standing Status: Future     Number of Occurrences:      Standing Expiration Date: 08/21/2016    Order Specific Question:  Has the patient fasted?    Answer:  No  . RPR    [facility]    Standing Status: Future     Number of Occurrences:      Standing Expiration Date: 08/21/2016  . HIV antibody    [facility]    Standing Status: Future     Number of Occurrences:      Standing Expiration Date: 08/21/2016  . TSH    [place]    Standing Status: Future     Number of Occurrences:      Standing Expiration Date: 08/21/2016  . Vitamin B12    Standing Status: Future     Number of Occurrences:      Standing Expiration Date: 08/21/2016  . Lipid panel    [place]    Standing Status: Future     Number of Occurrences:      Standing Expiration Date: 08/21/2016    Order Specific Question:  Has the patient fasted?    Answer:  No    Meds ordered this encounter  Medications  . amLODipine-valsartan (EXFORGE) 5-320 MG per tablet    Sig: Take 1 tablet by mouth daily.    Dispense:  30 tablet    Refill:  5

## 2015-08-22 NOTE — Assessment & Plan Note (Signed)
S: Patient had expressed some concerns about memory in December. He was [facility] in a [facility]- he was going to call but has not. Mini cog at that time showed normal clock draw and had 2 word recall. Wife is very concerned about memory and patient agrees he thinks there is a problem.  A/P: MMSE 27/30 with 0/3 delayed 3 word recall. Does not meet qualifications for dementia. Needs full neuro exam and PHQ9. Ordered future labs CBC, CMET, RPR, HIV, TSH, B12. Consider MRI

## 2015-09-21 ENCOUNTER — Other Ambulatory Visit (INDEPENDENT_AMBULATORY_CARE_PROVIDER_SITE_OTHER): Payer: Medicare HMO

## 2015-09-21 DIAGNOSIS — Z79899 Other long term (current) drug therapy: Secondary | ICD-10-CM | POA: Diagnosis not present

## 2015-09-21 DIAGNOSIS — G3184 Mild cognitive impairment, so stated: Secondary | ICD-10-CM

## 2015-09-21 DIAGNOSIS — N401 Enlarged prostate with lower urinary tract symptoms: Secondary | ICD-10-CM | POA: Diagnosis not present

## 2015-09-21 DIAGNOSIS — I10 Essential (primary) hypertension: Secondary | ICD-10-CM | POA: Diagnosis not present

## 2015-09-21 DIAGNOSIS — R351 Nocturia: Secondary | ICD-10-CM

## 2015-09-21 LAB — COMPREHENSIVE METABOLIC PANEL
ALK PHOS: 56 U/L (ref 39–117)
ALT: 21 U/L (ref 0–53)
AST: 20 U/L (ref 0–37)
Albumin: 3.7 g/dL (ref 3.5–5.2)
BUN: 19 mg/dL (ref 6–23)
CHLORIDE: 106 meq/L (ref 96–112)
CO2: 32 mEq/L (ref 19–32)
Calcium: 9.2 mg/dL (ref 8.4–10.5)
Creatinine, Ser: 0.92 mg/dL (ref 0.40–1.50)
GFR: 85.31 mL/min (ref >60.00)
Glucose, Bld: 107 mg/dL — ABNORMAL HIGH (ref 70–99)
POTASSIUM: 4.3 meq/L (ref 3.5–5.1)
SODIUM: 144 meq/L (ref 135–145)
Total Bilirubin: 1.2 mg/dL (ref 0.2–1.2)
Total Protein: 5.9 g/dL — ABNORMAL LOW (ref 6.0–8.3)

## 2015-09-21 LAB — LIPID PANEL
Cholesterol: 152 mg/dL (ref 0–200)
HDL: 56.8 mg/dL (ref >39.00)
LDL CALC: 82 mg/dL (ref 0–99)
NonHDL: 95.5
TRIGLYCERIDES: 66 mg/dL (ref 0.0–149.0)
Total CHOL/HDL Ratio: 3
VLDL: 13.2 mg/dL (ref 0.0–40.0)

## 2015-09-21 LAB — CBC
HCT: 45.8 % (ref 39.0–52.0)
HEMOGLOBIN: 15.5 g/dL (ref 13.0–17.0)
MCHC: 33.8 g/dL (ref 30.0–36.0)
MCV: 96.9 fl (ref 78.0–100.0)
Platelets: 164 10*3/uL (ref 150.0–400.0)
RBC: 4.72 Mil/uL (ref 4.22–5.81)
RDW: 13.4 % (ref 11.5–15.5)
WBC: 7.3 10*3/uL (ref 4.0–10.5)

## 2015-09-21 LAB — PSA: PSA: 1.84 ng/mL (ref 0.10–4.00)

## 2015-09-21 LAB — VITAMIN B12: Vitamin B-12: 537 pg/mL (ref 211–911)

## 2015-09-21 LAB — TSH: TSH: 1.6 u[IU]/mL (ref 0.35–4.50)

## 2015-09-22 LAB — HIV ANTIBODY (ROUTINE TESTING W REFLEX): HIV: NONREACTIVE

## 2015-09-22 LAB — RPR

## 2015-09-25 ENCOUNTER — Encounter: Payer: Self-pay | Admitting: Family Medicine

## 2015-09-28 ENCOUNTER — Encounter: Payer: Medicare HMO | Admitting: Family Medicine

## 2015-10-04 ENCOUNTER — Encounter: Payer: Medicare HMO | Admitting: Family Medicine

## 2015-10-11 ENCOUNTER — Encounter: Payer: Self-pay | Admitting: Family Medicine

## 2015-10-11 ENCOUNTER — Ambulatory Visit (INDEPENDENT_AMBULATORY_CARE_PROVIDER_SITE_OTHER): Payer: Medicare HMO | Admitting: Family Medicine

## 2015-10-11 VITALS — BP 148/82 | HR 71 | Temp 98.3°F | Ht 72.0 in | Wt 193.0 lb

## 2015-10-11 DIAGNOSIS — G3184 Mild cognitive impairment, so stated: Secondary | ICD-10-CM | POA: Diagnosis not present

## 2015-10-11 DIAGNOSIS — I10 Essential (primary) hypertension: Secondary | ICD-10-CM | POA: Diagnosis not present

## 2015-10-11 MED ORDER — HYDROCHLOROTHIAZIDE 25 MG PO TABS: 25.0000 mg | ORAL_TABLET | Freq: Every day | ORAL | Status: DC

## 2015-10-11 MED ORDER — AMLODIPINE BESYLATE-VALSARTAN 5-320 MG PO TABS: 1.0000 | ORAL_TABLET | Freq: Every day | ORAL | Status: DC

## 2015-10-11 NOTE — Assessment & Plan Note (Signed)
S: mild poor control on Amlodipine 5mg -valsartan 320 mg, hctz 12.5mg  A/P: orthostatic symptoms with tighter control. No clear indication for Goal 140/90 though I would prefer closer to this. Fortunately home readings since 8/13 were 80% under 140 and usually just at 140 if above at. Patient also under great deal of stress with topic of discussion today. We will continue to monitor as long as BP <150/90

## 2015-10-11 NOTE — Patient Instructions (Signed)
We will call you within a week about your referral to neurology to evaluate memory loss likely mild cognitive impairment. If you do not hear within 2 weeks, give [place] a call.   Schedule next available physical  Blood pressure up a bit here but has been controlled at home

## 2015-10-11 NOTE — Assessment & Plan Note (Signed)
S: in December of last year, patient and wife expressed some concern about some memory loss. At that time, clock draw as normal and patient had 2/3 word recall. When I saw patient a few weeks ago, we completed an MMSE which showed 27/30 with 0/3 word recall. His labs did not show a reversible cause of dementia. PHQ9 today was scored at 2 with some issues with sleep and energy but phq2 with no anhedonia or depressed feelings.  O:Neuro: CN II-XII intact, sensation and reflexes normal throughout, 5/5 muscle strength in bilateral upper and lower extremities. Normal finger to nose. Normal rapid alternating movements. Normal Gait. No pronator drift.   A/P: cannot diagnose with dementia at this time, though label mild cognitive impairment with memory loss We discussed 3 options: 1. Monitor with repeat MMSE in 6 months. 2. Start aricept and follow up in 6 months though benefit for MCI not as clear as with alzheimers 3. Refer to neurology. Patient and wife would like to refer to neurology. We initially planned to order MRI before visit but this will be decided when patient sees neurology. Discussed may take 2 months to get patient in and family aware. They are also going to reach out to [facility] about a potential study to see if patient qualifies.

## 2015-10-11 NOTE — Progress Notes (Signed)
Wesley Reddish, MD  Subjective:  Wesley Fuller is a 74 y.o. year old very pleasant male patient who presents for/with See problem oriented charting ROS- no headache or blurry vision, no chest pain or shortness of breath  Past Medical History-  Patient Active Problem List   Diagnosis Date Noted  . Essential hypertension 02/09/2008    Priority: Medium  . History of melanoma 02/09/2008    Priority: Medium  . Osteoarthritis of left knee 03/02/2014    Priority: Low  . VARICOSE VEINS LOWER EXTREMITIES W/INFLAMMATION 02/20/2010    Priority: Low  . GERD 11/21/2008    Priority: Low  . Actinic keratosis 10/12/2008    Priority: Low  . ERECTILE DYSFUNCTION 02/09/2008    Priority: Low  . RENAL CALCULUS, HX OF 02/09/2008    Priority: Low  . Mild cognitive impairment 08/22/2015    Medications- reviewed and updated Current Outpatient Prescriptions  Medication Sig Dispense Refill  . amLODipine-valsartan (EXFORGE) 5-320 MG tablet Take 1 tablet by mouth daily. 90 tablet 3  . calcium-vitamin D (OSCAL WITH D) 500-200 MG-UNIT per tablet Take 2 tablets by mouth every morning.     . hydrochlorothiazide (HYDRODIURIL) 25 MG tablet Take 1 tablet (25 mg total) by mouth daily. 90 tablet 3  . Multiple Vitamins-Minerals (MULTIVITAMIN GUMMIES ADULT PO) Take 2 each by mouth every morning.    Marland Kitchen omeprazole (PRILOSEC) 40 MG capsule TAKE 1 CAPSULE BY MOUTH DAILY 30 MINTUES BEFORE AMEAL 90 capsule 5  . OVER THE COUNTER MEDICATION Take 2 tablets by mouth every morning. Focus Factor. Vitamin A, C ,D,E, thiamine, niacin, b-6,b-12 and biotin    . polyvinyl alcohol (LIQUIFILM TEARS) 1.4 % ophthalmic solution Place 1-2 drops into both eyes daily as needed for dry eyes.    . sildenafil (REVATIO) 20 MG tablet Take 20 mg by mouth daily as needed (takes 1-5 tablets for erectile dysfunction per urology).    . Sodium Chloride-Sodium Bicarb (NETI POT SINUS WASH NA) Place 1 each into the nose daily.     No current  facility-administered medications for this visit.    Objective: BP 148/82 mmHg  Pulse 71  Temp(Src) 98.3 F (36.8 C)  Ht 6' (1.829 m)  Wt 193 lb (87.544 kg)  BMI 26.17 kg/m2 Gen: NAD, resting comfortably CV: RRR no murmurs rubs or gallops Lungs: CTAB no crackles, wheeze, rhonchi Abdomen: soft/nontender/nondistended/normal bowel sounds. No rebound or guarding.  Ext: no edema Skin: warm, dry Neuro: see below  Assessment/Plan:  Mild cognitive impairment S: in December of last year, patient and wife expressed some concern about some memory loss. At that time, clock draw as normal and patient had 2/3 word recall. When I saw patient a few weeks ago, we completed an MMSE which showed 27/30 with 0/3 word recall. His labs did not show a reversible cause of dementia. PHQ9 today was scored at 2 with some issues with sleep and energy but phq2 with no anhedonia or depressed feelings.  O:Neuro: CN II-XII intact, sensation and reflexes normal throughout, 5/5 muscle strength in bilateral upper and lower extremities. Normal finger to nose. Normal rapid alternating movements. Normal Gait. No pronator drift.   A/P: cannot diagnose with dementia at this time, though label mild cognitive impairment with memory loss We discussed 3 options: 1. Monitor with repeat MMSE in 6 months. 2. Start aricept and follow up in 6 months though benefit for MCI not as clear as with alzheimers 3. Refer to neurology. Patient and wife would like to  refer to neurology. We initially planned to order MRI before visit but this will be decided when patient sees neurology. Discussed may take 2 months to get patient in and family aware. They are also going to reach out to [facility] about a potential study to see if patient qualifies.    Essential hypertension S: mild poor control on Amlodipine 5mg -valsartan 320 mg, hctz 12.5mg  A/P: orthostatic symptoms with tighter control. No clear indication for Goal 140/90 though I would prefer  closer to this. Fortunately home readings since 8/13 were 80% under 140 and usually just at 140 if above at. Patient also under great deal of stress with topic of discussion today. We will continue to monitor as long as BP <150/90   Orders Placed This Encounter  Procedures  . Ambulatory referral to Neurology    Referral Priority:  Routine    Referral Type:  Consultation    Referral Reason:  Specialty Services Required    Requested Specialty:  Neurology    Number of Visits Requested:  1    Meds ordered this encounter  Medications  . amLODipine-valsartan (EXFORGE) 5-320 MG tablet    Sig: Take 1 tablet by mouth daily.    Dispense:  90 tablet    Refill:  3  . hydrochlorothiazide (HYDRODIURIL) 25 MG tablet    Sig: Take 1 tablet (25 mg total) by mouth daily.    Dispense:  90 tablet    Refill:  3  actually taking 12.5mg  HCTZ  >50% of 30 minute office visit was spent on counseling (about memory loss and dementia and decisions on next steps) and coordination of care

## 2015-11-29 ENCOUNTER — Ambulatory Visit (INDEPENDENT_AMBULATORY_CARE_PROVIDER_SITE_OTHER): Payer: Medicare HMO | Admitting: Neurology

## 2015-11-29 ENCOUNTER — Encounter: Payer: Self-pay | Admitting: Neurology

## 2015-11-29 VITALS — BP 132/80 | HR 77 | Resp 16 | Wt 194.0 lb

## 2015-11-29 DIAGNOSIS — I10 Essential (primary) hypertension: Secondary | ICD-10-CM | POA: Diagnosis not present

## 2015-11-29 DIAGNOSIS — G3184 Mild cognitive impairment, so stated: Secondary | ICD-10-CM | POA: Diagnosis not present

## 2015-11-29 NOTE — Patient Instructions (Signed)
1. Schedule MRI brain without contrast 2. Control of blood pressure, cholesterol, as well as physical exercise and brain stimulation exercises are important for brain health 3. Please update [place] on [facility] research study 4. Follow-up in 6 months, call for any changes

## 2015-11-29 NOTE — Progress Notes (Signed)
NEUROLOGY CONSULTATION NOTE  Wesley Fuller MRN: [id number] DOB: 01-21-1941  Referring provider: Dr. Garret Fuller Primary care provider: Dr. Garret Fuller  Reason for consult:  Memory loss  Dear Dr Wesley Fuller:  Thank you for your kind referral of Wesley Fuller for consultation of the above symptoms. Although his history is well known to you, please allow me to reiterate it for the purpose of our medical record. The patient was accompanied to the clinic by his wife who also provides collateral information. Records and images were personally reviewed where available.  HISTORY OF PRESENT ILLNESS: This is a very pleasant 74 year old right-handed man with a history of hypertension, presenting for evaluation of worsening memory. He started noticing memory changes over the past 9-12 months, however his wife reports noticing changes around 2-1/2 years ago. He mostly notices difficulties with name and word retrieval, as well as misplacing things at home. His wife reports that they were playing bridge 2-1/2 years ago and he got confused with what cards to play. He does not recall this incident. Another time they were visiting their son in [place] and kept changing locations, but he would be confused that whole week as to what day it was or could not recall where they should be. Last September, he was driving out of state for a golf game that he attends once a year, but became so anxious about his drive and uncertain despite being given directions. His wife states it is out of character for him to be anxious. His wife reports that despite writing things down, he would get confused if he said yes to a commitment. He is more unsure of himself on the computer and cellphone, asking his wife to check if an email he wrote looked okay. He has occasional word-finding difficulties and has always had problems multitasking. He denies getting lost driving, no missed medications or bill payments, although his wife pays  majority of bills. He reports an odd instance 2-1/2 weeks ago while out of town, he went to bed early and thought he was dreaming that he was in his house but it looked different, he got up and asked people what was going on, thinking he was still dreaming, but was later on told by friends that he did come and ask them this. He was more tired that day from driving long distance. His wife reports that she notices more confusion and memory issues when he is tired. MMSE at PCP office last 08/22/15 was 27/30. He was offered Aricept but did not start this, instead they have been to [facility] for consideration of joining a research study. He underwent neurocognitive testing but they have not heard results yet.  He denies any headaches, dizziness, diplopia, dysarthria, dysphagia, neck/back pain, focal numbness/tingling/weakness, tremors, no falls. He has a change in stool consistency. He reports chronic poor sense of smell. No REM behavior disorder. He denies any significant head injuries. His brother has Parkinson's dementia and had a good response to Aricept. He drinks one glass of wine a night, 1-2 drinks occasionally on weekends.  Laboratory Data: Lab Results  Component Value Date   WBC 7.3 09/21/2015   HGB 15.5 09/21/2015   HCT 45.8 09/21/2015   MCV 96.9 09/21/2015   PLT 164.0 09/21/2015     Chemistry      Component Value Date/Time   NA 144 09/21/2015 0807   K 4.3 09/21/2015 0807   CL 106 09/21/2015 0807   CO2 32 09/21/2015 0807  BUN 19 09/21/2015 0807   CREATININE 0.92 09/21/2015 0807      Component Value Date/Time   CALCIUM 9.2 09/21/2015 0807   ALKPHOS 56 09/21/2015 0807   AST 20 09/21/2015 0807   ALT 21 09/21/2015 0807   BILITOT 1.2 09/21/2015 0807     Lab Results  Component Value Date   TSH 1.60 09/21/2015   Lab Results  Component Value Date   [id number] 09/21/2015     PAST MEDICAL HISTORY: Past Medical History  Diagnosis Date  . Hypertension   . ED (erectile  dysfunction)   . Calcium oxalate renal stones   . Diverticulosis   . Nail fungal infection   . Cataract 2013  . Allergy     seasonal  . Melanoma of back (Wesley Fuller) 1974  . GERD (gastroesophageal reflux disease)   . Osteoarthritis of left knee 03/02/2014  . Asthma     as a child    PAST SURGICAL HISTORY: Past Surgical History  Procedure Laterality Date  . Skin cancer    . Tonsillectomy  1974  . Lymph node disection  1981  . Melanoma removal of back   1974  . Left leg stripping    . Hernia repair      x 2  . Nasal polyp surgery      MEDICATIONS: Current Outpatient Prescriptions on File Prior to Visit  Medication Sig Dispense Refill  . amLODipine-valsartan (EXFORGE) 5-320 MG tablet Take 1 tablet by mouth daily. 90 tablet 3  . calcium-vitamin D (OSCAL WITH D) 500-200 MG-UNIT per tablet Take 2 tablets by mouth every morning.     . hydrochlorothiazide (HYDRODIURIL) 25 MG tablet Take 1 tablet (25 mg total) by mouth daily. 90 tablet 3  . Multiple Vitamins-Minerals (MULTIVITAMIN GUMMIES ADULT PO) Take 2 each by mouth every morning.     No current facility-administered medications on file prior to visit.    ALLERGIES: No Known Allergies  FAMILY HISTORY: Family History  Problem Relation Age of Onset  . Colon cancer Father   . Colon cancer Paternal Uncle   . Hypertension    . Colon cancer Paternal Uncle     SOCIAL HISTORY: Social History   Social History  . Marital Status: Married    Spouse Name: N/A  . Number of Children: 2  . Years of Education: N/A   Occupational History  . Retired    Social History Main Topics  . Smoking status: Never Smoker   . Smokeless tobacco: Never Used  . Alcohol Use: 3.0 oz/week    5 Glasses of wine per week     Comment: daily  . Drug Use: No  . Sexual Activity: Yes   Other Topics Concern  . Not on file   Social History Narrative   Married ([name]) 76. 2 sons- 1 in [place]. 3 grandsons.       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[employer], [employer]: golf, travel    REVIEW OF SYSTEMS: Constitutional: No fevers, chills, or sweats, no generalized fatigue, change in appetite Eyes: No visual changes, double vision, eye pain Ear, nose and throat: No hearing loss, ear pain, nasal congestion, sore throat Cardiovascular: No chest pain, palpitations Respiratory:  No shortness of breath at rest or with exertion, wheezes GastrointestinaI: No nausea, vomiting, diarrhea, abdominal pain, fecal incontinence Genitourinary:  No dysuria, urinary retention or frequency Musculoskeletal:  No neck pain, back pain Integumentary: No rash, pruritus,  skin lesions Neurological: as above Psychiatric: No depression, insomnia, anxiety Endocrine: No palpitations, fatigue, diaphoresis, mood swings, change in appetite, change in weight, increased thirst Hematologic/Lymphatic:  No anemia, purpura, petechiae. Allergic/Immunologic: no itchy/runny eyes, nasal congestion, recent allergic reactions, rashes  PHYSICAL EXAM: Filed Vitals:   11/29/15 0847  BP: 132/80  Pulse: 77  Resp: 16   General: No acute distress Head:  Normocephalic/atraumatic Eyes: Fundoscopic exam shows bilateral sharp discs, no vessel changes, exudates, or hemorrhages Neck: supple, no paraspinal tenderness, full range of motion Back: No paraspinal tenderness Heart: regular rate and rhythm Lungs: Clear to auscultation bilaterally. Vascular: No carotid bruits. Skin/Extremities: No rash, no edema Neurological Exam: Mental status: alert and oriented to person, place, and time, no dysarthria or aphasia, Fund of knowledge is appropriate.  Recent and remote memory are intact.  Attention and concentration are normal.    Able to name objects and repeat phrases.  Montreal Cognitive Assessment  11/29/2015  Visuospatial/ Executive (0/5) 5  Naming (0/3) 3  Attention: Read list of digits (0/2) 2  Attention:  Read list of letters (0/1) 1  Attention: Serial 7 subtraction starting at 100 (0/3) 3  Language: Repeat phrase (0/2) 2  Language : Fluency (0/1) 1  Abstraction (0/2) 2  Delayed Recall (0/5) 2  Orientation (0/6) 6  Total 27  Adjusted Score (based on education) 27   Cranial nerves: CN I: not tested CN II: pupils equal, round and reactive to light, visual fields intact, fundi unremarkable. CN III, IV, VI:  full range of motion, no nystagmus, no ptosis CN V: facial sensation intact CN VII: upper and lower face symmetric CN VIII: hearing intact to finger rub CN IX, X: gag intact, uvula midline CN XI: sternocleidomastoid and trapezius muscles intact CN XII: tongue midline Bulk & Tone: normal, no fasciculations. Motor: 5/5 throughout with no pronator drift. Sensation: intact to light touch, cold, pin, vibration and joint position sense.  No extinction to double simultaneous stimulation.  Romberg test negative Deep Tendon Reflexes: +1 throughout, no ankle clonus Plantar responses: downgoing bilaterally Cerebellar: no incoordination on finger to nose, heel to shin. No dysdiadochokinesia Gait: narrow-based and steady, able to tandem walk adequately. Tremor: none  IMPRESSION: This is a very pleasant 74 year old right-handed man with hypertension, presenting for worsening memory loss. His neurological exam is normal, MOCA score today normal 27/30. By history, symptoms suggestive of mild cognitive impairment. We discussed different causes of memory loss. TSH and B12 normal. MRI brain without contrast will be ordered to assess for underlying structural abnormality and assess vascular load. We discussed expectations and indications for Aricept, it would be off-label to use for MCI, but there have been reports that it may be beneficial starting at low dose. We agreed on waiting for [facility] response first regarding research trial participation, then potentially starting low dose Aricept. We  discussed the importance of control of vascular risk factors, physical exercise, and brain stimulation exercises for brain health. He will follow-up in 6 months and knows to call for any changes.   Thank you for allowing me to participate in the care of this patient. Please do not hesitate to call for any questions or concerns.   Ellouise Newer, M.D.  CC: Dr. Yong Fuller

## 2015-12-11 ENCOUNTER — Inpatient Hospital Stay (HOSPITAL_COMMUNITY): Admission: RE | Admit: 2015-12-11 | Payer: Medicare HMO | Source: Ambulatory Visit

## 2015-12-26 ENCOUNTER — Encounter: Payer: Self-pay | Admitting: Neurology

## 2016-01-02 ENCOUNTER — Other Ambulatory Visit (HOSPITAL_COMMUNITY): Payer: Medicare HMO

## 2016-01-10 DIAGNOSIS — M1712 Unilateral primary osteoarthritis, left knee: Secondary | ICD-10-CM | POA: Diagnosis not present

## 2016-01-23 DIAGNOSIS — N200 Calculus of kidney: Secondary | ICD-10-CM | POA: Diagnosis not present

## 2016-01-30 DIAGNOSIS — M1712 Unilateral primary osteoarthritis, left knee: Secondary | ICD-10-CM | POA: Diagnosis not present

## 2016-02-06 DIAGNOSIS — M1712 Unilateral primary osteoarthritis, left knee: Secondary | ICD-10-CM | POA: Diagnosis not present

## 2016-02-09 ENCOUNTER — Encounter: Payer: Self-pay | Admitting: Neurology

## 2016-02-13 DIAGNOSIS — M1712 Unilateral primary osteoarthritis, left knee: Secondary | ICD-10-CM | POA: Diagnosis not present

## 2016-03-12 DIAGNOSIS — N200 Calculus of kidney: Secondary | ICD-10-CM | POA: Diagnosis not present

## 2016-03-14 ENCOUNTER — Ambulatory Visit (INDEPENDENT_AMBULATORY_CARE_PROVIDER_SITE_OTHER): Payer: PPO | Admitting: Family Medicine

## 2016-03-14 ENCOUNTER — Encounter: Payer: Self-pay | Admitting: Family Medicine

## 2016-03-14 VITALS — BP 148/76 | HR 79 | Temp 98.3°F | Resp 20 | Ht 70.0 in | Wt 198.0 lb

## 2016-03-14 DIAGNOSIS — Z0000 Encounter for general adult medical examination without abnormal findings: Secondary | ICD-10-CM | POA: Diagnosis not present

## 2016-03-14 DIAGNOSIS — I10 Essential (primary) hypertension: Secondary | ICD-10-CM | POA: Diagnosis not present

## 2016-03-14 DIAGNOSIS — G3184 Mild cognitive impairment, so stated: Secondary | ICD-10-CM

## 2016-03-14 NOTE — Assessment & Plan Note (Signed)
S: Starting 2016 likely- Cared for by [facility]- clinical trials (diagnosed with disease based off imaging but not true dementia yet). Still labeled as MCI. Appears to be in study on monoclonal antibody- not sure obviously if he will be in treatment or placebo group.  A/P: I discussed with him consideration of aricept with last MMSE 27/30. He wants to make this decision with [facility]- I am not sure if they would even start this medicine. He is going to check with his wife and [facility]. If they will not start- he actually sees neurology before he sees me back so could discuss at that visit.

## 2016-03-14 NOTE — Patient Instructions (Addendum)
  Wesley Fuller , Thank you for taking time to come for your Medicare Wellness Visit. I appreciate your ongoing commitment to your health goals. Please review the following plan we discussed and let me know if I can assist you in the future.   These are the goals we discussed: 1. Ask Dr. Alinda Money if we should stop doing PSAs at this point (in my opinion we should stop) 2. Check with [name] and [facility] about whether your primary care doctor or neurologist can prescribe aricept.  3. Restart checking your blood pressure at home. Goal <140/90. You have been under this in the past at home 4. Follow up in 58months- we can discuss aricept at that time and do another MMSE test if [facility] is ok with [place] starting the medicine- you could also return to Dr. [name] to have this conversation   This is a list of the screening recommended for you and due dates:  Health Maintenance  Topic Date Due  . Tetanus Vaccine  01/29/2016  . Flu Shot  07/23/2016  . Colon Cancer Screening  02/10/2019  . Shingles Vaccine  Completed  . Pneumonia vaccines  Completed

## 2016-03-14 NOTE — Assessment & Plan Note (Signed)
S: poorly controlled. On  Amlodipine 5mg -valsartan 320mg  and hctz 37.5 mg (recent addition of 12.5mg  in afternoon in addition to prior 25mg  in morning BP Readings from Last 3 Encounters:  03/14/16 148/76  11/29/15 132/80  10/11/15 148/82  A/P:Continue current meds:  At goal per JNC 8. Home monitoring has shown even better #s in past- will restart this home monitoring and follow up in october

## 2016-03-14 NOTE — Progress Notes (Signed)
Wesley Reddish, MD Phone: [phone]  Subjective:  Patient presents today for their annual wellness visit.    Preventive Screening-Counseling & Management  Smoking Status: Never Smoker Second Hand Smoking status: No smokers in home  Risk Factors Regular exercise: 3-4x a week at [facility] Diet: reasonable  Fall Risk: None   Cardiac risk factors:  advanced age (older than 63 for men, 8 for women)  Hyperlipidemia - none Hypertension- on treatment No diabetes.    Depression Screen None. PHQ2 0   Activities of Daily Living Independent ADLs and IADLs   Hearing Difficulties: -patient wears hearing aids- goes to audiology regularly  Cognitive Testing None needed- under care of [facility] for new onset dementia  Advanced Directives Has HCPOA- wife- also living will through [employer] and [employer] the Names of Other Physician/Practitioners you currently use: -Dr. Alinda Money Urology (kidney stones) -Optho- does not remember name today  Immunization History  Administered Date(s) Administered  . Hepatitis A 08/11/2007  . Influenza Split 09/08/2012  . Influenza Whole 09/26/2008, 10/04/2009, 09/04/2010, 09/03/2011  . Influenza, High Dose Seasonal PF 10/06/2013, 11/08/2014  . Influenza,inj,Quad PF,36+ Mos 08/22/2015  . Pneumococcal Conjugate-13 12/08/2014  . Pneumococcal Polysaccharide-23 01/28/2006  . Td 01/28/2006  . Zoster 12/08/2014   Required Immunizations needed today: Tdap but not covered  Screening tests- up to date  ROS- No pertinent positives discovered in course of AWV Problem oriented ROS- No chest pain or shortness of breath. No headache or blurry vision.   The following were reviewed and entered/updated in epic: Past Medical History  Diagnosis Date  . Hypertension   . ED (erectile dysfunction)   . Calcium oxalate renal stones   . Diverticulosis   . Nail fungal infection   . Cataract 2013  . Allergy     seasonal    . Melanoma of back ([place]) 1974  . GERD (gastroesophageal reflux disease)   . Osteoarthritis of left knee 03/02/2014  . Asthma     as a child   Patient Active Problem List   Diagnosis Date Noted  . MCI (mild cognitive impairment) 08/22/2015    Priority: High  . Essential hypertension 02/09/2008    Priority: Medium  . History of melanoma 02/09/2008    Priority: Medium  . Osteoarthritis of left knee 03/02/2014    Priority: Low  . VARICOSE VEINS LOWER EXTREMITIES W/INFLAMMATION 02/20/2010    Priority: Low  . GERD 11/21/2008    Priority: Low  . Actinic keratosis 10/12/2008    Priority: Low  . ERECTILE DYSFUNCTION 02/09/2008    Priority: Low  . RENAL CALCULUS, HX OF 02/09/2008    Priority: Low   Past Surgical History  Procedure Laterality Date  . Skin cancer    . Tonsillectomy  1974  . Lymph node disection  1981  . Melanoma removal of back   1974  . Left leg stripping    . Hernia repair      x 2  . Nasal polyp surgery      Family History  Problem Relation Age of Onset  . Colon cancer Father   . Colon cancer Paternal Uncle   . Hypertension    . Colon cancer Paternal Uncle     Medications- reviewed and updated Current Outpatient Prescriptions  Medication Sig Dispense Refill  . amLODipine-valsartan (EXFORGE) 5-320 MG tablet Take 1 tablet by mouth daily. 90 tablet 3  . aspirin 81 MG tablet Take 81 mg by mouth daily.    . calcium-vitamin D (  OSCAL WITH D) 500-200 MG-UNIT per tablet Take 2 tablets by mouth every morning.     Marland Kitchen CINNAMON PO Take by mouth.    . hydrochlorothiazide (HYDRODIURIL) 25 MG tablet Take 1 tablet (25 mg total) by mouth daily. 90 tablet 3  . Multiple Vitamins-Minerals (MULTIVITAMIN GUMMIES ADULT PO) Take 2 each by mouth every morning.     No current facility-administered medications for this visit.    Allergies-reviewed and updated No Known Allergies  Social History   Social History  . Marital Status: Married    Spouse Name: N/A  . Number of  Children: 2  . Years of Education: N/A   Occupational History  . Retired    Social History Main Topics  . Smoking status: Never Smoker   . Smokeless tobacco: Never Used  . Alcohol Use: 3.0 oz/week    5 Glasses of wine per week     Comment: daily  . Drug Use: No  . Sexual Activity: Yes   Other Topics Concern  . None   Social History Narrative   Married (wife Wesley Fuller) 1965. 2 sons- 1 in [place]. 3 grandsons.       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 Oropharynx normal Neck: no thyromegaly CV: RRR no murmurs rubs or gallops Lungs: CTAB no crackles, wheeze, rhonchi Abdomen: soft/nontender/nondistended/normal bowel sounds. No rebound or guarding.  Ext: no edema Skin: warm, dry Neuro: grossly normal, moves all extremities, PERRLA  Assessment/Plan:  AWV completed- discussed recommended screenings anddocumented any personalized health advice and referrals for preventive counseling. See AVS as well which was given to patient.      Essential hypertension S: poorly controlled. On  Amlodipine 5mg -valsartan 320mg  and hctz 37.5 mg (recent addition of 12.5mg  in afternoon in addition to prior 25mg  in morning BP Readings from Last 3 Encounters:  03/14/16 148/76  11/29/15 132/80  10/11/15 148/82  A/P:Continue current meds:  At goal per JNC 8. Home monitoring has shown even better #s in past- will restart this home monitoring and follow up in october  MCI (mild cognitive impairment) S: Starting 2016 likely- Cared [facility] by [facility]- clinical trials (diagnosed with disease based off imaging but not true dementia yet). Still labeled as MCI. Appears to be in study on  monoclonal antibody- not sure obviously if he will be in treatment or placebo group.  A/P: I discussed with him consideration of aricept with last MMSE 27/30. He wants to make this decision with [facility]- I am not sure if they would even start this medicine. He is going to check with his wife and [facility]. If they will not start- he actually sees neurology before he sees me back so could discuss at that visit.     October follow up. Return precautions advised.

## 2016-04-02 DIAGNOSIS — H9041 Sensorineural hearing loss, unilateral, right ear, with unrestricted hearing on the contralateral side: Secondary | ICD-10-CM | POA: Diagnosis not present

## 2016-04-11 DIAGNOSIS — H6121 Impacted cerumen, right ear: Secondary | ICD-10-CM | POA: Diagnosis not present

## 2016-04-11 DIAGNOSIS — H903 Sensorineural hearing loss, bilateral: Secondary | ICD-10-CM | POA: Diagnosis not present

## 2016-04-11 DIAGNOSIS — Z974 Presence of external hearing-aid: Secondary | ICD-10-CM | POA: Diagnosis not present

## 2016-04-11 DIAGNOSIS — H905 Unspecified sensorineural hearing loss: Secondary | ICD-10-CM | POA: Insufficient documentation

## 2016-05-23 ENCOUNTER — Encounter: Payer: Self-pay | Admitting: Family Medicine

## 2016-06-11 ENCOUNTER — Ambulatory Visit: Payer: Medicare HMO | Admitting: Neurology

## 2016-07-09 DIAGNOSIS — Z85820 Personal history of malignant melanoma of skin: Secondary | ICD-10-CM | POA: Diagnosis not present

## 2016-07-09 DIAGNOSIS — L820 Inflamed seborrheic keratosis: Secondary | ICD-10-CM | POA: Diagnosis not present

## 2016-07-09 DIAGNOSIS — D225 Melanocytic nevi of trunk: Secondary | ICD-10-CM | POA: Diagnosis not present

## 2016-07-09 DIAGNOSIS — D485 Neoplasm of uncertain behavior of skin: Secondary | ICD-10-CM | POA: Diagnosis not present

## 2016-07-09 DIAGNOSIS — Z08 Encounter for follow-up examination after completed treatment for malignant neoplasm: Secondary | ICD-10-CM | POA: Diagnosis not present

## 2016-07-09 DIAGNOSIS — Z1283 Encounter for screening for malignant neoplasm of skin: Secondary | ICD-10-CM | POA: Diagnosis not present

## 2016-07-09 DIAGNOSIS — L570 Actinic keratosis: Secondary | ICD-10-CM | POA: Diagnosis not present

## 2016-07-09 DIAGNOSIS — X32XXXD Exposure to sunlight, subsequent encounter: Secondary | ICD-10-CM | POA: Diagnosis not present

## 2016-07-17 DIAGNOSIS — D485 Neoplasm of uncertain behavior of skin: Secondary | ICD-10-CM | POA: Diagnosis not present

## 2016-07-17 DIAGNOSIS — L988 Other specified disorders of the skin and subcutaneous tissue: Secondary | ICD-10-CM | POA: Diagnosis not present

## 2016-07-30 DIAGNOSIS — H524 Presbyopia: Secondary | ICD-10-CM | POA: Diagnosis not present

## 2016-07-30 DIAGNOSIS — Z961 Presence of intraocular lens: Secondary | ICD-10-CM | POA: Diagnosis not present

## 2016-07-30 DIAGNOSIS — H40013 Open angle with borderline findings, low risk, bilateral: Secondary | ICD-10-CM | POA: Diagnosis not present

## 2016-08-12 DIAGNOSIS — N200 Calculus of kidney: Secondary | ICD-10-CM | POA: Diagnosis not present

## 2016-09-11 DIAGNOSIS — M1712 Unilateral primary osteoarthritis, left knee: Secondary | ICD-10-CM | POA: Diagnosis not present

## 2016-09-18 DIAGNOSIS — M1712 Unilateral primary osteoarthritis, left knee: Secondary | ICD-10-CM | POA: Diagnosis not present

## 2016-09-25 DIAGNOSIS — M1712 Unilateral primary osteoarthritis, left knee: Secondary | ICD-10-CM | POA: Diagnosis not present

## 2016-10-07 ENCOUNTER — Other Ambulatory Visit (INDEPENDENT_AMBULATORY_CARE_PROVIDER_SITE_OTHER): Payer: PPO

## 2016-10-07 DIAGNOSIS — Z0000 Encounter for general adult medical examination without abnormal findings: Secondary | ICD-10-CM | POA: Diagnosis not present

## 2016-10-07 LAB — BASIC METABOLIC PANEL
BUN: 23 mg/dL (ref 6–23)
CALCIUM: 9.5 mg/dL (ref 8.4–10.5)
CO2: 30 mEq/L (ref 19–32)
Chloride: 104 mEq/L (ref 96–112)
Creatinine, Ser: 0.99 mg/dL (ref 0.40–1.50)
GFR: 78.17 mL/min (ref >60.00)
Glucose, Bld: 116 mg/dL — ABNORMAL HIGH (ref 70–99)
POTASSIUM: 3.9 meq/L (ref 3.5–5.1)
SODIUM: 142 meq/L (ref 135–145)

## 2016-10-07 LAB — POC URINALSYSI DIPSTICK (AUTOMATED)
Bilirubin, UA: NEGATIVE
Glucose, UA: NEGATIVE
Ketones, UA: NEGATIVE
Leukocytes, UA: NEGATIVE
NITRITE UA: NEGATIVE
PH UA: 6
PROTEIN UA: NEGATIVE
RBC UA: NEGATIVE
SPEC GRAV UA: 1.02
UROBILINOGEN UA: 0.2

## 2016-10-07 LAB — HEPATIC FUNCTION PANEL
ALBUMIN: 4.3 g/dL (ref 3.5–5.2)
ALK PHOS: 57 U/L (ref 39–117)
ALT: 19 U/L (ref 0–53)
AST: 18 U/L (ref 0–37)
BILIRUBIN DIRECT: 0.2 mg/dL (ref 0.0–0.3)
TOTAL PROTEIN: 6.6 g/dL (ref 6.0–8.3)
Total Bilirubin: 1.2 mg/dL (ref 0.2–1.2)

## 2016-10-07 LAB — CBC WITH DIFFERENTIAL/PLATELET
BASOS ABS: 0 10*3/uL (ref 0.0–0.1)
BASOS PCT: 0.5 % (ref 0.0–3.0)
EOS ABS: 0.1 10*3/uL (ref 0.0–0.7)
Eosinophils Relative: 1.4 % (ref 0.0–5.0)
HEMATOCRIT: 46.5 % (ref 39.0–52.0)
HEMOGLOBIN: 15.9 g/dL (ref 13.0–17.0)
LYMPHS PCT: 18.2 % (ref 12.0–46.0)
Lymphs Abs: 1.6 10*3/uL (ref 0.7–4.0)
MCHC: 34.3 g/dL (ref 30.0–36.0)
MCV: 94.3 fl (ref 78.0–100.0)
MONOS PCT: 7.8 % (ref 3.0–12.0)
Monocytes Absolute: 0.7 10*3/uL (ref 0.1–1.0)
NEUTROS ABS: 6.4 10*3/uL (ref 1.4–7.7)
Neutrophils Relative %: 72.1 % (ref 43.0–77.0)
Platelets: 189 10*3/uL (ref 150.0–400.0)
RBC: 4.93 Mil/uL (ref 4.22–5.81)
RDW: 13.5 % (ref 11.5–15.5)
WBC: 8.9 10*3/uL (ref 4.0–10.5)

## 2016-10-07 LAB — LIPID PANEL
CHOL/HDL RATIO: 3
Cholesterol: 186 mg/dL (ref 0–200)
HDL: 63.9 mg/dL (ref >39.00)
LDL Cholesterol: 106 mg/dL — ABNORMAL HIGH (ref 0–99)
NONHDL: 122.25
Triglycerides: 81 mg/dL (ref 0.0–149.0)
VLDL: 16.2 mg/dL (ref 0.0–40.0)

## 2016-10-07 LAB — TSH: TSH: 2.2 u[IU]/mL (ref 0.35–4.50)

## 2016-10-08 ENCOUNTER — Other Ambulatory Visit: Payer: Medicare HMO

## 2016-10-08 ENCOUNTER — Ambulatory Visit: Payer: Medicare HMO | Admitting: Neurology

## 2016-10-14 ENCOUNTER — Ambulatory Visit: Payer: PPO | Admitting: Neurology

## 2016-10-15 ENCOUNTER — Encounter: Payer: Self-pay | Admitting: Family Medicine

## 2016-10-15 ENCOUNTER — Ambulatory Visit (INDEPENDENT_AMBULATORY_CARE_PROVIDER_SITE_OTHER): Payer: PPO | Admitting: Family Medicine

## 2016-10-15 VITALS — BP 128/70 | HR 73 | Temp 98.5°F | Ht 70.25 in | Wt 190.0 lb

## 2016-10-15 DIAGNOSIS — R739 Hyperglycemia, unspecified: Secondary | ICD-10-CM

## 2016-10-15 DIAGNOSIS — Z23 Encounter for immunization: Secondary | ICD-10-CM

## 2016-10-15 DIAGNOSIS — I10 Essential (primary) hypertension: Secondary | ICD-10-CM | POA: Diagnosis not present

## 2016-10-15 DIAGNOSIS — Z0000 Encounter for general adult medical examination without abnormal findings: Secondary | ICD-10-CM | POA: Diagnosis not present

## 2016-10-15 MED ORDER — TETANUS-DIPHTH-ACELL PERTUSSIS 5-2.5-18.5 LF-MCG/0.5 IM SUSP: 0.5000 mL | Freq: Once | INTRAMUSCULAR | 0 refills | Status: AC

## 2016-10-15 NOTE — Progress Notes (Signed)
Pre visit review using our clinic review tool, if applicable. No additional management support is needed unless otherwise documented below in the visit note. 

## 2016-10-15 NOTE — Patient Instructions (Addendum)
Call [facility] and see if they can do your tetanus shot- I sent this to them. For some reason, insurance will cover it at pharmacy better than in office. Send me a message when you get the tetanus shot  Work on getting exercise back up, cutting down on chocolate some, bumping the veggies and fruits- in hopes to cut risk for diabetes and get cholesterol back to normal range

## 2016-10-15 NOTE — Assessment & Plan Note (Signed)
controlled on amlodipine-valsartn 5-320mg  as well as hctz. Mild HLD for first time- Encouraged need for healthy eating, regular exercise, weight loss. Consider statin if does not improve

## 2016-10-15 NOTE — Assessment & Plan Note (Signed)
cbg 116 in 2017. Work on weight loss and healthy eating

## 2016-10-15 NOTE — Progress Notes (Signed)
Phone: [phone]  Subjective:  Patient presents today for their annual physical. Chief complaint-noted.   See problem oriented charting- ROS- full  review of systems was completed and negative including No chest pain or shortness of breath. No headache or blurry vision.  The following were reviewed and entered/updated in epic: Past Medical History:  Diagnosis Date  . Allergy    seasonal  . Asthma    as a child  . Calcium oxalate renal stones   . Cataract 2013  . Diverticulosis   . ED (erectile dysfunction)   . GERD (gastroesophageal reflux disease)   . Hypertension   . Melanoma of back ([place]) 1974  . Nail fungal infection   . Osteoarthritis of left knee 03/02/2014   Patient Active Problem List   Diagnosis Date Noted  . MCI (mild cognitive impairment) 08/22/2015    Priority: High  . Hyperglycemia 10/15/2016    Priority: Medium  . Essential hypertension 02/09/2008    Priority: Medium  . History of melanoma 02/09/2008    Priority: Medium  . Osteoarthritis of left knee 03/02/2014    Priority: Low  . VARICOSE VEINS LOWER EXTREMITIES W/INFLAMMATION 02/20/2010    Priority: Low  . GERD 11/21/2008    Priority: Low  . Actinic keratosis 10/12/2008    Priority: Low  . ERECTILE DYSFUNCTION 02/09/2008    Priority: Low  . RENAL CALCULUS, HX OF 02/09/2008    Priority: Low   Past Surgical History:  Procedure Laterality Date  . HERNIA REPAIR     x 2  . left leg stripping    . lymph node disection  1981  . melanoma removal of back   1974  . NASAL POLYP SURGERY    . skin cancer    . TONSILLECTOMY  1974    Family History  Problem Relation Age of Onset  . Colon cancer Father   . Colon cancer Paternal Uncle   . Hypertension    . Colon cancer Paternal Uncle     Medications- reviewed and updated Current Outpatient Prescriptions  Medication Sig Dispense Refill  . amLODipine-valsartan (EXFORGE) 5-320 MG tablet Take 1 tablet by mouth daily. 90 tablet 3  . aspirin 81 MG  tablet Take 81 mg by mouth daily.    . calcium-vitamin D (OSCAL WITH D) 500-200 MG-UNIT per tablet Take 2 tablets by mouth every morning.     Marland Kitchen CINNAMON PO Take by mouth.    . hydrochlorothiazide (HYDRODIURIL) 25 MG tablet Take 1 tablet (25 mg total) by mouth daily. 90 tablet 3  . Multiple Vitamins-Minerals (MULTIVITAMIN GUMMIES ADULT PO) Take 2 each by mouth every morning.    . Tdap (BOOSTRIX) 5-2.5-18.5 LF-MCG/0.5 injection Inject 0.5 mLs into the muscle once. 0.5 mL 0   No current facility-administered medications for this visit.     Allergies-reviewed and updated No Known Allergies  Social History   Social History  . Marital status: Married    Spouse name: N/A  . Number of children: 2  . Years of education: N/A   Occupational History  . Retired    Social History Main Topics  . Smoking status: Never Smoker  . Smokeless tobacco: Never Used  . Alcohol use 3.0 oz/week    5 Glasses of wine per week     Comment: daily  . Drug use: No  . Sexual activity: Yes   Other Topics Concern  . None   Social History Narrative   Married ([name]) 1965. 2 sons- 1 in [place].  3 grandsons.       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 Oropharynx normal. Good dentition Neck: no thyromegaly CV: RRR no murmurs rubs or gallops Lungs: CTAB no crackles, wheeze, rhonchi Abdomen: soft/nontender/nondistended/normal bowel sounds. No rebound or guarding. overweight Ext: no edema, 2+ PT pulses Skin: warm, dry Neuro: grossly normal, moves all extremities, PERRLA Declines rectal- will consider at  urology  Assessment/Plan:  75 y.o. male presenting for annual physical.  Health Maintenance counseling: 1. Anticipatory guidance: Patient counseled regarding regular dental exams, eye exams, wearing seatbelts.  2. Risk factor reduction:  Advised patient of need for regular exercise and diet rich and fruits and vegetables to reduce risk of heart attack and stroke.  Wt Readings from Last 3 Encounters:  10/15/16 190 lb (86.2 kg)  03/14/16 198 lb (89.8 kg)  11/29/15 194 lb (88 kg)  states exercise down and eating more poorly than normal- fair amount of chocolate- advised patient to get exercise back up and improve eating habits.  3. Immunizations/screenings/ancillary studies Immunization History  Administered Date(s) Administered  . Hepatitis A 08/11/2007  . Influenza Split 09/08/2012  . Influenza Whole 09/26/2008, 10/04/2009, 09/04/2010, 09/03/2011  . Influenza, High Dose Seasonal PF 10/06/2013, 11/08/2014, 10/15/2016  . Influenza,inj,Quad PF,36+ Mos 08/22/2015  . Pneumococcal Conjugate-13 12/08/2014  . Pneumococcal Polysaccharide-23 01/28/2006  . Td 01/28/2006  . Zoster 12/08/2014   Health Maintenance Due  Topic Date Due  . TETANUS/TDAP - sent [facility] 01/29/2016   4. Prostate cancer screening- nocturia 3x a night stable- opts out of prostate cancer screening. May get with Dr. Alinda Money Lab Results  Component Value Date   PSA 1.84 09/21/2015   PSA 1.65 06/02/2014   PSA 1.50 06/01/2013   5. Colon cancer screening - 2015 so due in 2020 6. Skin cancer screening- follows with Dr. Barnabas Lister [facility] with history of melanoma, AKs  Status of chronic or acute concerns  HTN- controlled on amlodipine-valsartn 5-320mg  as well as hctz  On clinical trials for MCI- with alzheimers disease but not true dementia yet  Passed kidney stone in late September- will take to Dr. Shanon Payor ortho for left knee- synvisc helps. Advised trial biking as exercise down  Sees optho- monitoring for  glaucoma  Essential hypertension controlled on amlodipine-valsartn 5-320mg  as well as hctz. Mild HLD for first time- Encouraged need for healthy eating, regular exercise, weight loss. Consider statin if does not improve  Hyperglycemia cbg 116 in 2017. Work on [employer] loss and healthy eating   Advised AWV 6 months with Manuela Schwartz, CPE 1 year  Orders Placed This Encounter  Procedures  . Flu vaccine HIGH DOSE PF    Meds ordered this encounter  Medications  . Tdap (BOOSTRIX) 5-2.5-18.5 LF-MCG/0.5 injection    Sig: Inject 0.5 mLs into the muscle once.    Dispense:  0.5 mL    Refill:  0    Return precautions advised.   Garret Reddish, MD

## 2016-11-06 DIAGNOSIS — N200 Calculus of kidney: Secondary | ICD-10-CM | POA: Diagnosis not present

## 2016-11-06 DIAGNOSIS — N5201 Erectile dysfunction due to arterial insufficiency: Secondary | ICD-10-CM | POA: Diagnosis not present

## 2016-11-11 ENCOUNTER — Other Ambulatory Visit: Payer: Self-pay | Admitting: Family Medicine

## 2017-01-31 DIAGNOSIS — H40013 Open angle with borderline findings, low risk, bilateral: Secondary | ICD-10-CM | POA: Diagnosis not present

## 2017-01-31 DIAGNOSIS — H40053 Ocular hypertension, bilateral: Secondary | ICD-10-CM | POA: Diagnosis not present

## 2017-02-11 DIAGNOSIS — Z1283 Encounter for screening for malignant neoplasm of skin: Secondary | ICD-10-CM | POA: Diagnosis not present

## 2017-02-11 DIAGNOSIS — X32XXXD Exposure to sunlight, subsequent encounter: Secondary | ICD-10-CM | POA: Diagnosis not present

## 2017-02-11 DIAGNOSIS — Z08 Encounter for follow-up examination after completed treatment for malignant neoplasm: Secondary | ICD-10-CM | POA: Diagnosis not present

## 2017-02-11 DIAGNOSIS — D225 Melanocytic nevi of trunk: Secondary | ICD-10-CM | POA: Diagnosis not present

## 2017-02-11 DIAGNOSIS — L570 Actinic keratosis: Secondary | ICD-10-CM | POA: Diagnosis not present

## 2017-02-11 DIAGNOSIS — Z85820 Personal history of malignant melanoma of skin: Secondary | ICD-10-CM | POA: Diagnosis not present

## 2017-02-11 DIAGNOSIS — C44519 Basal cell carcinoma of skin of other part of trunk: Secondary | ICD-10-CM | POA: Diagnosis not present

## 2017-02-19 DIAGNOSIS — H40051 Ocular hypertension, right eye: Secondary | ICD-10-CM | POA: Diagnosis not present

## 2017-03-11 DIAGNOSIS — Z85828 Personal history of other malignant neoplasm of skin: Secondary | ICD-10-CM | POA: Diagnosis not present

## 2017-03-11 DIAGNOSIS — C44619 Basal cell carcinoma of skin of left upper limb, including shoulder: Secondary | ICD-10-CM | POA: Diagnosis not present

## 2017-03-11 DIAGNOSIS — D225 Melanocytic nevi of trunk: Secondary | ICD-10-CM | POA: Diagnosis not present

## 2017-03-11 DIAGNOSIS — C44519 Basal cell carcinoma of skin of other part of trunk: Secondary | ICD-10-CM | POA: Diagnosis not present

## 2017-03-11 DIAGNOSIS — D485 Neoplasm of uncertain behavior of skin: Secondary | ICD-10-CM | POA: Diagnosis not present

## 2017-03-11 DIAGNOSIS — Z08 Encounter for follow-up examination after completed treatment for malignant neoplasm: Secondary | ICD-10-CM | POA: Diagnosis not present

## 2017-03-19 DIAGNOSIS — H40052 Ocular hypertension, left eye: Secondary | ICD-10-CM | POA: Diagnosis not present

## 2017-04-15 ENCOUNTER — Ambulatory Visit (INDEPENDENT_AMBULATORY_CARE_PROVIDER_SITE_OTHER): Payer: PPO

## 2017-04-15 VITALS — BP 130/80 | HR 69 | Ht 71.0 in | Wt 196.0 lb

## 2017-04-15 DIAGNOSIS — Z0000 Encounter for general adult medical examination without abnormal findings: Secondary | ICD-10-CM

## 2017-04-15 NOTE — Patient Instructions (Addendum)
Wesley Fuller , Thank you for taking time to come for your Medicare Wellness Visit. I appreciate your ongoing commitment to your health goals. Please review the following plan we discussed and let me know if I can assist you in the future.   A Tetanus is recommended every 10 years. Medicare covers a tetanus if you have a cut or wound; otherwise, there may be a charge. If you had not had a tetanus with pertusses, known as the Tdap, you can take this anytime.   We have a new shingles vaccination that is covered under Part D so you will get this at the pharmacy  The new vaccination is called the shingrix; it is a series of 2; it is not alive vaccine. It is 95% effective.    These are the goals we discussed: Goals    . Exercise 150 minutes per week (moderate activity)          Keep exercising more time The problem is overcoming schedule limitations with playing golf  Plays with the same group x 25 years and will work out the other exercise  Can go to the gym on Monday's and ad lib; do what you want to do .        This is a list of the screening recommended for you and due dates:  Health Maintenance  Topic Date Due  . Tetanus Vaccine  01/29/2016  . Flu Shot  07/23/2017  . Colon Cancer Screening  02/10/2019  . Pneumonia vaccines  Completed    Prevention of falls: Remove rugs or any tripping hazards in the home Use Non slip mats in bathtubs and showers Placing grab bars next to the toilet and or shower Placing handrails on both sides of the stair way Adding extra lighting in the home.   Personal safety issues reviewed:  1. Consider starting a community watch program per [facility] 2.  Changes batteries is smoke detector and/or carbon monoxide detector  3.  If you have firearms; keep them in a safe place 4.  Wear protection when in the sun; Always wear sunscreen or a hat; It is good to have your doctor check your skin annually or review any new areas of concern 5. Driving  safety; Keep in the right lane; stay 3 car lengths behind the car in front of you on the highway; look 3 times prior to pulling out; carry your cell phone everywhere you go!    Learn about the Yellow Dot program:  The program allows first responders at your emergency to have access to who your physician is, as well as your medications and medical conditions.  Citizens requesting the Yellow Dot Packages should contact Master Corporal Nunzio Cobbs at the [facility] [phone] for the first week of the program and beginning the week after Easter citizens should contact their Scientist, physiological.        Fall Prevention in the Home Falls can cause injuries. They can happen to people of all ages. There are many things you can do to make your home safe and to help prevent falls. What can I do on the outside of my home?  Regularly fix the edges of walkways and driveways and fix any cracks.  Remove anything that might make you trip as you walk through a door, such as a raised step or threshold.  Trim any bushes or trees on the path to your home.  Use bright outdoor lighting.  Clear any  walking paths of anything that might make someone trip, such as rocks or tools.  Regularly check to see if handrails are loose or broken. Make sure that both sides of any steps have handrails.  Any raised decks and porches should have guardrails on the edges.  Have any leaves, snow, or ice cleared regularly.  Use sand or salt on walking paths during winter.  Clean up any spills in your garage right away. This includes oil or grease spills. What can I do in the bathroom?  Use night lights.  Install grab bars by the toilet and in the tub and shower. Do not use towel bars as grab bars.  Use non-skid mats or decals in the tub or shower.  If you need to sit down in the shower, use a plastic, non-slip stool.  Keep the floor dry. Clean up any water that spills on the  floor as soon as it happens.  Remove soap buildup in the tub or shower regularly.  Attach bath mats securely with double-sided non-slip rug tape.  Do not have throw rugs and other things on the floor that can make you trip. What can I do in the bedroom?  Use night lights.  Make sure that you have a light by your bed that is easy to reach.  Do not use any sheets or blankets that are too big for your bed. They should not hang down onto the floor.  Have a firm chair that has side arms. You can use this for support while you get dressed.  Do not have throw rugs and other things on the floor that can make you trip. What can I do in the kitchen?  Clean up any spills right away.  Avoid walking on wet floors.  Keep items that you use a lot in easy-to-reach places.  If you need to reach something above you, use a strong step stool that has a grab bar.  Keep electrical cords out of the way.  Do not use floor polish or wax that makes floors slippery. If you must use wax, use non-skid floor wax.  Do not have throw rugs and other things on the floor that can make you trip. What can I do with my stairs?  Do not leave any items on the stairs.  Make sure that there are handrails on both sides of the stairs and use them. Fix handrails that are broken or loose. Make sure that handrails are as long as the stairways.  Check any carpeting to make sure that it is firmly attached to the stairs. Fix any carpet that is loose or worn.  Avoid having throw rugs at the top or bottom of the stairs. If you do have throw rugs, attach them to the floor with carpet tape.  Make sure that you have a light switch at the top of the stairs and the bottom of the stairs. If you do not have them, ask someone to add them for you. What else can I do to help prevent falls?  Wear shoes that:  Do not have high heels.  Have rubber bottoms.  Are comfortable and fit you well.  Are closed at the toe. Do not wear  sandals.  If you use a stepladder:  Make sure that it is fully opened. Do not climb a closed stepladder.  Make sure that both sides of the stepladder are locked into place.  Ask someone to hold it for you, if possible.  Clearly mark and  make sure that you can see:  Any grab bars or handrails.  First and last steps.  Where the edge of each step is.  Use tools that help you move around (mobility aids) if they are needed. These include:  Canes.  Walkers.  Scooters.  Crutches.  Turn on the lights when you go into a dark area. Replace any light bulbs as soon as they burn out.  Set up your furniture so you have a clear path. Avoid moving your furniture around.  If any of your floors are uneven, fix them.  If there are any pets around you, be aware of where they are.  Review your medicines with your doctor. Some medicines can make you feel dizzy. This can increase your chance of falling. Ask your doctor what other things that you can do to help prevent falls. This information is not intended to replace advice given to you by your health care provider. Make sure you discuss any questions you have with your health care provider. Document Released: 10/05/2009 Document Revised: 05/16/2016 Document Reviewed: 01/13/2015 Elsevier Interactive Patient Education  2017 [place] Maintenance, Male A healthy lifestyle and preventive care is important for your health and wellness. Ask your health care provider about what schedule of regular examinations is right for you. What should I know about weight and diet?  Eat a Healthy Diet  Eat plenty of vegetables, fruits, whole grains, low-fat dairy products, and lean protein.  Do not eat a lot of foods high in solid fats, added sugars, or salt. Maintain a Healthy Weight  Regular exercise can help you achieve or maintain a healthy weight. You should:  Do at least 150 minutes of exercise each week. The exercise should increase  your heart rate and make you sweat (moderate-intensity exercise).  Do strength-training exercises at least twice a week. Watch Your Levels of Cholesterol and Blood Lipids  Have your blood tested for lipids and cholesterol every 5 years starting at 76 years of age. If you are at high risk for heart disease, you should start having your blood tested when you are 76 years old. You may need to have your cholesterol levels checked more often if:  Your lipid or cholesterol levels are high.  You are older than 76 years of age.  You are at high risk for heart disease. What should I know about cancer screening? Many types of cancers can be detected early and may often be prevented. Lung Cancer  You should be screened every year for lung cancer if:  You are a current smoker who has smoked for at least 30 years.  You are a former smoker who has quit within the past 15 years.  Talk to your health care provider about your screening options, when you should start screening, and how often you should be screened. Colorectal Cancer  Routine colorectal cancer screening usually begins at 76 years of age and should be repeated every 5-10 years until you are 76 years old. You may need to be screened more often if early forms of precancerous polyps or small growths are found. Your health care provider may recommend screening at an earlier age if you have risk factors for colon cancer.  Your health care provider may recommend using home test kits to check for hidden blood in the stool.  A small camera at the end of a tube can be used to examine your colon (sigmoidoscopy or colonoscopy). This checks for the earliest forms of  colorectal cancer. Prostate and Testicular Cancer  Depending on your age and overall health, your health care provider may do certain tests to screen for prostate and testicular cancer.  Talk to your health care provider about any symptoms or concerns you have about testicular or  prostate cancer. Skin Cancer  Check your skin from head to toe regularly.  Tell your health care provider about any new moles or changes in moles, especially if:  There is a change in a mole's size, shape, or color.  You have a mole that is larger than a pencil eraser.  Always use sunscreen. Apply sunscreen liberally and repeat throughout the day.  Protect yourself by wearing long sleeves, pants, a wide-brimmed hat, and sunglasses when outside. What should I know about heart disease, diabetes, and high blood pressure?  If you are 51-6 years of age, have your blood pressure checked every 3-5 years. If you are 73 years of age or older, have your blood pressure checked every year. You should have your blood pressure measured twice-once when you are at a hospital or clinic, and once when you are not at a hospital or clinic. Record the average of the two measurements. To check your blood pressure when you are not at a hospital or clinic, you can use:  An automated blood pressure machine at a pharmacy.  A home blood pressure monitor.  Talk to your health care provider about your target blood pressure.  If you are between 70-2 years old, ask your health care provider if you should take aspirin to prevent heart disease.  Have regular diabetes screenings by checking your fasting blood sugar level.  If you are at a normal weight and have a low risk for diabetes, have this test once every three years after the age of 81.  If you are overweight and have a high risk for diabetes, consider being tested at a younger age or more often.  A one-time screening for abdominal aortic aneurysm (AAA) by ultrasound is recommended for men aged 48-75 years who are current or former smokers. What should I know about preventing infection? Hepatitis B  If you have a higher risk for hepatitis B, you should be screened for this virus. Talk with your health care provider to find out if you are at risk for  hepatitis B infection. Hepatitis C  Blood testing is recommended for:  Everyone born from [birth year] through 1965.  Anyone with known risk factors for hepatitis C. Sexually Transmitted Diseases (STDs)  You should be screened each year for STDs including gonorrhea and chlamydia if:  You are sexually active and are younger than 76 years of age.  You are older than 76 years of age and your health care provider tells you that you are at risk for this type of infection.  Your sexual activity has changed since you were last screened and you are at an increased risk for chlamydia or gonorrhea. Ask your health care provider if you are at risk.  Talk with your health care provider about whether you are at high risk of being infected with HIV. Your health care provider may recommend a prescription medicine to help prevent HIV infection. What else can I do?  Schedule regular health, dental, and eye exams.  Stay current with your vaccines (immunizations).  Do not use any tobacco products, such as cigarettes, chewing tobacco, and e-cigarettes. If you need help quitting, ask your health care provider.  Limit alcohol intake to no more than 2  drinks per day. One drink equals 12 ounces of beer, 5 ounces of wine, or 1 ounces of hard liquor.  Do not use street drugs.  Do not share needles.  Ask your health care provider for help if you need support or information about quitting drugs.  Tell your health care provider if you often feel depressed.  Tell your health care provider if you have ever been abused or do not feel safe at home. This information is not intended to replace advice given to you by your health care provider. Make sure you discuss any questions you have with your health care provider. Document Released: 06/06/2008 Document Revised: 08/07/2016 Document Reviewed: 09/12/2015 Elsevier Interactive Patient Education  2017 [employer].

## 2017-04-15 NOTE — Progress Notes (Signed)
Subjective:   Wesley Fuller is a 76 y.o. male who presents for Medicare Annual/Subsequent preventive examination.  The Patient was informed that the wellness visit is to identify future health risk and educate and initiate measures that can reduce risk for increased disease through the lifespan.    NO ROS; Medicare Wellness Visit Annual exam is in October Potential for early Alz   Has a wife Married x 52 years Children 2 One is in [place] state [place]);  The other is in [place], [place] 3   Describes health as good, fair or great?  Health is fair; States he has been dx with a memory issues; early onset   Preventive Screening -Counseling & Management  Colonoscopy 01/2014 - 01/2019 PSA 08/2015 1.84 - had stones and was seeing urology  Takes calcium and vit D per [facility]  Smoking history- never smoked   Second Hand Smoke status; No Smokers in the home ETOH - has 5 drinks a week    Medication adherence or issues? No issues  RISK FACTORS Diet BMI 27  Wife has carpal tunnel Breakfast; smoothies;  Lunch; sandwich ham and cheese;  Meal in evenings; wife cooks  Regular exercise  Still plays golf x 2 per week with golfing buddies Does lawn work Used to cut the grass but states he does not anymore Goes to the silver sneaker program  Trying to work out how he can play golf and go to exercise as there is a conflict. Discussed maintaining his golf which he enjoys with friends and going on Monday for ad hoc plan, to do what he feels like; weight, treadmill or other   Cardiac Risk Factors:  Advanced aged > 75 in men; >65 in women Hyperlipidemia - ratio is 3   HDL 63 Diabetes/ 116  Family History of colon cancer Obesity neg; BMI 27    Fall risk: left knee but does not want surgery Given education on "Fall Prevention in the Home" for more safety tips the patient can apply as appropriate.    Mobility of Functional changes this year? No but does not do lawn work    2 level; not planning to stay there forever Bought a home that had a guest room down stairs  Remodeling some now  Thinking about moving to the [place] area if they move Would look for a one level home   Safety  community, wears sunscreen- shared experience with melanoma  Wife is great support and a great advocate  Mental Health:  Any emotional problems? Anxious, depressed, irritable, sad or blue? no Denies feeling depressed or hopeless; voices pleasure in daily life How many social activities have you been engaged in within the last 2 weeks? no  Enjoys his environment    Hearing Screening Comments: Has hearing aid on left side Just got it back today  Vision Screening Comments: Vision checks  At least annually  Macular degeneration; been treated  Being checked May 8th   Activities of Daily Living - See functional screen   Cognitive testing;  Going to the Geriatric center at the [facility]  center Is going through an entire series of programs Asked what areas of his life is impacted? Could not state directly;  6CIT was positive;  Word recall issues;  MMSE 29; recall was 3/3 today; orientation good. Picture was rotated but 2 lines crossed  [employer] understanding of memory issues; has good support and continues to engage with travel friends and golf friends; In process  of planning a trip to [place]. No distress noted    Advanced Directives - yes per his report   Patient Care Team: Marin Olp, MD as PCP - General (Family Medicine)  Dr. Dayton Martes ; dermatologist once a year    Immunization History  Administered Date(s) Administered  . Hepatitis A 08/11/2007  . Influenza Split 09/08/2012  . Influenza Whole 09/26/2008, 10/04/2009, 09/04/2010, 09/03/2011  . Influenza, High Dose Seasonal PF 10/06/2013, 11/08/2014, 10/15/2016  . Influenza,inj,Quad PF,36+ Mos 08/22/2015  . Pneumococcal Conjugate-13 12/08/2014  . Pneumococcal Polysaccharide-23 01/28/2006  . Td  01/28/2006  . Zoster 12/08/2014   Required Immunizations needed today  Screening test up to date or reviewed for plan of completion Health Maintenance Due  Topic Date Due  . TETANUS/TDAP  01/29/2016   Will defer to the pharmacy and wrote note regarding getting the tdap  Cardiac Risk Factors include: advanced age (>29men, >32 women);male gender;family history of premature cardiovascular disease     Objective:    Vitals: BP 130/80   Pulse 69   Ht 5\' 11"  (1.803 m)   Wt 196 lb (88.9 kg)   SpO2 97%   BMI 27.34 kg/m   Body mass index is 27.34 kg/m.  Tobacco History  Smoking Status  . Never Smoker  Smokeless Tobacco  . Never Used     Counseling given: Yes   Past Medical History:  Diagnosis Date  . Allergy    seasonal  . Asthma    as a child  . Calcium oxalate renal stones   . Cataract 2013  . Diverticulosis   . ED (erectile dysfunction)   . GERD (gastroesophageal reflux disease)   . Hypertension   . Melanoma of back ([place]) 1974  . Nail fungal infection   . Osteoarthritis of left knee 03/02/2014   Past Surgical History:  Procedure Laterality Date  . HERNIA REPAIR     x 2  . left leg stripping    . lymph node disection  1981  . melanoma removal of back   1974  . NASAL POLYP SURGERY    . skin cancer    . TONSILLECTOMY  1974   Family History  Problem Relation Age of Onset  . Colon cancer Father   . Colon cancer Paternal Uncle   . Hypertension    . Colon cancer Paternal Uncle    History  Sexual Activity  . Sexual activity: Yes    Outpatient Encounter Prescriptions as of 04/15/2017  Medication Sig  . amLODipine-valsartan (EXFORGE) 5-320 MG tablet TAKE 1 TABLET BY MOUTH DAILY.  Marland Kitchen aspirin 81 MG tablet Take 81 mg by mouth daily.  . calcium-vitamin D (OSCAL WITH D) 500-200 MG-UNIT per tablet Take 2 tablets by mouth every morning.   Marland Kitchen CINNAMON PO Take by mouth.  . hydrochlorothiazide (HYDRODIURIL) 25 MG tablet TAKE 1 TABLET (25 MG TOTAL) BY MOUTH DAILY.  .  Multiple Vitamins-Minerals (MULTIVITAMIN GUMMIES ADULT PO) Take 2 each by mouth every morning.   No facility-administered encounter medications on file as of 04/15/2017.     Activities of Daily Living In your present state of health, do you have any difficulty performing the following activities: 04/15/2017  Hearing? Y  Vision? N  Difficulty concentrating or making decisions? Y  Walking or climbing stairs? N  Dressing or bathing? N  Doing errands, shopping? N  Preparing Food and eating ? N  Using the Toilet? N  In the past six months, have you accidently leaked urine? N  Do you have problems with loss of bowel control? N  Managing your Medications? N  Managing your Finances? N  Housekeeping or managing your Housekeeping? N  Some recent data might be hidden    Patient Care Team: Marin Olp, MD as PCP - General (Family Medicine)   Assessment:     Exercise Activities and Dietary recommendations Current Exercise Habits: Home exercise routine;Structured exercise class, Time (Minutes): 60, Frequency (Times/Week): 4, Weekly Exercise (Minutes/Week): 240, Intensity: Moderate  Goals    . Exercise 150 minutes per week (moderate activity)          Keep exercising more time The problem is overcoming schedule limitations with playing golf  Plays with the same group x 25 years and will work out the other exercise  Can go to the gym on Monday's and ad lib; do what you want to do .       Fall Risk Fall Risk  04/15/2017 03/14/2016 03/30/2015 03/02/2014  Falls in the past year? No No No No   Depression Screen PHQ 2/9 Scores 04/15/2017 03/14/2016 03/30/2015 03/02/2014  PHQ - 2 Score 0 0 0 0    Cognitive Function   Montreal Cognitive Assessment  11/29/2015  Visuospatial/ Executive (0/5) 5  Naming (0/3) 3  Attention: Read list of digits (0/2) 2  Attention: Read list of letters (0/1) 1  Attention: Serial 7 subtraction starting at 100 (0/3) 3  Language: Repeat phrase (0/2) 2  Language :  Fluency (0/1) 1  Abstraction (0/2) 2  Delayed Recall (0/5) 2  Orientation (0/6) 6  Total 27  Adjusted Score (based on education) 27   6CIT Screen 04/15/2017  What Year? 0 points  What month? 0 points  What time? 0 points  Count back from 20 4 points  Months in reverse 2 points  Repeat phrase 2 points  Total Score 8  missed counting backward from 20; missed one month in reverse; got the number of the address incorrect;   Discussed Alz asso and classes but states he and his wife went to a class  Immunization History  Administered Date(s) Administered  . Hepatitis A 08/11/2007  . Influenza Split 09/08/2012  . Influenza Whole 09/26/2008, 10/04/2009, 09/04/2010, 09/03/2011  . Influenza, High Dose Seasonal PF 10/06/2013, 11/08/2014, 10/15/2016  . Influenza,inj,Quad PF,36+ Mos 08/22/2015  . Pneumococcal Conjugate-13 12/08/2014  . Pneumococcal Polysaccharide-23 01/28/2006  . Td 01/28/2006  . Zoster 12/08/2014   Screening Tests Health Maintenance  Topic Date Due  . TETANUS/TDAP  01/29/2016  . INFLUENZA VACCINE  07/23/2017  . COLONOSCOPY  02/10/2019  . PNA vac Low Risk Adult  Completed      Plan:      PCP Notes  Health Maintenance To fup on Tdap at the pharmacy Educated regarding the shingrix and may want to pick up at the pharmacy   Abnormal Screens MMSE 29/30 with word finding issues but continues to express his ideas well.  Affect appropriate; no denies depression; optimistic   Referrals is going to the Geriatric center at [facility]   Patient concerns; monitoring memory and plans to increase his exercise   Nurse Concerns; None at present; fup regarding memory continues   Next PCP apt scheduled in October    During the course of the visit the patient was educated and counseled about the following appropriate screening and preventive services:   Vaccines to include Pneumoccal, Influenza, Hepatitis B, Td, Zostavax, HCV  Electrocardiogram  Cardiovascular  Disease  Colorectal cancer screening planned for 2020  Diabetes screening -neg  Prostate Cancer Screening UR;   Glaucoma screening has MD and followed   Nutrition counseling   Smoking cessation counseling never smoked   Patient Instructions (the written plan) was given to the patient.    [name], RN  04/15/2017

## 2017-04-15 NOTE — Progress Notes (Signed)
I have reviewed and agree with note, evaluation, plan. [facility] to follow at [facility] for [facility]. Appears largely stable.   Garret Reddish, MD

## 2017-04-16 DIAGNOSIS — Z08 Encounter for follow-up examination after completed treatment for malignant neoplasm: Secondary | ICD-10-CM | POA: Diagnosis not present

## 2017-04-16 DIAGNOSIS — Z85828 Personal history of other malignant neoplasm of skin: Secondary | ICD-10-CM | POA: Diagnosis not present

## 2017-04-18 ENCOUNTER — Encounter: Payer: Self-pay | Admitting: Family Medicine

## 2017-05-01 DIAGNOSIS — M25511 Pain in right shoulder: Secondary | ICD-10-CM | POA: Diagnosis not present

## 2017-05-01 DIAGNOSIS — M25562 Pain in left knee: Secondary | ICD-10-CM | POA: Diagnosis not present

## 2017-07-02 DIAGNOSIS — M1712 Unilateral primary osteoarthritis, left knee: Secondary | ICD-10-CM | POA: Diagnosis not present

## 2017-07-09 DIAGNOSIS — M1711 Unilateral primary osteoarthritis, right knee: Secondary | ICD-10-CM | POA: Diagnosis not present

## 2017-07-16 DIAGNOSIS — M1712 Unilateral primary osteoarthritis, left knee: Secondary | ICD-10-CM | POA: Diagnosis not present

## 2017-09-10 ENCOUNTER — Encounter: Payer: Self-pay | Admitting: Family Medicine

## 2017-09-11 ENCOUNTER — Encounter: Payer: Self-pay | Admitting: Family Medicine

## 2017-09-11 ENCOUNTER — Ambulatory Visit (INDEPENDENT_AMBULATORY_CARE_PROVIDER_SITE_OTHER): Payer: PPO | Admitting: Family Medicine

## 2017-09-11 VITALS — BP 132/78 | HR 64 | Temp 99.0°F | Ht 71.0 in | Wt 197.0 lb

## 2017-09-11 DIAGNOSIS — I10 Essential (primary) hypertension: Secondary | ICD-10-CM | POA: Diagnosis not present

## 2017-09-11 DIAGNOSIS — Z23 Encounter for immunization: Secondary | ICD-10-CM | POA: Diagnosis not present

## 2017-09-11 MED ORDER — CHLORTHALIDONE 25 MG PO TABS: 25.0000 mg | ORAL_TABLET | Freq: Every day | ORAL | 3 refills | Status: DC

## 2017-09-11 NOTE — Assessment & Plan Note (Signed)
S: controlled on amlodipine-valsartan 5-320mg  and hctz 25mg . At home having several numbers into the 140s. Also wants to start aricept.  BP Readings from Last 3 Encounters:  09/11/17 132/78  04/15/17 130/80  10/15/16 128/70  A/P: We discussed blood pressure goal of <140/90. Given home readings higher and want to start aricept- will stop hctz and start chlorthalidone while continuing exforge

## 2017-09-11 NOTE — Addendum Note (Signed)
Addended by: Wynn Banker H on: 09/11/2017 04:10 PM   Modules accepted: Orders

## 2017-09-11 NOTE — Patient Instructions (Addendum)
Flu shot today  Start chlorthalidone 25mg   Stop hydrochlorothiazide 25mg   Continue exforge   See you back in October for recheck and see if we can start aricept. Keep a log of home blood pressures before you come into see [place]  If area on scalp doesn't continue to improve- see dermatology

## 2017-09-11 NOTE — Progress Notes (Signed)
Subjective:  Wesley Fuller is a 76 y.o. year old very pleasant male patient who presents for/with See problem oriented charting ROS- No chest pain or shortness of breath. No headache or blurry vision.  Admits to some memory issues- also hit back of scalp about a month ago   Past Medical History-  Patient Active Problem List   Diagnosis Date Noted  . MCI (mild cognitive impairment) 08/22/2015    Priority: High  . Hyperglycemia 10/15/2016    Priority: Medium  . Essential hypertension 02/09/2008    Priority: Medium  . History of melanoma 02/09/2008    Priority: Medium  . Osteoarthritis of left knee 03/02/2014    Priority: Low  . VARICOSE VEINS LOWER EXTREMITIES W/INFLAMMATION 02/20/2010    Priority: Low  . GERD 11/21/2008    Priority: Low  . Actinic keratosis 10/12/2008    Priority: Low  . ERECTILE DYSFUNCTION 02/09/2008    Priority: Low  . RENAL CALCULUS, HX OF 02/09/2008    Priority: Low    Medications- reviewed and updated Current Outpatient Prescriptions  Medication Sig Dispense Refill  . amLODipine-valsartan (EXFORGE) 5-320 MG tablet TAKE 1 TABLET BY MOUTH DAILY. 90 tablet 3  . aspirin 81 MG tablet Take 81 mg by mouth daily.    . calcium-vitamin D (OSCAL WITH D) 500-200 MG-UNIT per tablet Take 2 tablets by mouth every morning.     Marland Kitchen CINNAMON PO Take by mouth.    . Multiple Vitamins-Minerals (MULTIVITAMIN GUMMIES ADULT PO) Take 2 each by mouth every morning.    . chlorthalidone (HYGROTON) 25 MG tablet Take 1 tablet (25 mg total) by mouth daily. 90 tablet 3   No current facility-administered medications for this visit.     Objective: BP 132/78 (BP Location: Left Arm, Patient Position: Sitting, Cuff Size: Normal)   Pulse 64   Temp 99 F (37.2 C) (Oral)   Ht 5\' 11"  (1.803 m)   Wt 197 lb (89.4 kg)   SpO2 96%   BMI 27.48 kg/m  Gen: NAD, resting comfortably CV: RRR no murmurs rubs or gallops Lungs: CTAB no crackles, wheeze, rhonchi Abdomen:  soft/nontender/nondistended/normal bowel sounds. No rebound or guarding.  Ext: trace edema Skin: warm, dry, on scalp- area of dried blood   Assessment/Plan:  Scalp lesion- states he hit his head- appears to be dried/congealed blood perhaps on top of seborrheic keratosis- discussed if doesn't improve would consider dermatology follow up (he is already established  Essential hypertension S: controlled on amlodipine-valsartan 5-320mg  and hctz 25mg . At home having several numbers into the 140s. Also wants to start aricept.  BP Readings from Last 3 Encounters:  09/11/17 132/78  04/15/17 130/80  10/15/16 128/70  A/P: We discussed blood pressure goal of <140/90. Given home readings higher and want to start aricept- will stop hctz and start chlorthalidone while continuing exforge   Future Appointments Date Time Provider [place]  10/09/2017 7:45 AM [facility]  10/17/2017 1:15 PM Marin Olp, MD [facility] None  04/21/2018 1:00 PM [name], RN [facility]   Meds ordered this encounter  Medications  . chlorthalidone (HYGROTON) 25 MG tablet    Sig: Take 1 tablet (25 mg total) by mouth daily.    Dispense:  90 tablet    Refill:  3    Return precautions advised.  Garret Reddish, MD

## 2017-09-24 DIAGNOSIS — X32XXXD Exposure to sunlight, subsequent encounter: Secondary | ICD-10-CM | POA: Diagnosis not present

## 2017-09-24 DIAGNOSIS — L570 Actinic keratosis: Secondary | ICD-10-CM | POA: Diagnosis not present

## 2017-10-07 ENCOUNTER — Telehealth: Payer: Self-pay

## 2017-10-07 DIAGNOSIS — I10 Essential (primary) hypertension: Secondary | ICD-10-CM

## 2017-10-07 DIAGNOSIS — R739 Hyperglycemia, unspecified: Secondary | ICD-10-CM

## 2017-10-07 NOTE — Telephone Encounter (Signed)
Pt coming for labs 10/09/17. Need future orders placed. Thank you.

## 2017-10-07 NOTE — Telephone Encounter (Signed)
Orders placed.

## 2017-10-09 ENCOUNTER — Other Ambulatory Visit: Payer: PPO

## 2017-10-09 ENCOUNTER — Other Ambulatory Visit (INDEPENDENT_AMBULATORY_CARE_PROVIDER_SITE_OTHER): Payer: PPO

## 2017-10-09 DIAGNOSIS — R739 Hyperglycemia, unspecified: Secondary | ICD-10-CM

## 2017-10-09 DIAGNOSIS — I10 Essential (primary) hypertension: Secondary | ICD-10-CM | POA: Diagnosis not present

## 2017-10-09 LAB — CBC
HCT: 47.6 % (ref 39.0–52.0)
Hemoglobin: 16.5 g/dL (ref 13.0–17.0)
MCHC: 34.7 g/dL (ref 30.0–36.0)
MCV: 94.7 fl (ref 78.0–100.0)
Platelets: 179 10*3/uL (ref 150.0–400.0)
RBC: 5.03 Mil/uL (ref 4.22–5.81)
RDW: 13.1 % (ref 11.5–15.5)
WBC: 7.6 10*3/uL (ref 4.0–10.5)

## 2017-10-09 LAB — HEMOGLOBIN A1C: HEMOGLOBIN A1C: 5.3 % (ref 4.6–6.5)

## 2017-10-09 LAB — COMPREHENSIVE METABOLIC PANEL
ALK PHOS: 52 U/L (ref 39–117)
ALT: 22 U/L (ref 0–53)
AST: 23 U/L (ref 0–37)
Albumin: 4 g/dL (ref 3.5–5.2)
BILIRUBIN TOTAL: 1.3 mg/dL — AB (ref 0.2–1.2)
BUN: 22 mg/dL (ref 6–23)
CO2: 31 mEq/L (ref 19–32)
CREATININE: 0.97 mg/dL (ref 0.40–1.50)
Calcium: 9.6 mg/dL (ref 8.4–10.5)
Chloride: 105 mEq/L (ref 96–112)
GFR: 79.82 mL/min (ref >60.00)
GLUCOSE: 119 mg/dL — AB (ref 70–99)
Potassium: 3.7 mEq/L (ref 3.5–5.1)
SODIUM: 143 meq/L (ref 135–145)
TOTAL PROTEIN: 6.1 g/dL (ref 6.0–8.3)

## 2017-10-09 LAB — LIPID PANEL
CHOL/HDL RATIO: 3
Cholesterol: 159 mg/dL (ref 0–200)
HDL: 52.3 mg/dL (ref >39.00)
LDL CALC: 86 mg/dL (ref 0–99)
NonHDL: 106.82
Triglycerides: 105 mg/dL (ref 0.0–149.0)
VLDL: 21 mg/dL (ref 0.0–40.0)

## 2017-10-10 ENCOUNTER — Other Ambulatory Visit: Payer: PPO

## 2017-10-17 ENCOUNTER — Encounter: Payer: Self-pay | Admitting: Family Medicine

## 2017-10-17 ENCOUNTER — Encounter: Payer: PPO | Admitting: Family Medicine

## 2017-10-17 ENCOUNTER — Ambulatory Visit (INDEPENDENT_AMBULATORY_CARE_PROVIDER_SITE_OTHER): Payer: PPO | Admitting: Family Medicine

## 2017-10-17 VITALS — BP 138/82 | HR 68 | Temp 97.7°F | Ht 71.0 in | Wt 194.0 lb

## 2017-10-17 DIAGNOSIS — I10 Essential (primary) hypertension: Secondary | ICD-10-CM | POA: Diagnosis not present

## 2017-10-17 DIAGNOSIS — Z0000 Encounter for general adult medical examination without abnormal findings: Secondary | ICD-10-CM

## 2017-10-17 NOTE — Assessment & Plan Note (Signed)
S: controlled on amlodipine 5mg - valsartan 320mg , chlorthalidone25mg . Less tired since switching . Last few home checks were in 120s over 60s so has improved  BP Readings from Last 3 Encounters:  10/17/17 138/82  09/11/17 132/78  04/15/17 130/80  A/P: We discussed blood pressure goal of <140/90. Continue current meds. Will need to watch BP on aricept if started

## 2017-10-17 NOTE — Progress Notes (Signed)
Phone: [phone]  Subjective:  Patient presents today for their annual physical. Chief complaint-noted.   See problem oriented charting- ROS- full  review of systems was completed and negative including No chest pain or shortness of breath. No headache or blurry vision.   The following were reviewed and entered/updated in epic: Past Medical History:  Diagnosis Date  . Allergy    seasonal  . Asthma    as a child  . Calcium oxalate renal stones   . Cataract 2013  . Diverticulosis   . ED (erectile dysfunction)   . GERD (gastroesophageal reflux disease)   . Hypertension   . Melanoma of back ([place]) 1974  . Nail fungal infection   . Osteoarthritis of left knee 03/02/2014   Patient Active Problem List   Diagnosis Date Noted  . MCI (mild cognitive impairment) 08/22/2015    Priority: High  . Hyperglycemia 10/15/2016    Priority: Medium  . Essential hypertension 02/09/2008    Priority: Medium  . History of melanoma 02/09/2008    Priority: Medium  . Osteoarthritis of left knee 03/02/2014    Priority: Low  . VARICOSE VEINS LOWER EXTREMITIES W/INFLAMMATION 02/20/2010    Priority: Low  . GERD 11/21/2008    Priority: Low  . Actinic keratosis 10/12/2008    Priority: Low  . ERECTILE DYSFUNCTION 02/09/2008    Priority: Low  . RENAL CALCULUS, HX OF 02/09/2008    Priority: Low   Past Surgical History:  Procedure Laterality Date  . HERNIA REPAIR     x 2  . left leg stripping    . lymph node disection  1981  . melanoma removal of back   1974  . NASAL POLYP SURGERY    . skin cancer    . TONSILLECTOMY  1974    Family History  Problem Relation Age of Onset  . Colon cancer Father   . Colon cancer Paternal Uncle   . Colon cancer Paternal Uncle   . Hypertension Unknown     Medications- reviewed and updated Current Outpatient Prescriptions  Medication Sig Dispense Refill  . amLODipine-valsartan (EXFORGE) 5-320 MG tablet TAKE 1 TABLET BY MOUTH DAILY. 90 tablet 3  .  aspirin 81 MG tablet Take 81 mg by mouth daily.    . calcium-vitamin D (OSCAL WITH D) 500-200 MG-UNIT per tablet Take 2 tablets by mouth every morning.     . chlorthalidone (HYGROTON) 25 MG tablet Take 1 tablet (25 mg total) by mouth daily. 90 tablet 3  . CINNAMON PO Take by mouth.    . hydrochlorothiazide (HYDRODIURIL) 25 MG tablet     . Multiple Vitamins-Minerals (MULTIVITAMIN GUMMIES ADULT PO) Take 2 each by mouth every morning.     No current facility-administered medications for this visit.     Allergies-reviewed and updated No Known Allergies  Social History   Social History  . Marital status: Married    Spouse name: N/A  . Number of children: 2  . Years of education: N/A   Occupational History  . Retired    Social History Main Topics  . Smoking status: Never Smoker  . Smokeless tobacco: Never Used  . Alcohol use 3.0 oz/week    5 Glasses of wine per week     Comment: daily  . Drug use: No  . Sexual activity: Yes   Other Topics Concern  . None   Social History Narrative   Married ([name]) 1965. 2 sons- 1 in [place]. 3 grandsons.  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 Oropharynx normal Neck: no thyromegaly CV: RRR no murmurs rubs or gallops Lungs: CTAB no crackles, wheeze, rhonchi Abdomen: soft/nontender/nondistended/normal bowel sounds. No rebound or guarding.  Ext: no edema Skin: warm, dry Neuro: grossly normal, moves all extremities, PERRLA No obvious confusion during discussion today  Assessment/Plan:  76 y.o. male presenting for annual physical.  Health Maintenance counseling: 1.  Anticipatory guidance: Patient counseled regarding regular dental exams q6 months, eye exams -yearly, wearing seatbelts.  2. Risk factor reduction:  Advised patient of need for regular exercise and diet rich and fruits and vegetables to reduce risk of heart attack and stroke. Exercise- at least 3x a week. Diet-weight stable to slightly - balanced diet.  Wt Readings from Last 3 Encounters:  10/17/17 194 lb (88 kg)  09/11/17 197 lb (89.4 kg)  04/15/17 196 lb (88.9 kg)  3. Immunizations/screenings/ancillary studies- needs 2nd shingrix  Immunization History  Administered Date(s) Administered  . Hepatitis A 08/11/2007  . Influenza Split 09/08/2012  . Influenza Whole 09/26/2008, 10/04/2009, 09/04/2010, 09/03/2011  . Influenza, High Dose Seasonal PF 10/06/2013, 11/08/2014, 10/15/2016, 09/11/2017  . Influenza,inj,Quad PF,6+ Mos 08/22/2015  . Pneumococcal Conjugate-13 12/08/2014  . Pneumococcal Polysaccharide-23 01/28/2006  . Td 01/28/2006  . Tdap 10/16/2016  . Zoster 12/08/2014  . Zoster Recombinat (Shingrix) 08/01/2017  4. Prostate cancer screening-  prior had slight increase in PSA, with age and dementia we agreed to not recheck. Sees Dr. Alinda Money for kidney stones- sees next week Lab Results  Component Value Date   PSA 1.84 09/21/2015   PSA 1.65 06/02/2014   PSA 1.50 06/01/2013   5. Colon cancer screening - due 2020. Last done 2015 6. Skin cancer screening- sees Dr. [place] Crane yearly with history melanoma and AKs  advised regular sunscreen use. Denies worrisome, changing, or new skin lesions. They did cryotherapy on spot he mentioned last week  Status of chronic or acute concerns   Clinical trials for MCI at [facility]- disease but not true dementia per their report. Wants to talk to [facility] and his wife "Does wake want [place] to go ahead and start aricept? If so, happy to order this for you at 5mg - then would want to recheck blood pressure in 2-3 weeks after starting. "  Sees [place] [facility] for left knee- synvisc  in past and again in september  Optho- glaucoma- not currently on drops  hyperglycemia 116 in 2017. And 119 this year. But a1c looks great Lab Results  Component Value Date   HGBA1C 5.3 10/09/2017   Essential hypertension S: controlled on amlodipine 5mg - valsartan 320mg , chlorthalidone25mg . Less tired since switching . Last few home checks were in 120s over 60s so has improved  BP Readings from Last 3 Encounters:  10/17/17 138/82  09/11/17 132/78  04/15/17 130/80  A/P: We discussed blood pressure goal of <140/90. Continue current meds. Will need to watch BP on aricept if started   Future Appointments Date Time Provider [place]  04/21/2018 1:00 PM [name], RN [facility]   6 months  Meds ordered this  encounter  Medications  . hydrochlorothiazide (HYDRODIURIL) 25 MG tablet    Return precautions advised.  Garret Reddish, MD

## 2017-10-17 NOTE — Patient Instructions (Addendum)
Does [facility] want [place] to go ahead and start aricept? If so, happy to order this for you at 5mg - then would want to recheck blood pressure in 2-3 weeks after starting.   Please check with your pharmacy to see if you had the 2nd shingrix shot and let us know dates of both shots.   6 months or sooner if we start the aricept

## 2017-10-27 ENCOUNTER — Other Ambulatory Visit: Payer: Self-pay

## 2017-10-27 ENCOUNTER — Encounter: Payer: Self-pay | Admitting: Family Medicine

## 2017-10-27 MED ORDER — DONEPEZIL HCL 5 MG PO TABS: 5.0000 mg | ORAL_TABLET | Freq: Every day | ORAL | 2 refills | Status: DC

## 2017-11-04 DIAGNOSIS — H524 Presbyopia: Secondary | ICD-10-CM | POA: Diagnosis not present

## 2017-11-04 DIAGNOSIS — N5201 Erectile dysfunction due to arterial insufficiency: Secondary | ICD-10-CM | POA: Diagnosis not present

## 2017-11-04 DIAGNOSIS — H40013 Open angle with borderline findings, low risk, bilateral: Secondary | ICD-10-CM | POA: Diagnosis not present

## 2017-11-04 DIAGNOSIS — Z961 Presence of intraocular lens: Secondary | ICD-10-CM | POA: Diagnosis not present

## 2017-11-04 DIAGNOSIS — N200 Calculus of kidney: Secondary | ICD-10-CM | POA: Diagnosis not present

## 2017-11-04 DIAGNOSIS — H40053 Ocular hypertension, bilateral: Secondary | ICD-10-CM | POA: Diagnosis not present

## 2017-12-03 ENCOUNTER — Other Ambulatory Visit: Payer: Self-pay | Admitting: Family Medicine

## 2017-12-25 ENCOUNTER — Encounter: Payer: Self-pay | Admitting: Family Medicine

## 2017-12-26 ENCOUNTER — Other Ambulatory Visit: Payer: Self-pay

## 2017-12-26 MED ORDER — AMLODIPINE BESYLATE 5 MG PO TABS: 5.0000 mg | ORAL_TABLET | Freq: Every day | ORAL | 3 refills | Status: DC

## 2017-12-26 MED ORDER — IRBESARTAN 300 MG PO TABS: 300.0000 mg | ORAL_TABLET | Freq: Every day | ORAL | 3 refills | Status: DC

## 2018-01-13 DIAGNOSIS — R69 Illness, unspecified: Secondary | ICD-10-CM | POA: Diagnosis not present

## 2018-01-19 ENCOUNTER — Encounter: Payer: Self-pay | Admitting: Family Medicine

## 2018-01-19 ENCOUNTER — Other Ambulatory Visit: Payer: Self-pay

## 2018-01-19 ENCOUNTER — Other Ambulatory Visit: Payer: Self-pay | Admitting: Family Medicine

## 2018-01-19 MED ORDER — DONEPEZIL HCL 5 MG PO TABS: 5.0000 mg | ORAL_TABLET | Freq: Every day | ORAL | 1 refills | Status: DC

## 2018-02-10 DIAGNOSIS — D0321 Melanoma in situ of right ear and external auricular canal: Secondary | ICD-10-CM | POA: Diagnosis not present

## 2018-02-10 DIAGNOSIS — Z08 Encounter for follow-up examination after completed treatment for malignant neoplasm: Secondary | ICD-10-CM | POA: Diagnosis not present

## 2018-02-10 DIAGNOSIS — Z1283 Encounter for screening for malignant neoplasm of skin: Secondary | ICD-10-CM | POA: Diagnosis not present

## 2018-02-10 DIAGNOSIS — D0339 Melanoma in situ of other parts of face: Secondary | ICD-10-CM | POA: Diagnosis not present

## 2018-02-10 DIAGNOSIS — R69 Illness, unspecified: Secondary | ICD-10-CM | POA: Diagnosis not present

## 2018-02-10 DIAGNOSIS — D485 Neoplasm of uncertain behavior of skin: Secondary | ICD-10-CM | POA: Diagnosis not present

## 2018-02-10 DIAGNOSIS — L570 Actinic keratosis: Secondary | ICD-10-CM | POA: Diagnosis not present

## 2018-02-10 DIAGNOSIS — Z85820 Personal history of malignant melanoma of skin: Secondary | ICD-10-CM | POA: Diagnosis not present

## 2018-02-10 DIAGNOSIS — D2262 Melanocytic nevi of left upper limb, including shoulder: Secondary | ICD-10-CM | POA: Diagnosis not present

## 2018-02-10 DIAGNOSIS — X32XXXD Exposure to sunlight, subsequent encounter: Secondary | ICD-10-CM | POA: Diagnosis not present

## 2018-02-10 DIAGNOSIS — C44311 Basal cell carcinoma of skin of nose: Secondary | ICD-10-CM | POA: Diagnosis not present

## 2018-02-18 DIAGNOSIS — D0339 Melanoma in situ of other parts of face: Secondary | ICD-10-CM | POA: Diagnosis not present

## 2018-02-18 DIAGNOSIS — L988 Other specified disorders of the skin and subcutaneous tissue: Secondary | ICD-10-CM | POA: Diagnosis not present

## 2018-03-23 DIAGNOSIS — F32A Depression, unspecified: Secondary | ICD-10-CM | POA: Insufficient documentation

## 2018-03-23 DIAGNOSIS — F329 Major depressive disorder, single episode, unspecified: Secondary | ICD-10-CM | POA: Insufficient documentation

## 2018-03-24 DIAGNOSIS — M1712 Unilateral primary osteoarthritis, left knee: Secondary | ICD-10-CM | POA: Diagnosis not present

## 2018-03-31 DIAGNOSIS — M1711 Unilateral primary osteoarthritis, right knee: Secondary | ICD-10-CM | POA: Diagnosis not present

## 2018-04-03 ENCOUNTER — Encounter: Payer: Self-pay | Admitting: Family Medicine

## 2018-04-03 ENCOUNTER — Other Ambulatory Visit: Payer: Self-pay | Admitting: Family Medicine

## 2018-04-07 DIAGNOSIS — M1712 Unilateral primary osteoarthritis, left knee: Secondary | ICD-10-CM | POA: Diagnosis not present

## 2018-04-13 DIAGNOSIS — R031 Nonspecific low blood-pressure reading: Secondary | ICD-10-CM | POA: Insufficient documentation

## 2018-04-17 ENCOUNTER — Ambulatory Visit: Payer: PPO | Admitting: Family Medicine

## 2018-04-17 ENCOUNTER — Ambulatory Visit: Payer: PPO | Admitting: *Deleted

## 2018-04-21 ENCOUNTER — Ambulatory Visit: Payer: PPO

## 2018-04-21 ENCOUNTER — Encounter: Payer: Self-pay | Admitting: Family Medicine

## 2018-04-21 ENCOUNTER — Ambulatory Visit: Payer: PPO | Admitting: *Deleted

## 2018-04-28 ENCOUNTER — Ambulatory Visit (INDEPENDENT_AMBULATORY_CARE_PROVIDER_SITE_OTHER): Payer: Medicare HMO | Admitting: Family Medicine

## 2018-04-28 ENCOUNTER — Encounter: Payer: Self-pay | Admitting: Family Medicine

## 2018-04-28 VITALS — BP 130/64 | HR 70 | Temp 97.6°F | Ht 71.0 in | Wt 198.2 lb

## 2018-04-28 DIAGNOSIS — M546 Pain in thoracic spine: Secondary | ICD-10-CM | POA: Diagnosis not present

## 2018-04-28 DIAGNOSIS — R4589 Other symptoms and signs involving emotional state: Secondary | ICD-10-CM

## 2018-04-28 DIAGNOSIS — R69 Illness, unspecified: Secondary | ICD-10-CM | POA: Diagnosis not present

## 2018-04-28 DIAGNOSIS — G8929 Other chronic pain: Secondary | ICD-10-CM

## 2018-04-28 DIAGNOSIS — I10 Essential (primary) hypertension: Secondary | ICD-10-CM | POA: Diagnosis not present

## 2018-04-28 DIAGNOSIS — F329 Major depressive disorder, single episode, unspecified: Secondary | ICD-10-CM

## 2018-04-28 NOTE — Assessment & Plan Note (Addendum)
S: controlled on amlodipine 5 mg--> 2.5mg , chlorthalidone 25 mg, irbesartan 300 mg.  They are concerned he may be taking an extra diuretic through urology.  They are concerned dehydration or low blood pressure could be causing his depressed mood.  They feel like these changes have occurred since changing from valsartan-amlodipine and HCTZ to now current regimen.  Amlodipine has been the only consistent medicine   Had a bad episode of dehydration in [place] when walking a lot- seemed to be walking off balance and quickly- they realized they werent drinking enough water.  BP Readings from Last 3 Encounters:  04/28/18 130/64  10/17/17 138/82  09/11/17 132/78  A/P: From AVS: "Lets tentatively plan to cut the chlorthalidone in half. This is dependent upon what the other medicine that he is taking in the morning is so please send me a mychart message about this."

## 2018-04-28 NOTE — Progress Notes (Signed)
Subjective:  Wesley Fuller is a 77 y.o. year old very pleasant male patient who presents for/with See problem oriented charting ROS- No chest pain or shortness of breath. No headache or blurry vision.  Has felt off balance at times.  Memory issues noted.  Polyuria noted.  Past Medical History-  Patient Active Problem List   Diagnosis Date Noted  . MCI (mild cognitive impairment) 08/22/2015    Priority: High  . Hyperglycemia 10/15/2016    Priority: Medium  . Essential hypertension 02/09/2008    Priority: Medium  . History of melanoma 02/09/2008    Priority: Medium  . Osteoarthritis of left knee 03/02/2014    Priority: Low  . VARICOSE VEINS LOWER EXTREMITIES W/INFLAMMATION 02/20/2010    Priority: Low  . GERD 11/21/2008    Priority: Low  . Actinic keratosis 10/12/2008    Priority: Low  . ERECTILE DYSFUNCTION 02/09/2008    Priority: Low  . RENAL CALCULUS, HX OF 02/09/2008    Priority: Low    Medications- reviewed and updated Current Outpatient Medications  Medication Sig Dispense Refill  . amLODipine (NORVASC) 5 MG tablet Take 1 tablet (5 mg total) by mouth daily. (Patient taking differently: Take 2.5 mg by mouth daily. ) 90 tablet 3  . calcium-vitamin D (OSCAL WITH D) 500-200 MG-UNIT per tablet Take 2 tablets by mouth every morning.     . chlorthalidone (HYGROTON) 25 MG tablet Take 1 tablet (25 mg total) by mouth daily. 90 tablet 3  . CINNAMON PO Take by mouth.    . donepezil (ARICEPT) 5 MG tablet Take 1 tablet (5 mg total) by mouth at bedtime. 90 tablet 1  . irbesartan (AVAPRO) 300 MG tablet Take 1 tablet (300 mg total) by mouth daily. 90 tablet 3  . Multiple Vitamins-Minerals (MULTIVITAMIN GUMMIES ADULT PO) Take 2 each by mouth every morning.     No current facility-administered medications for this visit.     Objective: BP 130/64 (BP Location: Left Arm, Patient Position: Sitting, Cuff Size: Normal)   Pulse 70   Temp 97.6 F (36.4 C) (Oral)   Ht 5\' 11"  (1.803 m)   Wt  198 lb 3.2 oz (89.9 kg)   SpO2 96%   BMI 27.64 kg/m  Gen: NAD, resting comfortably Does not appear dehydrated today CV: RRR no murmurs rubs or gallops Lungs: CTAB no crackles, wheeze, rhonchi Abdomen: soft/nontender/nondistended/normal bowel sounds.  Ext: no edema Skin: warm, dry Neuro: Normal gait and speech  Assessment/Plan:  Depressed mood S: Patient and wife are concerned about depression. Wife is concerned could be related to his blood pressure medicine.    He has been disappointed with [facility] falling through and he was enjoying that program. Plus brother has parkinsons and seems to be doing worse. PHQ9 of 9 with no SI. States he has had anhedonia and depressed mood.  A/P: Discussed potential therapy or SSRI like Zoloft.  He declined for now.  We will recheck in 2 weeks.  They prefer to make blood pressure medication adjustments   thoracic back pain S: admits to thoracic back pain for last 3-4 months up to 4/10.  A/P: Try silver sneakers and focus on core strengthening- if not improving we can consider x-ray or formal physical therapy  He may return to see [facility] with PT that they have used before for knee (has OA and getting injections) and core strengthening Essential hypertension S: controlled on amlodipine 5 mg--> 2.5mg , chlorthalidone 25 mg, irbesartan 300 mg.  They are concerned he may be taking an extra diuretic through urology.  They are concerned dehydration or low blood pressure could be causing his depressed mood.  They feel like these changes have occurred since changing from valsartan-amlodipine and HCTZ to now current regimen.  Amlodipine has been the only consistent medicine   Had a bad episode of dehydration in [place] when walking a lot- seemed to be walking off balance and quickly- they realized they werent drinking enough water.  BP Readings from Last 3 Encounters:  04/28/18 130/64  10/17/17 138/82  09/11/17 132/78  A/P: From AVS: "Lets  tentatively plan to cut the chlorthalidone in half. This is dependent upon what the other medicine that he is taking in the morning is so please send me a mychart message about this."   Future Appointments  Date Time Provider [place]  05/07/2018  3:00 PM Williemae Area, RN [facility]   2 weeks advised  Return precautions advised.  Garret Reddish, MD

## 2018-04-28 NOTE — Patient Instructions (Addendum)
Lets tentatively plan to cut the chlorthalidone in half. This is dependent upon what the other medicine that he is taking in the morning is so please send me a mychart message about this.   Try silver sneakers and focus on core strengthening- if not improving we can consider x-ray or formal physical therapy  Follow up in 2 weeks.

## 2018-05-06 NOTE — Progress Notes (Signed)
Subjective:   Wesley Fuller is a 77 y.o. male who presents for Medicare Annual/Subsequent preventive examination.  Reports health as OV 04/28/2018 States switched drugs at the end of the year States he was lethargic  Left knee bone on bone Brother dx with parkinson's  Also memory program cancelled at [facility] which he enjoyed  States his sadness is not as profound   Recheck for depression; brother has parkinson  Declined SSRI at last OV   Diet BMI 28.3  Breakfast 8 oz smoothie  Lunch sandwich; chips Dinner wife cooks  Exercise Has not been playing golf and has a tee time Monday Still does the exercise class Has personal trainer  to assist with working on core    There are no preventive care reminders to display for this patient.  Had his shingrix vaccine and went good  Cardiac Risk Factors include: advanced age (>54men, >15 women);family history of premature cardiovascular disease;hypertension;male gender   Stress;  Moderate Looking for retirement care  BP 140 70 today Brought in home reading 5/10 116/ 74; 131/70; 127/66 5/11 118 /60 5/12 130/69 5/13 119/63; 111/58;  5/14 116/64; 125/70    Objective:    Vitals: BP 140/70   Pulse 65   Ht 5\' 11"  (1.803 m)   Wt 203 lb (92.1 kg)   SpO2 97%   BMI 28.31 kg/m   Body mass index is 28.31 kg/m.  Advanced Directives 05/07/2018 04/15/2017 02/22/2015  Does Patient Have a Medical Advance Directive? Yes Yes Yes  Type of Advance Directive - - [place];Living will  Copy of [place] in Chart? - - No - copy requested   Reviewing for any changes   Tobacco Social History   Tobacco Use  Smoking Status Never Smoker  Smokeless Tobacco Never Used     Counseling given: Yes   Clinical Intake:   Past Medical History:  Diagnosis Date  . Allergy    seasonal  . Asthma    as a child  . Calcium oxalate renal stones   . Cataract 2013  . Diverticulosis   . ED (erectile  dysfunction)   . GERD (gastroesophageal reflux disease)   . Hypertension   . Melanoma of back ([place]) 1974  . Nail fungal infection   . Osteoarthritis of left knee 03/02/2014   Past Surgical History:  Procedure Laterality Date  . HERNIA REPAIR     x 2  . left leg stripping    . lymph node disection  1981  . melanoma removal of back   1974  . NASAL POLYP SURGERY    . skin cancer    . TONSILLECTOMY  1974   Family History  Problem Relation Age of Onset  . Colon cancer Father   . Colon cancer Paternal Uncle   . Colon cancer Paternal Uncle   . Hypertension Unknown    Social History   Socioeconomic History  . Marital status: Married    Spouse name: Not on file  . Number of children: 2  . Years of education: Not on file  . Highest education level: Not on file  Occupational History  . Occupation: Retired  Scientific laboratory technician  . Financial resource strain: Not on file  . Food insecurity:    Worry: Not on file    Inability: Not on file  . Transportation needs:    Medical: Not on file    Non-medical: Not on file  Tobacco Use  . Smoking status: Never  Smoker  . Smokeless tobacco: Never Used  Substance and Sexual Activity  . Alcohol use: Yes    Alcohol/week: 3.0 oz    Types: 5 Glasses of wine per week    Comment: does not drink very much; glass of wine every day  . Drug use: No  . Sexual activity: Yes  Lifestyle  . Physical activity:    Days per week: Not on file    Minutes per session: Not on file  . Stress: Not on file  Relationships  . Social connections:    Talks on phone: Not on file    Gets together: Not on file    Attends religious service: Not on file    Active member of club or organization: Not on file    Attends meetings of clubs or organizations: Not on file    Relationship status: Not on file  Other Topics Concern  . Not on file  Social History Narrative   Married ([name]) 38. 2 sons- 1 in [place]. 3 grandsons.       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 amLODipine (NORVASC) 5 MG tablet Take 1 tablet (5 mg total) by mouth daily. (Patient taking differently: Take 2.5 mg by mouth daily. )  . calcium-vitamin D (OSCAL WITH D) 500-200 MG-UNIT per tablet Take 2 tablets by mouth every morning.   . chlorthalidone (HYGROTON) 25 MG tablet Take 1 tablet (25 mg total) by mouth daily.  Marland Kitchen donepezil (ARICEPT) 5 MG tablet Take 1 tablet (5 mg total) by mouth at bedtime.  . irbesartan (AVAPRO) 300 MG tablet Take 1 tablet (300 mg total) by mouth daily.  Marland Kitchen CINNAMON PO Take by mouth.  . Multiple Vitamins-Minerals (MULTIVITAMIN GUMMIES ADULT PO) Take 2 each by mouth every morning.   No facility-administered encounter medications on file as of 05/07/2018.     Activities of Daily Living In your present state of health, do you have any difficulty performing the following activities: 05/07/2018  Hearing? (No Data)  Comment hearing aid on the left  Vision? N  Walking or climbing stairs? N  Dressing or bathing? N  Doing errands, shopping? N  Preparing Food and eating ? N  Using the Toilet? N  In the past six months, have you accidently leaked urine? N  Do you have problems with loss of bowel control? N  Managing your Medications? N  Managing your Finances? N  Housekeeping or managing your Housekeeping? N  Some recent data might be hidden    Patient Care Team: Marin Olp, MD as PCP - General (Family Medicine)   Assessment:   This is a routine wellness examination for Wesley Fuller.  Exercise Activities and Dietary recommendations Current Exercise Habits: Home exercise routine, Type of exercise: walking  Goals    . Patient Stated     Would like to collect information regarding long term plans  [email or web address]  [employer]; [phone] Pension scheme manager; Information regarding Long Term Care        Fall Risk Fall Risk  05/07/2018 04/28/2018 04/15/2017 03/14/2016 03/30/2015  Falls in the past year? No No No No No     Depression Screen PHQ 2/9 Scores 05/07/2018 04/28/2018 04/15/2017 03/14/2016  PHQ - 2 Score 0 3 0 0  PHQ- 9 Score - 9 - -   Interested in  LTC plans Accepting life as it is Making adjustments and tolerating the monitoring but overall enjoying life and making plans Outlook is brighter  Smiling today    Cognitive Function MMSE - Mini Mental State Exam 05/07/2018 04/15/2017  Not completed: (No Data) -  Orientation to time 5 5  Orientation to Place 5 5  Registration 3 3  Attention/ Calculation 5 4  Recall 2 3  Language- name 2 objects 2 2  Language- repeat 1 1  Language- follow 3 step command 3 3  Language- read & follow direction 1 1  Write a sentence 1 1  Copy design 1 1  Total score 29 29   Montreal Cognitive Assessment  11/29/2015  Visuospatial/ Executive (0/5) 5  Naming (0/3) 3  Attention: Read list of digits (0/2) 2  Attention: Read list of letters (0/1) 1  Attention: Serial 7 subtraction starting at 100 (0/3) 3  Language: Repeat phrase (0/2) 2  Language : Fluency (0/1) 1  Abstraction (0/2) 2  Delayed Recall (0/5) 2  Orientation (0/6) 6  Total 27  Adjusted Score (based on education) 27   6CIT Screen 04/15/2017  What Year? 0 points  What month? 0 points  What time? 0 points  Count back from 20 4 points  Months in reverse 2 points  Repeat phrase 2 points  Total Score 8    Immunization History  Administered Date(s) Administered  . Hepatitis A 08/11/2007  . Influenza Split 09/08/2012  . Influenza Whole 09/26/2008, 10/04/2009, 09/04/2010, 09/03/2011  . Influenza, High Dose Seasonal PF 10/06/2013, 11/08/2014, 10/15/2016, 09/11/2017  . Influenza,inj,Quad PF,6+ Mos 08/22/2015  . Pneumococcal Conjugate-13 12/08/2014  . Pneumococcal Polysaccharide-23 01/28/2006  . Td 01/28/2006  . Tdap 10/16/2016   . Zoster 12/08/2014  . Zoster Recombinat (Shingrix) 05/07/2017, 08/01/2017    Screening Tests Health Maintenance  Topic Date Due  . INFLUENZA VACCINE  07/23/2018  . COLONOSCOPY  02/10/2019  . TETANUS/TDAP  10/16/2026  . PNA vac Low Risk Adult  Completed        Plan:      PCP Notes   Health Maintenance There are no preventive care reminders to display for this patient.'  Abnormal Screens   BP was 140/70 but reading from home listed and were WNL   MMSE IMPROVED 29/30;  Serial 3 from 20 and was successful;  2/3 recall and used abbreviation tool for improved memory  Playing golf tomorrow with a friend and asked him to keep the score! Not has upset regarding his brother;  Passed clock test Discusses the fact he does not like the monitoring but is not going to let it ruin his day.  Discussing LTC plans and he and his wife are reviewing  Will send out [employer] resources for online caregiver and patient groups   Depression resolved for now; just got back form trip to [place] which he enjoyed and went to a play this past week.  States he feels better about his brother and is not as worried   Referrals  none  Patient concerns; None today    Nurse Concerns; As noted   Next PCP apt TBS        I have personally reviewed and noted the following in the patient's chart:   . Medical and social history . Use of alcohol, tobacco or illicit drugs  . Current medications and supplements . Functional ability and status . Nutritional status . Physical activity . Advanced directives . List of other physicians . Hospitalizations, surgeries,  and ER visits in previous 12 months . Vitals . Screenings to include cognitive, depression, and falls . Referrals and appointments  In addition, I have reviewed and discussed with patient certain preventive protocols, quality metrics, and best practice recommendations. A written personalized care plan for preventive services as well as  general preventive health recommendations were provided to patient.     [name], RN  05/07/2018

## 2018-05-07 ENCOUNTER — Ambulatory Visit (INDEPENDENT_AMBULATORY_CARE_PROVIDER_SITE_OTHER): Payer: Medicare HMO | Admitting: *Deleted

## 2018-05-07 ENCOUNTER — Encounter: Payer: Self-pay | Admitting: *Deleted

## 2018-05-07 VITALS — BP 140/70 | HR 65 | Ht 71.0 in | Wt 203.0 lb

## 2018-05-07 DIAGNOSIS — Z0000 Encounter for general adult medical examination without abnormal findings: Secondary | ICD-10-CM | POA: Diagnosis not present

## 2018-05-07 NOTE — Patient Instructions (Addendum)
Wesley Fuller , Thank you for taking time to come for your Medicare Wellness Visit. I appreciate your ongoing commitment to your health goals. Please review the following plan we discussed and let me know if I can assist you in the future.   These are the goals we discussed: Goals    . Patient Stated     Would like to collect information regarding long term plans  [email or web address]  [employer]; [phone] Management consultant; Information regarding Long Term Care        This is a list of the screening recommended for you and due dates:  Health Maintenance  Topic Date Due  . Flu Shot  07/23/2018  . Colon Cancer Screening  02/10/2019  . Tetanus Vaccine  10/16/2026  . Pneumonia vaccines  Completed      Fall Prevention in the Home Falls can cause injuries. They can happen to people of all ages. There are many things you can do to make your home safe and to help prevent falls. What can I do on the outside of my home?  Regularly fix the edges of walkways and driveways and fix any cracks.  Remove anything that might make you trip as you walk through a door, such as a raised step or threshold.  Trim any bushes or trees on the path to your home.  Use bright outdoor lighting.  Clear any walking paths of anything that might make someone trip, such as rocks or tools.  Regularly check to see if handrails are loose or broken. Make sure that both sides of any steps have handrails.  Any raised decks and porches should have guardrails on the edges.  Have any leaves, snow, or ice cleared regularly.  Use sand or salt on walking paths during winter.  Clean up any spills in your garage right away. This includes oil or grease spills. What can I do in the bathroom?  Use night lights.  Install grab bars by the toilet and in the tub and shower. Do not use towel bars as grab bars.  Use non-skid mats or decals in the tub or shower.  If you need to  sit down in the shower, use a plastic, non-slip stool.  Keep the floor dry. Clean up any water that spills on the floor as soon as it happens.  Remove soap buildup in the tub or shower regularly.  Attach bath mats securely with double-sided non-slip rug tape.  Do not have throw rugs and other things on the floor that can make you trip. What can I do in the bedroom?  Use night lights.  Make sure that you have a light by your bed that is easy to reach.  Do not use any sheets or blankets that are too big for your bed. They should not hang down onto the floor.  Have a firm chair that has side arms. You can use this for support while you get dressed.  Do not have throw rugs and other things on the floor that can make you trip. What can I do in the kitchen?  Clean up any spills right away.  Avoid walking on wet floors.  Keep items that you use a lot in easy-to-reach places.  If you need to reach something above you, use a strong step stool that has a grab bar.  Keep electrical cords out of the way.  Do not use floor polish or wax that makes floors slippery. If you must use wax, use  non-skid floor wax.  Do not have throw rugs and other things on the floor that can make you trip. What can I do with my stairs?  Do not leave any items on the stairs.  Make sure that there are handrails on both sides of the stairs and use them. Fix handrails that are broken or loose. Make sure that handrails are as long as the stairways.  Check any carpeting to make sure that it is firmly attached to the stairs. Fix any carpet that is loose or worn.  Avoid having throw rugs at the top or bottom of the stairs. If you do have throw rugs, attach them to the floor with carpet tape.  Make sure that you have a light switch at the top of the stairs and the bottom of the stairs. If you do not have them, ask someone to add them for you. What else can I do to help prevent falls?  Wear shoes that: ? Do not  have high heels. ? Have rubber bottoms. ? Are comfortable and fit you well. ? Are closed at the toe. Do not wear sandals.  If you use a stepladder: ? Make sure that it is fully opened. Do not climb a closed stepladder. ? Make sure that both sides of the stepladder are locked into place. ? Ask someone to hold it for you, if possible.  Clearly mark and make sure that you can see: ? Any grab bars or handrails. ? First and last steps. ? Where the edge of each step is.  Use tools that help you move around (mobility aids) if they are needed. These include: ? Canes. ? Walkers. ? Scooters. ? Crutches.  Turn on the lights when you go into a dark area. Replace any light bulbs as soon as they burn out.  Set up your furniture so you have a clear path. Avoid moving your furniture around.  If any of your floors are uneven, fix them.  If there are any pets around you, be aware of where they are.  Review your medicines with your doctor. Some medicines can make you feel dizzy. This can increase your chance of falling. Ask your doctor what other things that you can do to help prevent falls. This information is not intended to replace advice given to you by your health care provider. Make sure you discuss any questions you have with your health care provider. Document Released: 10/05/2009 Document Revised: 05/16/2016 Document Reviewed: 01/13/2015 Elsevier Interactive Patient Education  2018 [place] Maintenance, Male A healthy lifestyle and preventive care is important for your health and wellness. Ask your health care provider about what schedule of regular examinations is right for you. What should I know about weight and diet? Eat a Healthy Diet  Eat plenty of vegetables, fruits, whole grains, low-fat dairy products, and lean protein.  Do not eat a lot of foods high in solid fats, added sugars, or salt.  Maintain a Healthy Weight Regular exercise can help you achieve or  maintain a healthy weight. You should:  Do at least 150 minutes of exercise each week. The exercise should increase your heart rate and make you sweat (moderate-intensity exercise).  Do strength-training exercises at least twice a week.  Watch Your Levels of Cholesterol and Blood Lipids  Have your blood tested for lipids and cholesterol every 5 years starting at 77 years of age. If you are at high risk for heart disease, you should start having your  blood tested when you are 77 years old. You may need to have your cholesterol levels checked more often if: ? Your lipid or cholesterol levels are high. ? You are older than 77 years of age. ? You are at high risk for heart disease.  What should I know about cancer screening? Many types of cancers can be detected early and may often be prevented. Lung Cancer  You should be screened every year for lung cancer if: ? You are a current smoker who has smoked for at least 30 years. ? You are a former smoker who has quit within the past 15 years.  Talk to your health care provider about your screening options, when you should start screening, and how often you should be screened.  Colorectal Cancer  Routine colorectal cancer screening usually begins at 77 years of age and should be repeated every 5-10 years until you are 77 years old. You may need to be screened more often if early forms of precancerous polyps or small growths are found. Your health care provider may recommend screening at an earlier age if you have risk factors for colon cancer.  Your health care provider may recommend using home test kits to check for hidden blood in the stool.  A small camera at the end of a tube can be used to examine your colon (sigmoidoscopy or colonoscopy). This checks for the earliest forms of colorectal cancer.  Prostate and Testicular Cancer  Depending on your age and overall health, your health care provider may do certain tests to screen for prostate  and testicular cancer.  Talk to your health care provider about any symptoms or concerns you have about testicular or prostate cancer.  Skin Cancer  Check your skin from head to toe regularly.  Tell your health care provider about any new moles or changes in moles, especially if: ? There is a change in a mole's size, shape, or color. ? You have a mole that is larger than a pencil eraser.  Always use sunscreen. Apply sunscreen liberally and repeat throughout the day.  Protect yourself by wearing long sleeves, pants, a wide-brimmed hat, and sunglasses when outside.  What should I know about heart disease, diabetes, and high blood pressure?  If you are 98-92 years of age, have your blood pressure checked every 3-5 years. If you are 35 years of age or older, have your blood pressure checked every year. You should have your blood pressure measured twice-once when you are at a hospital or clinic, and once when you are not at a hospital or clinic. Record the average of the two measurements. To check your blood pressure when you are not at a hospital or clinic, you can use: ? An automated blood pressure machine at a pharmacy. ? A home blood pressure monitor.  Talk to your health care provider about your target blood pressure.  If you are between 66-47 years old, ask your health care provider if you should take aspirin to prevent heart disease.  Have regular diabetes screenings by checking your fasting blood sugar level. ? If you are at a normal weight and have a low risk for diabetes, have this test once every three years after the age of 25. ? If you are overweight and have a high risk for diabetes, consider being tested at a younger age or more often.  A one-time screening for abdominal aortic aneurysm (AAA) by ultrasound is recommended for men aged 53-75 years who are current or  former smokers. What should I know about preventing infection? Hepatitis B If you have a higher risk for  hepatitis B, you should be screened for this virus. Talk with your health care provider to find out if you are at risk for hepatitis B infection. Hepatitis C Blood testing is recommended for:  Everyone born from [birth year] through 1965.  Anyone with known risk factors for hepatitis C.  Sexually Transmitted Diseases (STDs)  You should be screened each year for STDs including gonorrhea and chlamydia if: ? You are sexually active and are younger than 77 years of age. ? You are older than 77 years of age and your health care provider tells you that you are at risk for this type of infection. ? Your sexual activity has changed since you were last screened and you are at an increased risk for chlamydia or gonorrhea. Ask your health care provider if you are at risk.  Talk with your health care provider about whether you are at high risk of being infected with HIV. Your health care provider may recommend a prescription medicine to help prevent HIV infection.  What else can I do?  Schedule regular health, dental, and eye exams.  Stay current with your vaccines (immunizations).  Do not use any tobacco products, such as cigarettes, chewing tobacco, and e-cigarettes. If you need help quitting, ask your health care provider.  Limit alcohol intake to no more than 2 drinks per day. One drink equals 12 ounces of beer, 5 ounces of wine, or 1 ounces of hard liquor.  Do not use street drugs.  Do not share needles.  Ask your health care provider for help if you need support or information about quitting drugs.  Tell your health care provider if you often feel depressed.  Tell your health care provider if you have ever been abused or do not feel safe at home. This information is not intended to replace advice given to you by your health care provider. Make sure you discuss any questions you have with your health care provider. Document Released: 06/06/2008 Document Revised: 08/07/2016 Document Reviewed:  09/12/2015 Elsevier Interactive Patient Education  [employer].

## 2018-05-07 NOTE — Progress Notes (Signed)
I have reviewed and agree with note, evaluation, plan.   Skylor Hughson, MD  

## 2018-05-13 ENCOUNTER — Telehealth: Payer: Self-pay

## 2018-05-13 DIAGNOSIS — H40053 Ocular hypertension, bilateral: Secondary | ICD-10-CM | POA: Diagnosis not present

## 2018-05-13 DIAGNOSIS — H40013 Open angle with borderline findings, low risk, bilateral: Secondary | ICD-10-CM | POA: Diagnosis not present

## 2018-05-13 NOTE — Telephone Encounter (Signed)
Information sent out for memory loss connection groups online as well as the memory cafe at [facility] [place] .   This information was requested at Mr. Wesley Fuller on May 16 [name] RN

## 2018-05-25 ENCOUNTER — Encounter: Payer: Self-pay | Admitting: Family Medicine

## 2018-05-26 ENCOUNTER — Other Ambulatory Visit: Payer: Self-pay

## 2018-05-26 MED ORDER — IRBESARTAN 300 MG PO TABS: 150.0000 mg | ORAL_TABLET | Freq: Every day | ORAL | 0 refills | Status: DC

## 2018-06-01 ENCOUNTER — Ambulatory Visit: Payer: Medicare HMO | Admitting: Family Medicine

## 2018-06-01 ENCOUNTER — Encounter: Payer: Self-pay | Admitting: Family Medicine

## 2018-06-01 ENCOUNTER — Ambulatory Visit: Payer: Self-pay | Admitting: Family Medicine

## 2018-06-01 VITALS — BP 162/80 | HR 58 | Temp 97.7°F | Ht 71.0 in | Wt 198.4 lb

## 2018-06-01 DIAGNOSIS — I10 Essential (primary) hypertension: Secondary | ICD-10-CM

## 2018-06-01 DIAGNOSIS — R4589 Other symptoms and signs involving emotional state: Secondary | ICD-10-CM

## 2018-06-01 DIAGNOSIS — R69 Illness, unspecified: Secondary | ICD-10-CM | POA: Diagnosis not present

## 2018-06-01 DIAGNOSIS — F329 Major depressive disorder, single episode, unspecified: Secondary | ICD-10-CM

## 2018-06-01 DIAGNOSIS — F320 Major depressive disorder, single episode, mild: Secondary | ICD-10-CM | POA: Insufficient documentation

## 2018-06-01 MED ORDER — HYDROCHLOROTHIAZIDE 12.5 MG PO TABS: 12.5000 mg | ORAL_TABLET | Freq: Every day | ORAL | 3 refills | Status: DC

## 2018-06-01 MED ORDER — IRBESARTAN 300 MG PO TABS: 300.0000 mg | ORAL_TABLET | Freq: Every day | ORAL | 3 refills | Status: DC

## 2018-06-01 MED ORDER — ESCITALOPRAM OXALATE 5 MG PO TABS: 5.0000 mg | ORAL_TABLET | Freq: Every day | ORAL | 3 refills | Status: DC

## 2018-06-01 NOTE — Patient Instructions (Signed)
Come back for labs- schedule a visit either before you leave or can call in tomorrow to schedule labs  Start lexapro/escitalopram 5 mg. See me back in 6 weeks. If you feel medication not fitting well or side effects too strong- see me back sooner.   Taking the medicine as directed and not missing any doses is one of the best things you can do to treat your depression.  Here are some things to keep in mind:  1) Side effects (stomach upset, some increased anxiety) may happen before you notice a benefit.  These side effects typically go away over time. 2) Changes to your dose of medicine or a change in medication all together is sometimes necessary 3) Most people need to be on medication at least 6-12 months 4) Many people will notice an improvement within two weeks but the full effect of the medication can take up to 4-6 weeks 5) Stopping the medication when you start feeling better often results in a return of symptoms 6) If you start having thoughts of hurting yourself or others after starting this medicine, call our office immediately at [phone] or seek care through 911.

## 2018-06-01 NOTE — Assessment & Plan Note (Signed)
S: controlled poorly on amlodipine 5 mg, HCTZ 12.5 mg, irbesartan 150 mg BP Readings from Last 3 Encounters:  06/01/18 (!) 162/80  05/07/18 140/70  04/28/18 130/64  A/P: We discussed blood pressure goal of <140/90. Continue current meds:  But increase irbesartan back to 300mg  and follow up in 6 weeks. Home #s reportedly 120s but decreasing irbesartan didn't improve fatigue so they are ok with trialing higher dose and wonder about accuracy of home readings.

## 2018-06-01 NOTE — Telephone Encounter (Signed)
Feels  Anxious at times - Pt states sleeping well bp  Has  running approx  119/60  taken yesterday  and readings have been running close to that as well recently. Patient denies any harmfull feelings. Appointment made for today by the Agent with Dr Yong Channel ok by the practice. Patient was advised to call back if any new symptoms or problems.   Reason for Disposition . [1] MILD weakness (i.e., does not interfere with ability to work, go to school, normal activities) AND [2] persists > 1 week  Answer Assessment - Initial Assessment Questions 1. DESCRIPTION: "Describe how you are feeling."       Weak -  Decreased  Energy   -  Chronic  Pain l knee    2. SEVERITY: "How bad is it?"  "Can you stand and walk?"   - MILD - Feels weak or tired, but does not interfere with work, school or normal activities   - [place] to stand and walk; weakness interferes with work, school, or normal activities   - SEVERE - Unable to stand or walk     Mild   3. ONSET:  "When did the weakness begin?"       4-5 months  4. CAUSE: "What do you think is causing the weakness?"       Medication change    5. MEDICINES: "Have you recently started a new medicine or had a change in the amount of a medicine?"        Not recently   6. OTHER SYMPTOMS: "Do you have any other symptoms?" (e.g., chest pain, fever, cough, SOB, vomiting, diarrhea, bleeding)       Not   7. PREGNANCY: "Is there any chance you are pregnant?" "When was your last menstrual period?"     n/a  Protocols used: WEAKNESS (GENERALIZED) AND FATIGUE-A-AH

## 2018-06-01 NOTE — Assessment & Plan Note (Addendum)
S: brother still not doing well. Still sad about wake alzheimers trial being shut down. Has felt down since at least January. Several funerals lately. still feeling malaise/ just feeling down/not like himself Depression screen PHQ 2/9 06/01/2018  Decreased Interest 1  Down, Depressed, Hopeless 1  PHQ - 2 Score 2  Altered sleeping 0  Tired, decreased energy 2  Change in appetite 0  Feeling bad or failure about yourself  0  Trouble concentrating 1  Moving slowly or fidgety/restless 0  Suicidal thoughts 0  PHQ-9 Score 5  Difficult doing work/chores Not difficult at all  A/P: will update cbc, cmp, tsh given malaise. Suspect depression as the cause- will start lexapro 5 mg. Recheck 6 weeks. Went over potential risks of medicine. I think the PHQ9 is likely underreported honestly at 5.

## 2018-06-01 NOTE — Progress Notes (Signed)
Subjective:  Wesley Fuller is a 77 y.o. year old very pleasant male patient who presents for/with See problem oriented charting ROS-malaise noted.  No chest pain or shortness of breath.  No fever or chills.  Admits to some depressed mood and anhedonia  Past Medical History-  Patient Active Problem List   Diagnosis Date Noted  . MCI (mild cognitive impairment) 08/22/2015    Priority: High  . Depression, major, single episode, mild ([place]) 06/01/2018    Priority: Medium  . Hyperglycemia 10/15/2016    Priority: Medium  . Essential hypertension 02/09/2008    Priority: Medium  . History of melanoma 02/09/2008    Priority: Medium  . Osteoarthritis of left knee 03/02/2014    Priority: Low  . VARICOSE VEINS LOWER EXTREMITIES W/INFLAMMATION 02/20/2010    Priority: Low  . GERD 11/21/2008    Priority: Low  . Actinic keratosis 10/12/2008    Priority: Low  . ERECTILE DYSFUNCTION 02/09/2008    Priority: Low  . RENAL CALCULUS, HX OF 02/09/2008    Priority: Low    Medications- reviewed and updated Current Outpatient Medications  Medication Sig Dispense Refill  . amLODipine (NORVASC) 5 MG tablet Take 1 tablet (5 mg total) by mouth daily. (Patient taking differently: Take 2.5 mg by mouth daily. ) 90 tablet 3  . calcium-vitamin D (OSCAL WITH D) 500-200 MG-UNIT per tablet Take 2 tablets by mouth every morning.     Marland Kitchen CINNAMON PO Take by mouth.    . donepezil (ARICEPT) 5 MG tablet Take 1 tablet (5 mg total) by mouth at bedtime. 90 tablet 1  . hydroCHLOROthiazide (MICROZIDE PO) Take 15 mg by mouth daily.    . irbesartan (AVAPRO) 300 MG tablet Take 0.5 tablets (150 mg total) by mouth daily. 90 tablet 0  . Multiple Vitamins-Minerals (MULTIVITAMIN GUMMIES ADULT PO) Take 2 each by mouth every morning.     No current facility-administered medications for this visit.     Objective: BP (!) 162/80 (BP Location: Left Arm, Cuff Size: Large)   Pulse (!) 58   Temp 97.7 F (36.5 C) (Oral)   Ht 5\' 11"   (1.803 m)   Wt 198 lb 6.4 oz (90 kg)   SpO2 96%   BMI 27.67 kg/m  Gen: NAD, resting comfortably CV: RRR no murmurs rubs or gallops Lungs: CTAB no crackles, wheeze, rhonchi Abdomen: soft/nontender/nondistended/normal bowel sounds.  Ext: no edema Skin: warm, dry Neuro: Some forgetfulness noted  EKG- sinus rhythm with rate of 60, normal axis, normal intervals, no hypertrophy, no significant ST or T wave changes.  Low voltage in lead III and aVl.    EKG done to evaluate for prolonged QT interval and none detected  Assessment/Plan:  Other notes: 1. mentions left knee pain- apparently bothering him 4 years- going on cruise and august and wants to make sure to get around. Dr. Theda Sers PA did gel shot recently - cant have next one until October- he is going to call his orthopedist to follow up before cruise.   Essential hypertension S: controlled poorly on amlodipine 5 mg, HCTZ 12.5 mg, irbesartan 150 mg BP Readings from Last 3 Encounters:  06/01/18 (!) 162/80  05/07/18 140/70  04/28/18 130/64  A/P: We discussed blood pressure goal of <140/90. Continue current meds:  But increase irbesartan back to 300mg  and follow up in 6 weeks. Home #s reportedly 120s but decreasing irbesartan didn't improve fatigue so they are ok with trialing higher dose and wonder about accuracy of home readings.  Depression, major, single episode, mild (Wesley Fuller) S: brother still not doing well. Still sad about wake alzheimers trial being shut down. Has felt down since at least January. Several funerals lately. still feeling malaise/ just feeling down/not like himself Depression screen PHQ 2/9 06/01/2018  Decreased Interest 1  Down, Depressed, Hopeless 1  PHQ - 2 Score 2  Altered sleeping 0  Tired, decreased energy 2  Change in appetite 0  Feeling bad or failure about yourself  0  Trouble concentrating 1  Moving slowly or fidgety/restless 0  Suicidal thoughts 0  PHQ-9 Score 5  Difficult doing work/chores Not  difficult at all  A/P: will update cbc, cmp, tsh given malaise. Suspect depression as the cause- will start lexapro 5 mg. Recheck 6 weeks. Went over potential risks of medicine. I think the PHQ9 is likely underreported honestly at 5.    Future Appointments  Date Time Provider [place]  06/03/2018 11:00 AM [facility]   Advised 6-week follow-up  Lab/Order associations: Hypertension, unspecified type - Plan: EKG 12-Lead  Depressed mood - Plan: CBC, Comprehensive metabolic panel, TSH  Meds ordered this encounter  Medications  . escitalopram (LEXAPRO) 5 MG tablet    Sig: Take 1 tablet (5 mg total) by mouth daily.    Dispense:  30 tablet    Refill:  3  . hydrochlorothiazide (HYDRODIURIL) 12.5 MG tablet    Sig: Take 1 tablet (12.5 mg total) by mouth daily.    Dispense:  90 tablet    Refill:  3  . irbesartan (AVAPRO) 300 MG tablet    Sig: Take 1 tablet (300 mg total) by mouth daily.    Dispense:  90 tablet    Refill:  3   Return precautions advised.  Garret Reddish, MD

## 2018-06-02 DIAGNOSIS — D225 Melanocytic nevi of trunk: Secondary | ICD-10-CM | POA: Diagnosis not present

## 2018-06-02 DIAGNOSIS — Z08 Encounter for follow-up examination after completed treatment for malignant neoplasm: Secondary | ICD-10-CM | POA: Diagnosis not present

## 2018-06-02 DIAGNOSIS — Z85820 Personal history of malignant melanoma of skin: Secondary | ICD-10-CM | POA: Diagnosis not present

## 2018-06-02 DIAGNOSIS — Z1283 Encounter for screening for malignant neoplasm of skin: Secondary | ICD-10-CM | POA: Diagnosis not present

## 2018-06-03 ENCOUNTER — Other Ambulatory Visit (INDEPENDENT_AMBULATORY_CARE_PROVIDER_SITE_OTHER): Payer: Medicare HMO

## 2018-06-03 DIAGNOSIS — F329 Major depressive disorder, single episode, unspecified: Secondary | ICD-10-CM | POA: Diagnosis not present

## 2018-06-03 DIAGNOSIS — R69 Illness, unspecified: Secondary | ICD-10-CM | POA: Diagnosis not present

## 2018-06-03 DIAGNOSIS — R4589 Other symptoms and signs involving emotional state: Secondary | ICD-10-CM

## 2018-06-03 LAB — COMPREHENSIVE METABOLIC PANEL
ALBUMIN: 4.1 g/dL (ref 3.5–5.2)
ALT: 20 U/L (ref 0–53)
AST: 30 U/L (ref 0–37)
Alkaline Phosphatase: 55 U/L (ref 39–117)
BUN: 16 mg/dL (ref 6–23)
CALCIUM: 9.5 mg/dL (ref 8.4–10.5)
CHLORIDE: 105 meq/L (ref 96–112)
CO2: 27 mEq/L (ref 19–32)
Creatinine, Ser: 0.93 mg/dL (ref 0.40–1.50)
GFR: 83.65 mL/min (ref >60.00)
Glucose, Bld: 111 mg/dL — ABNORMAL HIGH (ref 70–99)
POTASSIUM: 3.9 meq/L (ref 3.5–5.1)
Sodium: 140 mEq/L (ref 135–145)
Total Bilirubin: 1.5 mg/dL — ABNORMAL HIGH (ref 0.2–1.2)
Total Protein: 6.3 g/dL (ref 6.0–8.3)

## 2018-06-03 LAB — CBC
HEMATOCRIT: 47.4 % (ref 39.0–52.0)
HEMOGLOBIN: 16.5 g/dL (ref 13.0–17.0)
MCHC: 34.9 g/dL (ref 30.0–36.0)
MCV: 94.5 fl (ref 78.0–100.0)
Platelets: 183 10*3/uL (ref 150.0–400.0)
RBC: 5.01 Mil/uL (ref 4.22–5.81)
RDW: 12.9 % (ref 11.5–15.5)
WBC: 7.1 10*3/uL (ref 4.0–10.5)

## 2018-06-03 LAB — TSH: TSH: 2.05 u[IU]/mL (ref 0.35–4.50)

## 2018-07-16 ENCOUNTER — Encounter: Payer: Self-pay | Admitting: Family Medicine

## 2018-07-17 NOTE — Telephone Encounter (Signed)
With Dr. Yong Channel out next week, how should we schedule the patient? Please advise on if you would like her to see another provider or wait until Dr. Yong Channel comes back.

## 2018-07-18 ENCOUNTER — Encounter: Payer: Self-pay | Admitting: Family Medicine

## 2018-07-19 ENCOUNTER — Encounter: Payer: Self-pay | Admitting: Family Medicine

## 2018-07-20 ENCOUNTER — Other Ambulatory Visit: Payer: Self-pay

## 2018-07-20 DIAGNOSIS — M1712 Unilateral primary osteoarthritis, left knee: Secondary | ICD-10-CM | POA: Diagnosis not present

## 2018-07-20 MED ORDER — DONEPEZIL HCL 5 MG PO TABS: 5.0000 mg | ORAL_TABLET | Freq: Every day | ORAL | 1 refills | Status: DC

## 2018-07-27 ENCOUNTER — Encounter: Payer: Self-pay | Admitting: Family Medicine

## 2018-07-31 ENCOUNTER — Ambulatory Visit: Payer: Self-pay

## 2018-07-31 ENCOUNTER — Ambulatory Visit (INDEPENDENT_AMBULATORY_CARE_PROVIDER_SITE_OTHER): Payer: Medicare HMO | Admitting: Physician Assistant

## 2018-07-31 ENCOUNTER — Encounter: Payer: Self-pay | Admitting: Physician Assistant

## 2018-07-31 VITALS — BP 124/78 | HR 76 | Temp 97.9°F | Ht 71.0 in | Wt 192.6 lb

## 2018-07-31 DIAGNOSIS — R42 Dizziness and giddiness: Secondary | ICD-10-CM | POA: Diagnosis not present

## 2018-07-31 DIAGNOSIS — I4891 Unspecified atrial fibrillation: Secondary | ICD-10-CM | POA: Diagnosis not present

## 2018-07-31 MED ORDER — METOPROLOL SUCCINATE ER 25 MG PO TB24: 25.0000 mg | ORAL_TABLET | Freq: Every day | ORAL | 3 refills | Status: DC

## 2018-07-31 MED ORDER — RIVAROXABAN 20 MG PO TABS
20.0000 mg | ORAL_TABLET | Freq: Every day | ORAL | 1 refills | Status: DC
Start: 2018-07-31 — End: 2018-08-31

## 2018-07-31 NOTE — Progress Notes (Signed)
Wesley Fuller is a 77 y.o. male here for a new problem.  History of Present Illness:   Chief Complaint  Patient presents with  . Acute Visit    HPI   Patient present with wife.  Before going to the gym, he had a smoothie. Did not have water. Was at the gym at 9:30 am working with personal trainer for his shoulder. After done walking on the track, he felt lightheaded and off balance, like he might pass out. He drank water, had something to eat and had jelly beans. Felt better after this.  Denies any changes in urination. Denies chest pain, SOB, changes in vision, diaphoresis.  No prior history of cardiac issues, except for HTN. Currently on Norvasc 5 mg and Ibesartan 300 mg daily.  CHADS2VASC = 3  EKG in June 2019 was NSR.  Past Medical History:  Diagnosis Date  . Allergy    seasonal  . Asthma    as a child  . Calcium oxalate renal stones   . Cataract 2013  . Diverticulosis   . ED (erectile dysfunction)   . GERD (gastroesophageal reflux disease)   . Hypertension   . Melanoma of back ([place]) 1974  . Nail fungal infection   . Osteoarthritis of left knee 03/02/2014     Social History   Socioeconomic History  . Marital status: Married    Spouse name: Not on file  . Number of children: 2  . Years of education: Not on file  . Highest education level: Not on file  Occupational History  . Occupation: Retired  Scientific laboratory technician  . Financial resource strain: Not on file  . Food insecurity:    Worry: Not on file    Inability: Not on file  . Transportation needs:    Medical: Not on file    Non-medical: Not on file  Tobacco Use  . Smoking status: Never Smoker  . Smokeless tobacco: Never Used  Substance and Sexual Activity  . Alcohol use: Yes    Alcohol/week: 5.0 standard drinks    Types: 5 Glasses of wine per week    Comment: does not drink very much; glass of wine every day  . Drug use: No  . Sexual activity: Yes  Lifestyle  . Physical activity:    Days per week: Not  on file    Minutes per session: Not on file  . Stress: Not on file  Relationships  . Social connections:    Talks on phone: Not on file    Gets together: Not on file    Attends religious service: Not on file    Active member of club or organization: Not on file    Attends meetings of clubs or organizations: Not on file    Relationship status: Not on file  . Intimate partner violence:    Fear of current or ex partner: Not on file    Emotionally abused: Not on file    Physically abused: Not on file    Forced sexual activity: Not on file  Other Topics Concern  . Not on file  Social History Narrative   Married ([name]) 30. 2 sons- 1 in [place]. 3 grandsons.       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 HERNIA REPAIR     x 2  . left leg stripping    .  lymph node disection  1981  . melanoma removal of back   1974  . NASAL POLYP SURGERY    . skin cancer    . TONSILLECTOMY  1974    Family History  Problem Relation Age of Onset  . Colon cancer Father   . Colon cancer Paternal Uncle   . Colon cancer Paternal Uncle   . Hypertension Unknown     No Known Allergies  Current Medications:   Current Outpatient Medications:  .  amLODipine (NORVASC) 5 MG tablet, Take 1 tablet (5 mg total) by mouth daily. (Patient taking differently: Take 2.5 mg by mouth daily. ), Disp: 90 tablet, Rfl: 3 .  calcium-vitamin D (OSCAL WITH D) 500-200 MG-UNIT per tablet, Take 2 tablets by mouth every morning. , Disp: , Rfl:  .  donepezil (ARICEPT) 5 MG tablet, Take 1 tablet (5 mg total) by mouth at bedtime., Disp: 90 tablet, Rfl: 1 .  hydrochlorothiazide (HYDRODIURIL) 12.5 MG tablet, Take 1 tablet (12.5 mg total) by mouth daily., Disp: 90 tablet, Rfl: 3 .  irbesartan (AVAPRO) 300 MG tablet, Take 1 tablet (300 mg total) by mouth daily., Disp: 90 tablet, Rfl:  3 .  metoprolol succinate (TOPROL-XL) 25 MG 24 hr tablet, Take 1 tablet (25 mg total) by mouth daily., Disp: 90 tablet, Rfl: 3 .  rivaroxaban (XARELTO) 20 MG TABS tablet, Take 1 tablet (20 mg total) by mouth daily with supper., Disp: 30 tablet, Rfl: 1   Review of Systems:   ROS Negative unless otherwise specified per HPI.  Vitals:   Vitals:   07/31/18 1236  BP: 124/78  Pulse: 76  Temp: 97.9 F (36.6 C)  TempSrc: Oral  SpO2: 96%  Weight: 192 lb 9.6 oz (87.4 kg)  Height: 5\' 11"  (1.803 m)     Body mass index is 26.86 kg/m.  Physical Exam:   Physical Exam  Constitutional: He appears well-developed. He is cooperative.  Non-toxic appearance. He does not have a sickly appearance. He does not appear ill. No distress.  Cardiovascular: Normal rate, S1 normal, S2 normal, normal heart sounds and normal pulses. An irregularly irregular rhythm present.  No LE edema  Pulmonary/Chest: Effort normal and breath sounds normal.  Neurological: He is alert. He has normal strength. No cranial nerve deficit or sensory deficit. Coordination and gait normal. GCS eye subscore is 4. GCS verbal subscore is 5. GCS motor subscore is 6.  Skin: Skin is warm, dry and intact.  Psychiatric: He has a normal mood and affect. His speech is normal and behavior is normal.  Nursing note and vitals reviewed.  EKG with atrial fibrillation  Assessment and Plan:    Magic was seen today for acute visit.  Diagnoses and all orders for this visit:  Lightheadedness; Atrial fibrillation, unspecified type ([place]) New onset A-fib revealed on EKG. Reviewed plan of care with PCP, Dr. Garret Reddish.  CHADSVASC2 score is 3 today.  Will start Xarelto 20 mg daily.  Start metoprolol 25 mg extended release daily.  We will do an urgent cardiology referral, patient is going to be traveling out of the country on Friday of next week for 2 weeks.  Answered questions with both patient and wife.  Recommended avoiding strenuous activity.   If any chest pain, shortness of breath, or other concerning symptoms develop I recommended patient go to the urgency department for further evaluation and treatment.  Patient and wife verbalized understanding to plan.  -     EKG 12-Lead -  Ambulatory referral to Cardiology   Other orders -     metoprolol succinate (TOPROL-XL) 25 MG 24 hr tablet; Take 1 tablet (25 mg total) by mouth daily. -     rivaroxaban (XARELTO) 20 MG TABS tablet; Take 1 tablet (20 mg total) by mouth daily with supper.   . Reviewed expectations re: course of current medical issues. . Discussed self-management of symptoms. . Outlined signs and symptoms indicating need for more acute intervention. . Patient verbalized understanding and all questions were answered. . See orders for this visit as documented in the electronic medical record. . Patient received an After-Visit Summary.   CMA or LPN served as scribe during this visit. History, Physical, and Plan performed by medical provider. Documentation and orders reviewed and attested to.  Inda Coke, PA-C

## 2018-07-31 NOTE — Telephone Encounter (Signed)
See note

## 2018-07-31 NOTE — Telephone Encounter (Signed)
noted 

## 2018-07-31 NOTE — Patient Instructions (Signed)
It was great to see you!  Start the metroprolol and xarelto. You will be contacted about your referral to cardiology  Let's follow-up with Dr. Yong Channel if you have concerns.  Take care,  Inda Coke PA-C   Atrial Fibrillation Atrial fibrillation is a type of irregular or rapid heartbeat (arrhythmia). In atrial fibrillation, the heart quivers continuously in a chaotic pattern. This occurs when parts of the heart receive disorganized signals that make the heart unable to pump blood normally. This can increase the risk for stroke, heart failure, and other heart-related conditions. There are different types of atrial fibrillation, including:  Paroxysmal atrial fibrillation. This type starts suddenly, and it usually stops on its own shortly after it starts.  Persistent atrial fibrillation. This type often lasts longer than a week. It may stop on its own or with treatment.  Long-lasting persistent atrial fibrillation. This type lasts longer than 12 months.  Permanent atrial fibrillation. This type does not go away.  Talk with your health care provider to learn about the type of atrial fibrillation that you have. What are the causes? This condition is caused by some heart-related conditions or procedures, including:  A heart attack.  Coronary artery disease.  Heart failure.  Heart valve conditions.  High blood pressure.  Inflammation of the sac that surrounds the heart (pericarditis).  Heart surgery.  Certain heart rhythm disorders, such as Wolf-Parkinson-White syndrome.  Other causes include:  Pneumonia.  Obstructive sleep apnea.  Blockage of an artery in the lungs (pulmonary embolism, or PE).  Lung cancer.  Chronic lung disease.  Thyroid problems, especially if the thyroid is overactive (hyperthyroidism).  Caffeine.  Excessive alcohol use or illegal drug use.  Use of some medicines, including certain decongestants and diet pills.  Sometimes, the cause cannot  be found. What increases the risk? This condition is more likely to develop in:  People who are older in age.  People who smoke.  People who have diabetes mellitus.  People who are overweight (obese).  Athletes who exercise vigorously.  What are the signs or symptoms? Symptoms of this condition include:  A feeling that your heart is beating rapidly or irregularly.  A feeling of discomfort or pain in your chest.  Shortness of breath.  Sudden light-headedness or weakness.  Getting tired easily during exercise.  In some cases, there are no symptoms. How is this diagnosed? Your health care provider may be able to detect atrial fibrillation when taking your pulse. If detected, this condition may be diagnosed with:  An electrocardiogram (ECG).  A Holter monitor test that records your heartbeat patterns over a 24-hour period.  Transthoracic echocardiogram (TTE) to evaluate how blood flows through your heart.  Transesophageal echocardiogram (TEE) to view more detailed images of your heart.  A stress test.  Imaging tests, such as a CT scan or chest X-ray.  Blood tests.  How is this treated? The main goals of treatment are to prevent blood clots from forming and to keep your heart beating at a normal rate and rhythm. The type of treatment that you receive depends on many factors, such as your underlying medical conditions and how you feel when you are experiencing atrial fibrillation. This condition may be treated with:  Medicine to slow down the heart rate, bring the heart's rhythm back to normal, or prevent clots from forming.  Electrical cardioversion. This is a procedure that resets your heart's rhythm by delivering a controlled, low-energy shock to the heart through your skin.  Different types  of ablation, such as catheter ablation, catheter ablation with pacemaker, or surgical ablation. These procedures destroy the heart tissues that send abnormal signals. When the  pacemaker is used, it is placed under your skin to help your heart beat in a regular rhythm.  Follow these instructions at home:  Take over-the counter and prescription medicines only as told by your health care provider.  If your health care provider prescribed a blood-thinning medicine (anticoagulant), take it exactly as told. Taking too much blood-thinning medicine can cause bleeding. If you do not take enough blood-thinning medicine, you will not have the protection that you need against stroke and other problems.  Do not use tobacco products, including cigarettes, chewing tobacco, and e-cigarettes. If you need help quitting, ask your health care provider.  If you have obstructive sleep apnea, manage your condition as told by your health care provider.  Do not drink alcohol.  Do not drink beverages that contain caffeine, such as coffee, soda, and tea.  Maintain a healthy weight. Do not use diet pills unless your health care provider approves. Diet pills may make heart problems worse.  Follow diet instructions as told by your health care provider.  Exercise regularly as told by your health care provider.  Keep all follow-up visits as told by your health care provider. This is important. How is this prevented?  Avoid drinking beverages that contain caffeine or alcohol.  Avoid certain medicines, especially medicines that are used for breathing problems.  Avoid certain herbs and herbal medicines, such as those that contain ephedra or ginseng.  Do not use illegal drugs, such as cocaine and amphetamines.  Do not smoke.  Manage your high blood pressure. Contact a health care provider if:  You notice a change in the rate, rhythm, or strength of your heartbeat.  You are taking an anticoagulant and you notice increased bruising.  You tire more easily when you exercise or exert yourself. Get help right away if:  You have chest pain, abdominal pain, sweating, or weakness.  You  feel nauseous.  You notice blood in your vomit, bowel movement, or urine.  You have shortness of breath.  You suddenly have swollen feet and ankles.  You feel dizzy.  You have sudden weakness or numbness of the face, arm, or leg, especially on one side of the body.  You have trouble speaking, trouble understanding, or both (aphasia).  Your face or your eyelid droops on one side. These symptoms may represent a serious problem that is an emergency. Do not wait to see if the symptoms will go away. Get medical help right away. Call your local emergency services (911 in the [place] Do not drive yourself to the hospital. This information is not intended to replace advice given to you by your health care provider. Make sure you discuss any questions you have with your health care provider. Document Released: 12/09/2005 Document Revised: 04/17/2016 Document Reviewed: 04/05/2015 Elsevier Interactive Patient Education  [employer].

## 2018-07-31 NOTE — Telephone Encounter (Signed)
Wife reports pt. Was at the gym this morning, and after his work out, his Physiological scientist noted he "looked funny". Pt. Was dizzy and "looked like he might faint." They gave him po liquids and checked his pulse. Pt. Unsure what pulse was. Feels better now, but a little weak. No chest pain or shortness of breath. Appointment made for today.No availability with Dr. Yong Channel. Reason for Disposition . [1] MODERATE dizziness (e.g., interferes with normal activities) AND [2] has NOT been evaluated by physician for this  (Exception: dizziness caused by heat exposure, sudden standing, or poor fluid intake)  Answer Assessment - Initial Assessment Questions 1. DESCRIPTION: "Describe your dizziness."     Lightheadedness 2. LIGHTHEADED: "Do you feel lightheaded?" (e.g., somewhat faint, woozy, weak upon standing)     Almost fainted 3. VERTIGO: "Do you feel like either you or the room is spinning or tilting?" (i.e. vertigo)     No 4. SEVERITY: "How bad is it?"  "Do you feel like you are going to faint?" "Can you stand and walk?"   - MILD - walking normally   - MODERATE - interferes with normal activities (e.g., work, school)    - SEVERE - unable to stand, requires support to walk, feels like passing out now.      Mild 5. ONSET:  "When did the dizziness begin?"     This morning 6. AGGRAVATING FACTORS: "Does anything make it worse?" (e.g., standing, change in head position)     After working out 7. HEART RATE: "Can you tell me your heart rate?" "How many beats in 15 seconds?"  (Note: not all patients can do this)       Unsure 8. CAUSE: "What do you think is causing the dizziness?"     Unsure 9. RECURRENT SYMPTOM: "Have you had dizziness before?" If so, ask: "When was the last time?" "What happened that time?"     No 10. OTHER SYMPTOMS: "Do you have any other symptoms?" (e.g., fever, chest pain, vomiting, diarrhea, bleeding)       No 11. PREGNANCY: "Is there any chance you are pregnant?" "When was your last  menstrual period?"       n/a  Protocols used: DIZZINESS [facility]

## 2018-07-31 NOTE — Addendum Note (Signed)
Addended by: Erlene Quan on: 07/31/2018 02:47 PM   Modules accepted: Orders

## 2018-08-01 ENCOUNTER — Encounter: Payer: Self-pay | Admitting: Family Medicine

## 2018-08-05 ENCOUNTER — Encounter: Payer: Self-pay | Admitting: Family Medicine

## 2018-08-05 ENCOUNTER — Ambulatory Visit (INDEPENDENT_AMBULATORY_CARE_PROVIDER_SITE_OTHER): Payer: Medicare HMO | Admitting: Family Medicine

## 2018-08-05 VITALS — BP 130/70 | HR 60 | Temp 98.2°F | Ht 71.0 in | Wt 192.6 lb

## 2018-08-05 DIAGNOSIS — F320 Major depressive disorder, single episode, mild: Secondary | ICD-10-CM

## 2018-08-05 DIAGNOSIS — I10 Essential (primary) hypertension: Secondary | ICD-10-CM

## 2018-08-05 DIAGNOSIS — G3184 Mild cognitive impairment, so stated: Secondary | ICD-10-CM

## 2018-08-05 DIAGNOSIS — I480 Paroxysmal atrial fibrillation: Secondary | ICD-10-CM | POA: Insufficient documentation

## 2018-08-05 DIAGNOSIS — I4891 Unspecified atrial fibrillation: Secondary | ICD-10-CM

## 2018-08-05 DIAGNOSIS — R69 Illness, unspecified: Secondary | ICD-10-CM | POA: Diagnosis not present

## 2018-08-05 MED ORDER — IRBESARTAN 300 MG PO TABS: 300.0000 mg | ORAL_TABLET | Freq: Every day | ORAL | 3 refills | Status: DC

## 2018-08-05 NOTE — Assessment & Plan Note (Deleted)
S:  patient presented on 07/31/18 after having some diziness while working with Physiological scientist. On EKG had atrial fibrillation. Inda Coke, PA did an excellent job caring for him- placed him on metoprolol XL 25mg  as well as xarelto 20mg  due to chadsvasc of 3.   They may want to try eliquis 5mg  BID- since he takes BID meds. If it is less expensive- but we discussed may be same tier.   He is having some fatigue- perhaps slightly better on metoprolol and weaning off metoprolol A/P: Doesn't appear obviously to be in a fib today though we discussed cant prove that without EKG. Will continue metoprolol and xarelto. Will keep cardiology appointment. We discussed doing echocardiogram possibly- theyd prefer to defer/discuss with cardiology. Had TSH within 2 months.

## 2018-08-05 NOTE — Assessment & Plan Note (Signed)
S: controlled on irbesartan 300mg , hctz 12.5mg , amldoipine 5mg , metoprolol Xl 25mg   BP Readings from Last 3 Encounters:  08/05/18 130/70  07/31/18 124/78  06/01/18 (!) 162/80  A/P: We discussed blood pressure goal of <140/90. Continue current meds- doing ok on metoprolol. He needed 300 mg irbesartan sent in and we did that

## 2018-08-05 NOTE — Assessment & Plan Note (Signed)
S:  patient presented on 07/31/18 after having some diziness while working with Physiological scientist. On EKG had atrial fibrillation. Inda Coke, PA did an excellent job caring for him- placed him on metoprolol XL 25mg  as well as xarelto 20mg  due to chadsvasc of 3.   They may want to try eliquis 5mg  BID- since he takes BID meds. If it is less expensive- but we discussed may be same tier.   He is having some fatigue- perhaps slightly better on metoprolol and weaning off metoprolol A/P: Doesn't appear obviously to be in a fib today though we discussed cant prove that without EKG. Will continue metoprolol and xarelto. Will keep cardiology appointment. We discussed doing echocardiogram possibly- theyd prefer to defer/discuss with cardiology. Had TSH within 2 months.   Reflecting back to a presyncopal event months ago that was thought to be due to dehydration- they now wonder if a fib could have been the cause and I discussed with them certainly possible

## 2018-08-05 NOTE — Assessment & Plan Note (Addendum)
Was diagnosed as alzheimers disease based off imaging by [facility]. They did not diagnose as dementia yet but stated that would happen eventually. His clinical trial ended and he would like to be established with local neurologist. They ask to be referred back to neurology- see referral tab- which was done today

## 2018-08-05 NOTE — Progress Notes (Signed)
Subjective:  Wesley Fuller is a 77 y.o. year old very pleasant male patient who presents for/with See problem oriented charting ROS- some fatigue. No chest pain. No increased edema. No shortness of breath reported   Past Medical History-  Patient Active Problem List   Diagnosis Date Noted  . MCI (mild cognitive impairment) 08/22/2015    Priority: High  . Depression, major, single episode, mild ([place]) 06/01/2018    Priority: Medium  . Hyperglycemia 10/15/2016    Priority: Medium  . Essential hypertension 02/09/2008    Priority: Medium  . History of melanoma 02/09/2008    Priority: Medium  . Osteoarthritis of left knee 03/02/2014    Priority: Low  . VARICOSE VEINS LOWER EXTREMITIES W/INFLAMMATION 02/20/2010    Priority: Low  . GERD 11/21/2008    Priority: Low  . Actinic keratosis 10/12/2008    Priority: Low  . ERECTILE DYSFUNCTION 02/09/2008    Priority: Low  . RENAL CALCULUS, HX OF 02/09/2008    Priority: Low    Medications- reviewed and updated Current Outpatient Medications  Medication Sig Dispense Refill  . amLODipine (NORVASC) 5 MG tablet Take 1 tablet (5 mg total) by mouth daily. (Patient taking differently: Take 2.5 mg by mouth daily. ) 90 tablet 3  . calcium-vitamin D (OSCAL WITH D) 500-200 MG-UNIT per tablet Take 2 tablets by mouth every morning.     . donepezil (ARICEPT) 5 MG tablet Take 1 tablet (5 mg total) by mouth at bedtime. 90 tablet 1  . hydrochlorothiazide (HYDRODIURIL) 12.5 MG tablet Take 1 tablet (12.5 mg total) by mouth daily. 90 tablet 3  . irbesartan (AVAPRO) 300 MG tablet Take 1 tablet (300 mg total) by mouth daily. 90 tablet 3  . metoprolol succinate (TOPROL-XL) 25 MG 24 hr tablet Take 1 tablet (25 mg total) by mouth daily. 90 tablet 3  . rivaroxaban (XARELTO) 20 MG TABS tablet Take 1 tablet (20 mg total) by mouth daily with supper. 30 tablet 1   No current facility-administered medications for this visit.     Objective: BP 130/70 (BP Location:  Left Arm, Patient Position: Sitting, Cuff Size: Large)   Pulse 60   Temp 98.2 F (36.8 C) (Oral)   Ht 5\' 11"  (1.803 m)   Wt 192 lb 9.6 oz (87.4 kg)   SpO2 96%   BMI 26.86 kg/m  Gen: NAD, resting comfortably CV: RRR no murmurs rubs or gallops Lungs: CTAB no crackles, wheeze, rhonchi Abdomen: soft/nontender/nondistended/normal bowel sounds.  Ext: trace edema Skin: warm, dry  Assessment/Plan:  Essential hypertension S: controlled on irbesartan 300mg , hctz 12.5mg , amldoipine 5mg , metoprolol Xl 25mg   BP Readings from Last 3 Encounters:  08/05/18 130/70  07/31/18 124/78  06/01/18 (!) 162/80  A/P: We discussed blood pressure goal of <140/90. Continue current meds- doing ok on metoprolol. He needed 300 mg irbesartan sent in and we did that  Depression, major, single episode, mild ([place]) S:Feeling better weaning off antidepressant- felt worn down (wonde if a fib could have caused it) A/P: we will monitor PHQ9 at follow up- if worsening and fatigue gone perhaps try different antidepressant   New onset atrial fibrillation [facility]) S:  patient presented on 07/31/18 after having some diziness while working with Physiological scientist. On EKG had atrial fibrillation. Inda Coke, PA did an excellent job caring for him- placed him on metoprolol XL 25mg  as well as xarelto 20mg  due to chadsvasc of 3.   They may want to try eliquis 5mg  BID- since he takes  BID meds. If it is less expensive- but we discussed may be same tier.   He is having some fatigue- perhaps slightly better on metoprolol and weaning off metoprolol A/P: Doesn't appear obviously to be in a fib today though we discussed cant prove that without EKG. Will continue metoprolol and xarelto. Will keep cardiology appointment. We discussed doing echocardiogram possibly- theyd prefer to defer/discuss with cardiology. Had TSH within 2 months.   Reflecting back to a presyncopal event months ago that was thought to be due to dehydration- they now  wonder if a fib could have been the cause and I discussed with them certainly possible   MCI (mild cognitive impairment) Was diagnosed as alzheimers disease based off imaging by [facility]. They did not diagnose as dementia yet but stated that would happen eventually. His clinical trial ended and he would like to be established with local neurologist. They ask to be referred back to neurology- see referral tab- which was done today   Future Appointments  Date Time Provider [place]  08/26/2018  8:40 AM [name], Reita Cliche, MD [facility] None  08/31/2018  3:30 PM Cameron Sprang, MD [facility] None   Lab/Order associations: MCI (mild cognitive impairment) - Plan: Ambulatory referral to Neurology  Essential hypertension  Depression, major, single episode, mild ([place])  Meds ordered this encounter  Medications  . irbesartan (AVAPRO) 300 MG tablet    Sig: Take 1 tablet (300 mg total) by mouth daily.    Dispense:  90 tablet    Refill:  3    Return precautions advised.  Garret Reddish, MD

## 2018-08-05 NOTE — Patient Instructions (Addendum)
Health Maintenance Due  Topic Date Due  . INFLUENZA VACCINE -schedule for this Fall 07/23/2018   No changes today.   Hope yall have a great trip and glad you are set up with cardiology when you get back

## 2018-08-05 NOTE — Assessment & Plan Note (Signed)
S:Feeling better weaning off antidepressant- felt worn down (wonde if a fib could have caused it) A/P: we will monitor PHQ9 at follow up- if worsening and fatigue gone perhaps try different antidepressant

## 2018-08-26 ENCOUNTER — Ambulatory Visit: Payer: Medicare HMO | Admitting: Cardiology

## 2018-08-26 ENCOUNTER — Encounter: Payer: Self-pay | Admitting: Cardiology

## 2018-08-26 VITALS — BP 138/72 | HR 66 | Ht 71.0 in | Wt 190.0 lb

## 2018-08-26 DIAGNOSIS — I480 Paroxysmal atrial fibrillation: Secondary | ICD-10-CM | POA: Insufficient documentation

## 2018-08-26 DIAGNOSIS — R69 Illness, unspecified: Secondary | ICD-10-CM | POA: Diagnosis not present

## 2018-08-26 DIAGNOSIS — I10 Essential (primary) hypertension: Secondary | ICD-10-CM | POA: Diagnosis not present

## 2018-08-26 DIAGNOSIS — I4891 Unspecified atrial fibrillation: Secondary | ICD-10-CM

## 2018-08-26 NOTE — Progress Notes (Signed)
Cardiology Office Note:    Date:  08/26/2018   ID:  Wesley Fuller, DOB 1941-10-24, MRN [id number]  PCP:  Marin Olp, MD  Cardiologist:  Jenean Lindau, MD   Referring MD: Inda Coke, PA    ASSESSMENT:    1. New onset atrial fibrillation ([place])   2. Essential hypertension   3. Paroxysmal atrial fibrillation ([facility])    PLAN:    In order of problems listed above:  1. Primary prevention stressed with the patient.  Importance of compliance with diet and medication stressed and he vocalized understanding.  His blood pressure is stable.  His effort tolerance is good and he has no symptoms suggesting of any anginal process. 2. Recent TSH was unremarkable. 3. Echocardiogram will be done to assess murmur heard on auscultation.  It will also help me assess left ventricular systolic function and left atrial size. 4. I told him to discontinue metoprolol and use it only on a as needed basis in view of fatigue and borderline blood pressure.I discussed with the patient atrial fibrillation, disease process. Management and therapy including rate and rhythm control, anticoagulation benefits and potential risks were discussed extensively with the patient. Patient had multiple questions which were answered to patient's satisfaction. 5. . Patient will be seen in follow-up appointment in 6 months or earlier if the patient has any concerns    Medication Adjustments/Labs and Tests Ordered: Current medicines are reviewed at length with the patient today.  Concerns regarding medicines are outlined above.  No orders of the defined types were placed in this encounter.  No orders of the defined types were placed in this encounter.    History of Present Illness:    Wesley Fuller is a 77 y.o. male who is being seen today for the evaluation of paroxysmal atrial fibrillation at the request of Inda Coke, [place].  Patient is a pleasant 77 year old male.  He is accompanied by his wife for this  visit.  He has a history of essential hypertension.  He was recently found to have atrial fibrillation for which she was referred here.  He mentions of some fatigue.  No chest pain orthopnea PND or any palpitations.  At the time of my evaluation, the patient is alert awake oriented and in no distress.  His wife mentions to me that he has issues with dementia and he was in a study but that study ended.  Past Medical History:  Diagnosis Date  . Allergy    seasonal  . Asthma    as a child  . Calcium oxalate renal stones   . Cataract 2013  . Diverticulosis   . ED (erectile dysfunction)   . GERD (gastroesophageal reflux disease)   . Hypertension   . Melanoma of back ([place]) 1974  . Nail fungal infection   . Osteoarthritis of left knee 03/02/2014    Past Surgical History:  Procedure Laterality Date  . HERNIA REPAIR     x 2  . left leg stripping    . lymph node disection  1981  . melanoma removal of back   1974  . NASAL POLYP SURGERY    . skin cancer    . TONSILLECTOMY  1974    Current Medications: Current Meds  Medication Sig  . amLODipine (NORVASC) 5 MG tablet Take 1 tablet (5 mg total) by mouth daily. (Patient taking differently: Take 2.5 mg by mouth daily. )  . donepezil (ARICEPT) 5 MG tablet Take 1 tablet (5 mg total)  by mouth at bedtime.  . hydrochlorothiazide (HYDRODIURIL) 12.5 MG tablet Take 1 tablet (12.5 mg total) by mouth daily.  . irbesartan (AVAPRO) 300 MG tablet Take 1 tablet (300 mg total) by mouth daily.  . metoprolol succinate (TOPROL-XL) 25 MG 24 hr tablet Take 1 tablet (25 mg total) by mouth daily.  . rivaroxaban (XARELTO) 20 MG TABS tablet Take 1 tablet (20 mg total) by mouth daily with supper.     Allergies:   Patient has no known allergies.   Social History   Socioeconomic History  . Marital status: Married    Spouse name: Not on file  . Number of children: 2  . Years of education: Not on file  . Highest education level: Not on file  Occupational  History  . Occupation: Retired  Scientific laboratory technician  . Financial resource strain: Not on file  . Food insecurity:    Worry: Not on file    Inability: Not on file  . Transportation needs:    Medical: Not on file    Non-medical: Not on file  Tobacco Use  . Smoking status: Never Smoker  . Smokeless tobacco: Never Used  Substance and Sexual Activity  . Alcohol use: Yes    Alcohol/week: 5.0 standard drinks    Types: 5 Glasses of wine per week    Comment: does not drink very much; glass of wine every day  . Drug use: No  . Sexual activity: Yes  Lifestyle  . Physical activity:    Days per week: Not on file    Minutes per session: Not on file  . Stress: Not on file  Relationships  . Social connections:    Talks on phone: Not on file    Gets together: Not on file    Attends religious service: Not on file    Active member of club or organization: Not on file    Attends meetings of clubs or organizations: Not on file    Relationship status: Not on file  Other Topics Concern  . Not on file  Social History Narrative   Married ([name]) 104. 2 sons- 1 in [place]. 3 grandsons.       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  ROS:   Please see the history of present illness.    All other systems reviewed and are negative.  EKGs/Labs/Other Studies Reviewed:    The following studies were reviewed today: I discussed my findings with the patient at extensive length.  EKG reveals atrial fibrillation with controlled ventricular rate.  Today's EKG reveals sinus rhythm and nonspecific ST-T changes.   Recent Labs: 06/03/2018: ALT 20; BUN 16; Creatinine, Ser 0.93; Hemoglobin 16.5; Platelets 183.0; Potassium 3.9; Sodium 140; TSH 2.05  Recent Lipid  Panel    Component Value Date/Time   CHOL 159 10/09/2017 0807   TRIG 105.0 10/09/2017 0807   HDL 52.30 10/09/2017 0807   CHOLHDL 3 10/09/2017 0807   VLDL 21.0 10/09/2017 0807   LDLCALC 86 10/09/2017 0807    Physical Exam:    VS:  BP 138/72 (BP Location: Right Arm, Patient Position: Sitting, Cuff Size: Normal)   Pulse 66   Ht 5\' 11"  (1.803 m)   Wt 190 lb (86.2 kg)   SpO2 97%  BMI 26.50 kg/m     Wt Readings from Last 3 Encounters:  08/26/18 190 lb (86.2 kg)  08/05/18 192 lb 9.6 oz (87.4 kg)  07/31/18 192 lb 9.6 oz (87.4 kg)     GEN: Patient is in no acute distress HEENT: Normal NECK: No JVD; No carotid bruits LYMPHATICS: No lymphadenopathy CARDIAC: S1 S2 regular, 2/6 systolic murmur at the apex. RESPIRATORY:  Clear to auscultation without rales, wheezing or rhonchi  ABDOMEN: Soft, non-tender, non-distended MUSCULOSKELETAL:  No edema; No deformity  SKIN: Warm and dry NEUROLOGIC:  Alert and oriented x 3 PSYCHIATRIC:  Normal affect    Signed, Jenean Lindau, MD  08/26/2018 9:16 AM    [place]

## 2018-08-26 NOTE — Patient Instructions (Signed)
Medication Instructions:  Your physician has recommended you make the following change in your medication:  STOP metoprolol  Labwork: None  Testing/Procedures: Your physician has requested that you have an echocardiogram. Echocardiography is a painless test that uses sound waves to create images of your heart. It provides your doctor with information about the size and shape of your heart and how well your heart's chambers and valves are working. This procedure takes approximately one hour. There are no restrictions for this procedure.  Follow-Up: Your physician recommends that you schedule a follow-up appointment in: 6 months  Any Other Special Instructions Will Be Listed Below (If Applicable).     If you need a refill on your cardiac medications before your next appointment, please call your pharmacy.   [place], RN, BSN

## 2018-08-31 ENCOUNTER — Other Ambulatory Visit: Payer: Self-pay

## 2018-08-31 ENCOUNTER — Ambulatory Visit: Payer: Medicare HMO | Admitting: Neurology

## 2018-08-31 ENCOUNTER — Encounter: Payer: Self-pay | Admitting: Neurology

## 2018-08-31 VITALS — BP 136/66 | HR 69 | Ht 72.0 in | Wt 193.0 lb

## 2018-08-31 DIAGNOSIS — Z0279 Encounter for issue of other medical certificate: Secondary | ICD-10-CM

## 2018-08-31 DIAGNOSIS — F0390 Unspecified dementia without behavioral disturbance: Secondary | ICD-10-CM | POA: Diagnosis not present

## 2018-08-31 DIAGNOSIS — F03A0 Unspecified dementia, mild, without behavioral disturbance, psychotic disturbance, mood disturbance, and anxiety: Secondary | ICD-10-CM

## 2018-08-31 DIAGNOSIS — R69 Illness, unspecified: Secondary | ICD-10-CM | POA: Diagnosis not present

## 2018-08-31 MED ORDER — RIVAROXABAN 20 MG PO TABS: 20.0000 mg | ORAL_TABLET | Freq: Every day | ORAL | 2 refills | Status: DC

## 2018-08-31 MED ORDER — DONEPEZIL HCL 10 MG PO TABS: 10.0000 mg | ORAL_TABLET | Freq: Every day | ORAL | 3 refills | Status: DC

## 2018-08-31 NOTE — Patient Instructions (Signed)
1. Increase Donepezil to 10mg  daily 2. Follow-up in 6 months, call for any changes  FALL PRECAUTIONS: Be cautious when walking. Scan the area for obstacles that may increase the risk of trips and falls. When getting up in the mornings, sit up at the edge of the bed for a few minutes before getting out of bed. Consider elevating the bed at the head end to avoid drop of blood pressure when getting up. Walk always in a well-lit room (use night lights in the walls). Avoid area rugs or power cords from appliances in the middle of the walkways. Use a walker or a cane if necessary and consider physical therapy for balance exercise. Get your eyesight checked regularly.  FINANCIAL OVERSIGHT: Supervision, especially oversight when making financial decisions or transactions is also recommended.  HOME SAFETY: Consider the safety of the kitchen when operating appliances like stoves, microwave oven, and blender. Consider having supervision and share cooking responsibilities until no longer able to participate in those. Accidents with firearms and other hazards in the house should be identified and addressed as well.  DRIVING: Regarding driving, in patients with progressive memory problems, driving will be impaired. We advise to have someone else do the driving if trouble finding directions or if minor accidents are reported. Independent driving assessment is available to determine safety of driving.  ABILITY TO BE LEFT ALONE: If patient is unable to contact 911 operator, consider using LifeLine, or when the need is there, arrange for someone to stay with patients. Smoking is a fire hazard, consider supervision or cessation. Risk of wandering should be assessed by caregiver and if detected at any point, supervision and safe proof recommendations should be instituted.  MEDICATION SUPERVISION: Inability to self-administer medication needs to be constantly addressed. Implement a mechanism to ensure safe administration of  the medications.  RECOMMENDATIONS FOR ALL PATIENTS WITH MEMORY PROBLEMS: 1. Continue to exercise (Recommend 30 minutes of walking everyday, or 3 hours every week) 2. Increase social interactions - continue going to [place] and enjoy social gatherings with friends and family 3. Eat healthy, avoid fried foods and eat more fruits and vegetables 4. Maintain adequate blood pressure, blood sugar, and blood cholesterol level. Reducing the risk of stroke and cardiovascular disease also helps promoting better memory. 5. Avoid stressful situations. Live a simple life and avoid aggravations. Organize your time and prepare for the next day in anticipation. 6. Sleep well, avoid any interruptions of sleep and avoid any distractions in the bedroom that may interfere with adequate sleep quality 7. Avoid sugar, avoid sweets as there is a strong link between excessive sugar intake, diabetes, and cognitive impairment We discussed the Mediterranean diet, which has been shown to help patients reduce the risk of progressive memory disorders and reduces cardiovascular risk. This includes eating fish, eat fruits and green leafy vegetables, nuts like almonds and hazelnuts, walnuts, and also use olive oil. Avoid fast foods and fried foods as much as possible. Avoid sweets and sugar as sugar use has been linked to worsening of memory function.  There is always a concern of gradual progression of memory problems. If this is the case, then we may need to adjust level of care according to patient needs. Support, both to the patient and caregiver, should then be put into place.

## 2018-08-31 NOTE — Progress Notes (Signed)
NEUROLOGY FOLLOW UP OFFICE NOTE  [name] [id number] 01-Oct-1941  HISTORY OF PRESENT ILLNESS: I had the pleasure of seeing Wesley Fuller in follow-up in the neurology clinic on 08/31/2018.  The patient was last seen almost 3 years ago (December 2016) for worsening memory. He is again accompanied by his wife who helps supplement the history today.  Records and images were personally reviewed where available.  Since his last visit, he and his wife report that he was enrolled in a dementia study at [facility] for [facility]. He qualified for the study because he had "plaque in the brain." He and his wife expressed disappointment that the study was discontinued (not due to adverse effects per report). He got out of the study in September 2018 and started Donepezil 5mg  daily, but continued with the infusions until March 2019. They found out that he was part of the treatment arm and received the full dose the entire time. He and his wife noticed a difference while undergoing the study, and they state the nurses could also tell a difference. Since off the drug, he has noticed worsening cognition. His wife has noticed more short-term memory issues. He continues to handle his medication pretty well. He drives without getting lost. His wife manages finances. He notices more difficulties when he is under pressure. He has also noticed some fatigue. Sleep is good. He denies any headaches, dizziness, vision changes, no falls. There were 2 instances while travelling that they were concerned he may fall.. They were on a trip and his wife noticed he was leaning to the right so much but he was tired and dehydrated. Last May he had something similar, he was walking very fast like he was going to fall. They report both times were "high pressure and very active." He feels his mood is pretty good, his wife reports he tried Lexapro which caused constipation and short-term memory seemed worse the 5-6 weeks he was taking it.    History on Initial Assessment 11/29/2015: This is a very pleasant 77 year old right-handed man with a history of hypertension, presenting for evaluation of worsening memory. He started noticing memory changes over the past 9-12 months, however his wife reports noticing changes around 2-1/2 years ago. He mostly notices difficulties with name and word retrieval, as well as misplacing things at home. His wife reports that they were playing bridge 2-1/2 years ago and he got confused with what cards to play. He does not recall this incident. Another time they were visiting their son in [place] and kept changing locations, but he would be confused that whole week as to what day it was or could not recall where they should be. Last September, he was driving out of state for a golf game that he attends once a year, but became so anxious about his drive and uncertain despite being given directions. His wife states it is out of character for him to be anxious. His wife reports that despite writing things down, he would get confused if he said yes to a commitment. He is more unsure of himself on the computer and cellphone, asking his wife to check if an email he wrote looked okay. He has occasional word-finding difficulties and has always had problems multitasking. He denies getting lost driving, no missed medications or bill payments, although his wife pays majority of bills. He reports an odd instance 2-1/2 weeks ago while out of town, he went to bed early and thought he was dreaming that  he was in his house but it looked different, he got up and asked people what was going on, thinking he was still dreaming, but was later on told by friends that he did come and ask them this. He was more tired that day from driving long distance. His wife reports that she notices more confusion and memory issues when he is tired. MMSE at PCP office last 08/22/15 was 27/30. He was offered Aricept but did not start this, instead they have  been to [facility] for consideration of joining a research study. He underwent neurocognitive testing but they have not heard results yet.  He denies any headaches, dizziness, diplopia, dysarthria, dysphagia, neck/back pain, focal numbness/tingling/weakness, tremors, no falls. He has a change in stool consistency. He reports chronic poor sense of smell. No REM behavior disorder. He denies any significant head injuries. His brother has Parkinson's dementia and had a good response to Aricept. He drinks one glass of wine a night, 1-2 drinks occasionally on weekends.  PAST MEDICAL HISTORY: Past Medical History:  Diagnosis Date  . Allergy    seasonal  . Asthma    as a child  . Calcium oxalate renal stones   . Cataract 2013  . Diverticulosis   . ED (erectile dysfunction)   . GERD (gastroesophageal reflux disease)   . Hypertension   . Melanoma of back ([place]) 1974  . Nail fungal infection   . Osteoarthritis of left knee 03/02/2014    MEDICATIONS: Current Outpatient Medications on File Prior to Visit  Medication Sig Dispense Refill  . amLODipine (NORVASC) 5 MG tablet Take 1 tablet (5 mg total) by mouth daily. (Patient taking differently: Take 2.5 mg by mouth daily. ) 90 tablet 3  . donepezil (ARICEPT) 5 MG tablet Take 1 tablet (5 mg total) by mouth at bedtime. 90 tablet 1  . hydrochlorothiazide (HYDRODIURIL) 12.5 MG tablet Take 1 tablet (12.5 mg total) by mouth daily. 90 tablet 3  . irbesartan (AVAPRO) 300 MG tablet Take 1 tablet (300 mg total) by mouth daily. 90 tablet 3   No current facility-administered medications on file prior to visit.     ALLERGIES: No Known Allergies  FAMILY HISTORY: Family History  Problem Relation Age of Onset  . Colon cancer Father   . Colon cancer Paternal Uncle   . Colon cancer Paternal Uncle   . Hypertension Unknown     SOCIAL HISTORY: Social History   Socioeconomic History  . Marital status: Married    Spouse name: Not on file  . Number of  children: 2  . Years of education: Not on file  . Highest education level: Not on file  Occupational History  . Occupation: Retired  Scientific laboratory technician  . Financial resource strain: Not on file  . Food insecurity:    Worry: Not on file    Inability: Not on file  . Transportation needs:    Medical: Not on file    Non-medical: Not on file  Tobacco Use  . Smoking status: Never Smoker  . Smokeless tobacco: Never Used  Substance and Sexual Activity  . Alcohol use: Yes    Alcohol/week: 5.0 standard drinks    Types: 5 Glasses of wine per week    Comment: does not drink very much; glass of wine every day  . Drug use: No  . Sexual activity: Yes  Lifestyle  . Physical activity:    Days per week: Not on file    Minutes per session: Not on file  .  Stress: Not on file  Relationships  . Social connections:    Talks on phone: Not on file    Gets together: Not on file    Attends religious service: Not on file    Active member of club or organization: Not on file    Attends meetings of clubs or organizations: Not on file    Relationship status: Not on file  . Intimate partner violence:    Fear of current or ex partner: Not on file    Emotionally abused: Not on file    Physically abused: Not on file    Forced sexual activity: Not on file  Other Topics Concern  . Not on file  Social History Narrative   Married ([name]) 32. 2 sons- 1 in [place]. 3 grandsons.       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[employer], [employer]: golf, travel    REVIEW OF SYSTEMS: Constitutional: No fevers, chills, or sweats, no generalized fatigue, change in appetite Eyes: No visual changes, double vision, eye pain Ear, nose and throat: No hearing loss, ear pain, nasal congestion, sore throat Cardiovascular: No chest pain, palpitations Respiratory:  No shortness of breath at rest or with exertion, wheezes GastrointestinaI: No nausea, vomiting,  diarrhea, abdominal pain, fecal incontinence Genitourinary:  No dysuria, urinary retention or frequency Musculoskeletal:  No neck pain, back pain Integumentary: No rash, pruritus, skin lesions Neurological: as above Psychiatric: No depression, insomnia, anxiety Endocrine: No palpitations, fatigue, diaphoresis, mood swings, change in appetite, change in weight, increased thirst Hematologic/Lymphatic:  No anemia, purpura, petechiae. Allergic/Immunologic: no itchy/runny eyes, nasal congestion, recent allergic reactions, rashes  PHYSICAL EXAM: Vitals:   08/31/18 1527  BP: 136/66  Pulse: 69  SpO2: 96%   General: No acute distress Head:  Normocephalic/atraumatic Neck: supple, no paraspinal tenderness, full range of motion Heart:  Regular rate and rhythm Lungs:  Clear to auscultation bilaterally Back: No paraspinal tenderness Skin/Extremities: No rash, no edema Neurological Exam: alert and oriented to person, place, and time. No aphasia or dysarthria. Fund of knowledge is appropriate.  Recent and remote memory are impaired.  Attention and concentration are normal.    Able to name objects and repeat phrases.  Montreal Cognitive Assessment  08/31/2018 11/29/2015  Visuospatial/ Executive (0/5) 3 5  Naming (0/3) 3 3  Attention: Read list of digits (0/2) 1 2  Attention: Read list of letters (0/1) 1 1  Attention: Serial 7 subtraction starting at 100 (0/3) 2 3  Language: Repeat phrase (0/2) 2 2  Language : Fluency (0/1) 1 1  Abstraction (0/2) 2 2  Delayed Recall (0/5) 2 2  Orientation (0/6) 5 6  Total 22 27  Adjusted Score (based on education) - 27   Cranial nerves: Pupils equal, round, reactive to light.  Extraocular movements intact with no nystagmus. Visual fields full. Facial sensation intact. No facial asymmetry. Tongue, uvula, palate midline.  Motor: Bulk and tone normal, muscle strength 5/5 throughout with no pronator drift.  Sensation to light touch intact.  No extinction to double  simultaneous stimulation.  Deep tendon reflexes 2+ throughout, toes downgoing.  Finger to nose testing intact.  Gait slightly wide-based, no ataxia.  Romberg negative.  IMPRESSION: This is a very pleasant 77 yo RH man with hypertension, atrial fibrillation, and mild dementia. MOCA score today 22/30 (27/30 in December 2016). We discussed increasing Aricept to 10mg  daily. He recently concluded a dementia study drug Aducanumab and felt that  it did help, however the study has been discontinued. They are interested in joining another study at [facility] in March 2020. We discussed the diagnosis, prognosis, and the importance of control of vascular risk factors, physical exercise, and brain stimulation exercises for brain health. He was encouraged to start using a pillbox. Continue to monitor driving. He will follow-up in 6 months and knows to call for any changes.   Thank you for allowing me to participate in his care.  Please do not hesitate to call for any questions or concerns.  The duration of this appointment visit was 30 minutes of face-to-face time with the patient.  Greater than 50% of this time was spent in counseling, explanation of diagnosis, planning of further management, and coordination of care.   Ellouise Newer, M.D.   CC: Dr. Yong Channel

## 2018-09-01 ENCOUNTER — Other Ambulatory Visit: Payer: Self-pay

## 2018-09-01 ENCOUNTER — Ambulatory Visit (HOSPITAL_COMMUNITY): Payer: Medicare HMO | Attending: Cardiology

## 2018-09-01 DIAGNOSIS — I77810 Thoracic aortic ectasia: Secondary | ICD-10-CM | POA: Diagnosis not present

## 2018-09-01 DIAGNOSIS — I10 Essential (primary) hypertension: Secondary | ICD-10-CM | POA: Insufficient documentation

## 2018-09-01 DIAGNOSIS — I4891 Unspecified atrial fibrillation: Secondary | ICD-10-CM

## 2018-09-04 ENCOUNTER — Encounter: Payer: Self-pay | Admitting: Physician Assistant

## 2018-09-15 DIAGNOSIS — L570 Actinic keratosis: Secondary | ICD-10-CM | POA: Diagnosis not present

## 2018-09-15 DIAGNOSIS — Z1283 Encounter for screening for malignant neoplasm of skin: Secondary | ICD-10-CM | POA: Diagnosis not present

## 2018-09-15 DIAGNOSIS — L821 Other seborrheic keratosis: Secondary | ICD-10-CM | POA: Diagnosis not present

## 2018-09-15 DIAGNOSIS — Z08 Encounter for follow-up examination after completed treatment for malignant neoplasm: Secondary | ICD-10-CM | POA: Diagnosis not present

## 2018-09-15 DIAGNOSIS — Z85820 Personal history of malignant melanoma of skin: Secondary | ICD-10-CM | POA: Diagnosis not present

## 2018-09-15 DIAGNOSIS — X32XXXD Exposure to sunlight, subsequent encounter: Secondary | ICD-10-CM | POA: Diagnosis not present

## 2018-09-16 DIAGNOSIS — I4891 Unspecified atrial fibrillation: Secondary | ICD-10-CM | POA: Diagnosis not present

## 2018-09-16 DIAGNOSIS — N529 Male erectile dysfunction, unspecified: Secondary | ICD-10-CM | POA: Diagnosis not present

## 2018-09-16 DIAGNOSIS — Z7901 Long term (current) use of anticoagulants: Secondary | ICD-10-CM | POA: Diagnosis not present

## 2018-09-16 DIAGNOSIS — Z833 Family history of diabetes mellitus: Secondary | ICD-10-CM | POA: Diagnosis not present

## 2018-09-16 DIAGNOSIS — Z809 Family history of malignant neoplasm, unspecified: Secondary | ICD-10-CM | POA: Diagnosis not present

## 2018-09-16 DIAGNOSIS — R69 Illness, unspecified: Secondary | ICD-10-CM | POA: Diagnosis not present

## 2018-09-16 DIAGNOSIS — G309 Alzheimer's disease, unspecified: Secondary | ICD-10-CM | POA: Diagnosis not present

## 2018-09-16 DIAGNOSIS — I10 Essential (primary) hypertension: Secondary | ICD-10-CM | POA: Diagnosis not present

## 2018-09-22 ENCOUNTER — Ambulatory Visit (INDEPENDENT_AMBULATORY_CARE_PROVIDER_SITE_OTHER): Payer: Medicare HMO | Admitting: Family Medicine

## 2018-09-22 ENCOUNTER — Encounter: Payer: Self-pay | Admitting: Family Medicine

## 2018-09-22 VITALS — BP 132/72 | HR 63 | Temp 98.5°F | Ht 72.0 in | Wt 191.6 lb

## 2018-09-22 DIAGNOSIS — H6121 Impacted cerumen, right ear: Secondary | ICD-10-CM | POA: Diagnosis not present

## 2018-09-22 DIAGNOSIS — H6123 Impacted cerumen, bilateral: Secondary | ICD-10-CM | POA: Diagnosis not present

## 2018-09-22 DIAGNOSIS — Z23 Encounter for immunization: Secondary | ICD-10-CM | POA: Diagnosis not present

## 2018-09-22 NOTE — Patient Instructions (Signed)
Health Maintenance Due  Topic Date Due  . INFLUENZA VACCINE - today 07/23/2018   You and Wesley Fuller did great today! Ears are pefectly clear after irrigation. Return as needed

## 2018-09-22 NOTE — Progress Notes (Signed)
Subjective:  Wesley Fuller is a 77 y.o. year old very pleasant male patient who presents for/with See problem oriented charting ROS- hearing loss and ear fullness noted. No chest pain or shortness of breath reported. Stable edema.    Past Medical History-  Patient Active Problem List   Diagnosis Date Noted  . MCI (mild cognitive impairment) 08/22/2015    Priority: High  . Depression, major, single episode, mild ([place]) 06/01/2018    Priority: Medium  . Hyperglycemia 10/15/2016    Priority: Medium  . Essential hypertension 02/09/2008    Priority: Medium  . History of melanoma 02/09/2008    Priority: Medium  . Osteoarthritis of left knee 03/02/2014    Priority: Low  . VARICOSE VEINS LOWER EXTREMITIES W/INFLAMMATION 02/20/2010    Priority: Low  . GERD 11/21/2008    Priority: Low  . Actinic keratosis 10/12/2008    Priority: Low  . ERECTILE DYSFUNCTION 02/09/2008    Priority: Low  . RENAL CALCULUS, HX OF 02/09/2008    Priority: Low  . Paroxysmal atrial fibrillation ([place]) 08/26/2018  . New onset atrial fibrillation ([place]) 08/05/2018  . Low blood pressure reading 04/13/2018  . Depression 03/23/2018  . Asymmetric SNHL (sensorineural hearing loss) 04/11/2016    Medications- reviewed and updated Current Outpatient Medications  Medication Sig Dispense Refill  . amLODipine (NORVASC) 5 MG tablet Take 1 tablet (5 mg total) by mouth daily. (Patient taking differently: Take 2.5 mg by mouth daily. ) 90 tablet 3  . donepezil (ARICEPT) 10 MG tablet Take 1 tablet (10 mg total) by mouth at bedtime. 90 tablet 3  . hydrochlorothiazide (HYDRODIURIL) 12.5 MG tablet Take 1 tablet (12.5 mg total) by mouth daily. 90 tablet 3  . irbesartan (AVAPRO) 300 MG tablet Take 1 tablet (300 mg total) by mouth daily. 90 tablet 3  . rivaroxaban (XARELTO) 20 MG TABS tablet Take 1 tablet (20 mg total) by mouth daily with supper. 90 tablet 2   No current facility-administered medications for this visit.      Objective: BP 132/72 (BP Location: Left Arm, Patient Position: Sitting, Cuff Size: Large)   Pulse 63   Temp 98.5 F (36.9 C) (Oral)   Ht 6' (1.829 m)   Wt 191 lb 9.6 oz (86.9 kg)   SpO2 96%   BMI 25.99 kg/m  Gen: NAD, resting comfortably TM obstructed prior to irrigation, full view afterwards CV: regular rate no murmurs rubs or gallops Lungs: CTAB no crackles, wheeze, rhonchi Abdomen: soft/nontender/nondistended/normal bowel sounds.  Ext: 1+ edema Skin: warm, dry  Procedure note: Cerumen noted in bilateral ear.  Irrigation with water and peroxide performed. Full view of tympanic membrane after procedure.  Patient tolerated procedure well  Assessment/Plan:  Cerumen impaction bilateral ear S:  2-3 weeks of feeling of ear fullness. In last few days had some hearing loss above baseline. Has tried OTC drops without relief. No fever, chills, nasal congestion. Patient with baseline hearing loss and wears hearing aids but current issues have worsened symptoms.  A/P: cerumen impaction leading to hearing loss. Resolved with irrigation of bilateral ears. Will return to care if recurrence.   Future Appointments  Date Time Provider [place]  04/27/2019 10:00 AM Cameron Sprang, MD [facility] None   Lab/Order associations: Need for prophylactic vaccination and inoculation against influenza - Plan: Flu vaccine HIGH DOSE PF  Hearing loss of both ears due to cerumen impaction  Impacted cerumen of right ear  Return precautions advised.  Garret Reddish, MD

## 2018-11-11 DIAGNOSIS — H40053 Ocular hypertension, bilateral: Secondary | ICD-10-CM | POA: Diagnosis not present

## 2018-11-11 DIAGNOSIS — H40013 Open angle with borderline findings, low risk, bilateral: Secondary | ICD-10-CM | POA: Diagnosis not present

## 2018-11-11 DIAGNOSIS — M1712 Unilateral primary osteoarthritis, left knee: Secondary | ICD-10-CM | POA: Diagnosis not present

## 2018-11-11 DIAGNOSIS — H524 Presbyopia: Secondary | ICD-10-CM | POA: Diagnosis not present

## 2018-11-30 ENCOUNTER — Other Ambulatory Visit: Payer: Self-pay | Admitting: Family Medicine

## 2018-11-30 ENCOUNTER — Encounter: Payer: Self-pay | Admitting: Family Medicine

## 2018-12-21 DIAGNOSIS — Z0100 Encounter for examination of eyes and vision without abnormal findings: Secondary | ICD-10-CM | POA: Diagnosis not present

## 2019-01-06 ENCOUNTER — Encounter: Payer: Self-pay | Admitting: Family Medicine

## 2019-01-06 ENCOUNTER — Ambulatory Visit (INDEPENDENT_AMBULATORY_CARE_PROVIDER_SITE_OTHER): Payer: Medicare HMO | Admitting: Family Medicine

## 2019-01-06 VITALS — BP 132/70 | HR 63 | Temp 97.5°F | Ht 72.0 in | Wt 194.8 lb

## 2019-01-06 DIAGNOSIS — R300 Dysuria: Secondary | ICD-10-CM

## 2019-01-06 LAB — POCT URINALYSIS DIPSTICK
Bilirubin, UA: NEGATIVE
Blood, UA: NEGATIVE
Glucose, UA: NEGATIVE
Ketones, UA: NEGATIVE
LEUKOCYTES UA: NEGATIVE
NITRITE UA: NEGATIVE
PROTEIN UA: NEGATIVE
SPEC GRAV UA: 1.025 (ref 1.010–1.025)
Urobilinogen, UA: 0.2 E.U./dL
pH, UA: 6 (ref 5.0–8.0)

## 2019-01-06 NOTE — Progress Notes (Signed)
Patient: Wesley Fuller MRN: [id number] DOB: 09-26-1941 PCP: Marin Olp, MD     Subjective:  Chief Complaint  Patient presents with  . poss UTI    HPI: The patient is a 78 y.o. male who presents today for possible UTI. He states he has long history of urgency with urination, and had one episode of dysuria, but none since that time. He experienced this yesterday afternoon. He has frequency, but this is at his baseline. No blood in his urine and no flank pain. He has had no fever/chills, no pain with defecation. Denies weak or dribbling stream and doesn't have to strain to go to the bathroom. When I asked if he ate something out of the ordinary when he had his episode of dysuria he states they did eat hot peppers that day.   Review of Systems  Respiratory: Negative for shortness of breath.   Cardiovascular: Negative for chest pain.  Gastrointestinal: Negative for abdominal pain and nausea.  Genitourinary: Positive for frequency. Negative for dysuria, flank pain and hematuria.  Musculoskeletal: Negative for back pain.  Neurological: Negative for dizziness and headaches.    Allergies Patient has No Known Allergies.  Past Medical History Patient  has a past medical history of Allergy, Asthma, Calcium oxalate renal stones, Cataract (2013), Diverticulosis, ED (erectile dysfunction), GERD (gastroesophageal reflux disease), Hypertension, Melanoma of back ([place]) (1974), Nail fungal infection, and Osteoarthritis of left knee (03/02/2014).  Surgical History Patient  has a past surgical history that includes skin cancer; Tonsillectomy (1974); lymph node disection (1981); melanoma removal of back  (1974); left leg stripping; Hernia repair; and Nasal polyp surgery.  Family History Pateint's family history includes Colon cancer in his father, paternal uncle, and paternal uncle; Hypertension in his unknown relative.  Social History Patient  reports that he has never smoked. He has never used  smokeless tobacco. He reports current alcohol use of about 5.0 standard drinks of alcohol per week. He reports that he does not use drugs.    Objective: Vitals:   01/06/19 1143  BP: 132/70  Pulse: 63  Temp: (!) 97.5 F (36.4 C)  TempSrc: Oral  SpO2: 96%  Weight: 194 lb 12.8 oz (88.4 kg)  Height: 6' (1.829 m)    Body mass index is 26.42 kg/m.  Physical Exam Vitals signs reviewed.  Constitutional:      Appearance: Normal appearance.  Cardiovascular:     Rate and Rhythm: Normal rate and regular rhythm.     Heart sounds: Normal heart sounds.  Pulmonary:     Effort: Pulmonary effort is normal.     Breath sounds: Normal breath sounds.  Abdominal:     General: Abdomen is flat. Bowel sounds are normal. There is no distension.     Palpations: Abdomen is soft.     Tenderness: There is no abdominal tenderness. There is no right CVA tenderness or left CVA tenderness.  Neurological:     Mental Status: He is alert.  Psychiatric:        Mood and Affect: Mood normal.        Behavior: Behavior normal.    Depression screen [facility] 2/9 01/06/2019 06/01/2018 05/07/2018 04/28/2018 04/15/2017  Decreased Interest 0 1 0 2 0  Down, Depressed, Hopeless 1 1 0 1 0  PHQ - 2 Score 1 2 0 3 0  Altered sleeping - 0 - 1 -  Tired, decreased energy - 2 - 3 -  Change in appetite - 0 - 0 -  Feeling bad  or failure about yourself  - 0 - 1 -  Trouble concentrating - 1 - 1 -  Moving slowly or fidgety/restless - 0 - 0 -  Suicidal thoughts - 0 - 0 -  PHQ-9 Score - 5 - 9 -  Difficult doing work/chores - Not difficult at all - Somewhat difficult -   UA: normal     Assessment/plan: 1. Burning with urination No signs of infection and it was an isolated event. I think it could have been due to eating hot peppers. Will follow culture and treat if grows something, but otherwise discussed no indication for treatment at this point with normal UA and no symptoms. Let us know if anything changes.  - POCT Urinalysis  Dipstick - Urine Culture   No follow-ups on file.   Orma Flaming, MD [place]   01/06/2019

## 2019-01-06 NOTE — Patient Instructions (Signed)
No signs of infection on UA. It's completely normal. Will wait on results of culture. Could have had burning episode because you ate hot peppers. Just a thought. Make sure drinking water. Will let you know results of culture.

## 2019-01-07 LAB — URINE CULTURE
MICRO NUMBER: 59427
Result:: NO GROWTH
SPECIMEN QUALITY:: ADEQUATE

## 2019-01-28 DIAGNOSIS — M1712 Unilateral primary osteoarthritis, left knee: Secondary | ICD-10-CM | POA: Diagnosis not present

## 2019-01-28 DIAGNOSIS — M25562 Pain in left knee: Secondary | ICD-10-CM | POA: Insufficient documentation

## 2019-02-20 ENCOUNTER — Encounter: Payer: Self-pay | Admitting: Gastroenterology

## 2019-02-22 ENCOUNTER — Ambulatory Visit (INDEPENDENT_AMBULATORY_CARE_PROVIDER_SITE_OTHER): Payer: Medicare HMO | Admitting: Family Medicine

## 2019-02-22 ENCOUNTER — Encounter: Payer: Self-pay | Admitting: Family Medicine

## 2019-02-22 VITALS — BP 110/62 | HR 76 | Temp 98.2°F | Ht 72.0 in | Wt 196.2 lb

## 2019-02-22 DIAGNOSIS — I480 Paroxysmal atrial fibrillation: Secondary | ICD-10-CM

## 2019-02-22 DIAGNOSIS — R69 Illness, unspecified: Secondary | ICD-10-CM | POA: Diagnosis not present

## 2019-02-22 DIAGNOSIS — R351 Nocturia: Secondary | ICD-10-CM

## 2019-02-22 DIAGNOSIS — I10 Essential (primary) hypertension: Secondary | ICD-10-CM | POA: Diagnosis not present

## 2019-02-22 DIAGNOSIS — Z6826 Body mass index (BMI) 26.0-26.9, adult: Secondary | ICD-10-CM

## 2019-02-22 DIAGNOSIS — G3184 Mild cognitive impairment, so stated: Secondary | ICD-10-CM | POA: Diagnosis not present

## 2019-02-22 DIAGNOSIS — N401 Enlarged prostate with lower urinary tract symptoms: Secondary | ICD-10-CM

## 2019-02-22 DIAGNOSIS — E663 Overweight: Secondary | ICD-10-CM | POA: Diagnosis not present

## 2019-02-22 DIAGNOSIS — R739 Hyperglycemia, unspecified: Secondary | ICD-10-CM | POA: Diagnosis not present

## 2019-02-22 DIAGNOSIS — F320 Major depressive disorder, single episode, mild: Secondary | ICD-10-CM

## 2019-02-22 NOTE — Patient Instructions (Addendum)
Health Maintenance Due  Topic Date Due  . COLONOSCOPY - possibly before surgery? 02/10/2019   My note which is visible on epic shows you are medically maximized for surgery as long as labs are reassuring  Ok to hold xarelto 5 days before surgery  Please stop by lab before you go- just for a urine test If you do not have mychart- we will call you about results within 5 business days of [place] receiving them.  If you have mychart- we will send your results within 3 business days of [place] receiving them.  If abnormal or we want to clarify a result, we will call or mychart you to make sure you receive the message.  If you have questions or concerns or don't hear within 5-7 days, please send [place] a message or call us.

## 2019-02-22 NOTE — Progress Notes (Signed)
Phone [phone]   Subjective:  Wesley Fuller is a 78 y.o. year old very pleasant male patient who presents for/with See problem oriented charting ROS- No chest pain or shortness of breath. No headache or blurry vision.    Past Medical History-  Patient Active Problem List   Diagnosis Date Noted  . Paroxysmal atrial fibrillation ([place]) 08/05/2018    Priority: High  . MCI (mild cognitive impairment) 08/22/2015    Priority: High  . BPH associated with nocturia 02/22/2019    Priority: Medium  . Depression, major, single episode, mild ([place]) 06/01/2018    Priority: Medium  . Hyperglycemia 10/15/2016    Priority: Medium  . Essential hypertension 02/09/2008    Priority: Medium  . History of melanoma 02/09/2008    Priority: Medium  . Osteoarthritis of left knee 03/02/2014    Priority: Low  . VARICOSE VEINS LOWER EXTREMITIES W/INFLAMMATION 02/20/2010    Priority: Low  . GERD 11/21/2008    Priority: Low  . Actinic keratosis 10/12/2008    Priority: Low  . ERECTILE DYSFUNCTION 02/09/2008    Priority: Low  . RENAL CALCULUS, HX OF 02/09/2008    Priority: Low  . Low blood pressure reading 04/13/2018  . Depression 03/23/2018  . Asymmetric SNHL (sensorineural hearing loss) 04/11/2016    Medications- reviewed and updated Current Outpatient Medications  Medication Sig Dispense Refill  . amLODipine (NORVASC) 5 MG tablet TAKE ONE TABLET BY MOUTH ONE TIME DAILY or as directed 90 tablet 0  . donepezil (ARICEPT) 10 MG tablet Take 1 tablet (10 mg total) by mouth at bedtime. 90 tablet 3  . hydrochlorothiazide (HYDRODIURIL) 12.5 MG tablet Take 1 tablet (12.5 mg total) by mouth daily. 90 tablet 3  . irbesartan (AVAPRO) 300 MG tablet Take 1 tablet (300 mg total) by mouth daily. 90 tablet 3  . rivaroxaban (XARELTO) 20 MG TABS tablet Take 1 tablet (20 mg total) by mouth daily with supper. 90 tablet 2   No current facility-administered medications for this visit.      Objective:  BP 110/62  (BP Location: Left Arm, Patient Position: Sitting, Cuff Size: Large)   Pulse 76   Temp 98.2 F (36.8 C) (Oral)   Wt 196 lb 3.2 oz (89 kg)   SpO2 95%   BMI 26.61 kg/m  Gen: NAD, resting comfortably CV: RRR no murmurs rubs or gallops Lungs: CTAB no crackles, wheeze, rhonchi Abdomen: soft/nontender/nondistended Ext: no edema Skin: warm, dry Neuro: looks to wife for some answers, normal gait Rectal: normal tone, diffusely enlarged prostate, no masses or tenderness    Assessment and Plan   Preoperative evaluation  S: Patient has a planned total knee replacement on LEFT side with Dr. Wynelle Link upcoming 03/29/2019. He is able to easily complete 4 mets of activity without chest pain or shortness of breath. Exercises at the ACT at [facility].  A/P:Patient able to complete 4 METS of activity without issue and other medical problems are stable/controlled. I do not feel strongly that he needs to see cardiology before the visit  (is about due for 6 month follow up but he doesn't think he can get in before his surgery). I would consider him medically maximized for surgery - looks likes has anesthesiology labs planned before surgery as well including cbc, cmp which is reasonable- had slight bilirubin bump last labs.   #Hypertension/overweight S: Compliant with amlodipine 5 mg, hydrochlorothiazide 12.5 mg, irbesartan 300 mg. A/P: Stable. Continue current medications.   -Should continue efforts for healthy  eating, regular exercise in regards to weight-encouraged this.   #Atrial fibrillation S: Patient is compliant with Xarelto 20 mg for anticoagulation.  He did not tolerate metoprolol due to fatigue and low blood pressure.  He has had 1 visit with cardiology in September 2019- they suggested 17-month follow-up A/P:  Stable. Continue current medications. Appears to be In sinus rhythm today. They asked about possibly stopping xarelto for aspirin- with chadsvasc score of 3 did not recommend this. He is  ok to hold xarelto 5 days prior to surgery- we reviewed risk of 4.6% for tia/cva per year so holding 5 days only does not drastically increase risk- they also are interested in potentially doing colonoscopy before the knee surgery and I would be ok holding 5 days for that or even total of 10 days between 2 procedures.    #Mild cognitive impairment S: Compliant with donepezil 10 mg.  Follows up with Dr. [name]. Still waiting on [place] study to restart A/P: Stable. Continue current medications.     #Hyperglycemia S: Patient with elevated CBGs in the past but has not had elevated A1c's A/P: continue to monitor- looks likes has presurgical labs scheduled    BPH associated with nocturia  S: has noted some frequent urination as well as urgency. Slowly progressive over time- no recent change. Last PSA was 2016. Sees Dr. Alinda Money for kidney stones mainly.  A/P: exam concerning for BPH as cause. No nodules- at his age did not suggest repeat PSA. Will also get UA to evaluate for infection  Depression, major, single episode, mild ([place]) Has reported reasonable control even without antidepressant.  Did not tolerate Lexapro.  Updating PHQ 9 today  Future Appointments  Date Time Provider [place]  04/27/2019 10:00 AM Cameron Sprang, MD [facility] None   Return precautions advised.  Garret Reddish, MD

## 2019-02-22 NOTE — Assessment & Plan Note (Signed)
S: has noted some frequent urination as well as urgency. Slowly progressive over time- no recent change. Last PSA was 2016. Sees Dr. Alinda Money for kidney stones mainly.  A/P: exam concerning for BPH as cause. No nodules- at his age did not suggest repeat PSA. Will also get UA to evaluate for infection

## 2019-02-22 NOTE — Assessment & Plan Note (Signed)
Has reported reasonable control even without antidepressant.  Did not tolerate Lexapro.  Updating PHQ 9 today

## 2019-02-23 NOTE — Telephone Encounter (Signed)
Dr. Yong Channel, do you want pt to have urine done? Do you want to order just a UA or culture also?

## 2019-02-24 DIAGNOSIS — R69 Illness, unspecified: Secondary | ICD-10-CM | POA: Diagnosis not present

## 2019-02-25 ENCOUNTER — Other Ambulatory Visit: Payer: Self-pay | Admitting: Family Medicine

## 2019-03-01 ENCOUNTER — Telehealth: Payer: Self-pay | Admitting: Emergency Medicine

## 2019-03-01 NOTE — Telephone Encounter (Signed)
Left message for patient to return call regarding my chart message.

## 2019-03-02 DIAGNOSIS — M25562 Pain in left knee: Secondary | ICD-10-CM | POA: Diagnosis not present

## 2019-03-03 NOTE — H&P (Signed)
TOTAL KNEE ADMISSION H&P  Patient is being admitted for left total knee arthroplasty.  Subjective:  Chief Complaint:left knee pain.  HPI: Wesley Fuller, 78 y.o. male, has a history of pain and functional disability in the left knee due to arthritis and has failed non-surgical conservative treatments for greater than 12 weeks to includecorticosteriod injections, viscosupplementation injections and activity modification.  Onset of symptoms was gradual, starting 10 years ago with gradually worsening course since that time. The patient noted no past surgery on the left knee(s).  Patient currently rates pain in the left knee(s) at 8 out of 10 with activity. Patient has worsening of pain with activity and weight bearing, pain that interferes with activities of daily living and crepitus.  Patient has evidence of bone-on-bone arthritis of the medial and patellofemoral compartments with large osteophyte formation by imaging studies. There is no active infection.  Patient Active Problem List   Diagnosis Date Noted   BPH associated with nocturia 02/22/2019   Paroxysmal atrial fibrillation ([place]) 08/05/2018   Depression, major, single episode, mild ([place]) 06/01/2018   Low blood pressure reading 04/13/2018   Depression 03/23/2018   Hyperglycemia 10/15/2016   Asymmetric SNHL (sensorineural hearing loss) 04/11/2016   MCI (mild cognitive impairment) 08/22/2015   Osteoarthritis of left knee 03/02/2014   VARICOSE VEINS LOWER EXTREMITIES W/INFLAMMATION 02/20/2010   GERD 11/21/2008   Actinic keratosis 10/12/2008   ERECTILE DYSFUNCTION 02/09/2008   Essential hypertension 02/09/2008   History of melanoma 02/09/2008   RENAL CALCULUS, HX OF 02/09/2008   Past Medical History:  Diagnosis Date   Allergy    seasonal   Asthma    as a child   Calcium oxalate renal stones    Cataract 2013   Diverticulosis    ED (erectile dysfunction)    GERD (gastroesophageal reflux disease)     Hypertension    Melanoma of back ([place]) 1974   Nail fungal infection    Osteoarthritis of left knee 03/02/2014    Past Surgical History:  Procedure Laterality Date   HERNIA REPAIR     x 2   left leg stripping     lymph node disection  1981   melanoma removal of back   1974   NASAL POLYP SURGERY     skin cancer     TONSILLECTOMY  1974    No current facility-administered medications for this encounter.    Current Outpatient Medications  Medication Sig Dispense Refill Last Dose   amLODipine (NORVASC) 5 MG tablet TAKE ONE TABLET BY MOUTH ONE TIME DAILY OR AS DIRECTED  90 tablet 0    donepezil (ARICEPT) 10 MG tablet Take 1 tablet (10 mg total) by mouth at bedtime. 90 tablet 3 Taking   hydrochlorothiazide (HYDRODIURIL) 12.5 MG tablet Take 1 tablet (12.5 mg total) by mouth daily. 90 tablet 3 Taking   irbesartan (AVAPRO) 300 MG tablet Take 1 tablet (300 mg total) by mouth daily. 90 tablet 3 Taking   rivaroxaban (XARELTO) 20 MG TABS tablet Take 1 tablet (20 mg total) by mouth daily with supper. 90 tablet 2 Taking   No Known Allergies  Social History   Tobacco Use   Smoking status: Never Smoker   Smokeless tobacco: Never Used  Substance Use Topics   Alcohol use: Yes    Alcohol/week: 5.0 standard drinks    Types: 5 Glasses of wine per week    Comment: does not drink very much; glass of wine every day    Family History  Problem  Relation Age of Onset   Colon cancer Father    Colon cancer Paternal Uncle    Colon cancer Paternal Uncle    Hypertension Other      Review of Systems  Constitutional: Negative for chills and fever.  HENT: Negative for congestion, sore throat and tinnitus.   Eyes: Negative for double vision, photophobia and pain.  Respiratory: Negative for cough, shortness of breath and wheezing.   Cardiovascular: Negative for chest pain, palpitations and orthopnea.  Gastrointestinal: Negative for heartburn, nausea and vomiting.  Genitourinary:  Negative for dysuria, frequency and urgency.  Musculoskeletal: Positive for joint pain.  Neurological: Negative for dizziness, weakness and headaches.    Objective:  Physical Exam  Well nourished and well developed.  General: Alert and oriented x3, cooperative and pleasant, no acute distress.  Head: normocephalic, atraumatic, neck supple.  Eyes: EOMI.  Respiratory: breath sounds clear in all fields, no wheezing, rales, or rhonchi. Cardiovascular: Regular rate and rhythm, no murmurs, gallops or rubs.  Abdomen: non-tender to palpation and soft, normoactive bowel sounds. Musculoskeletal:  Left Knee Exam:  Minimal varus.  He has a large varicose vein just medial to the knee.  No effusion. No Swelling. Range of motion is 0-130 degrees.  Moderate crepitus on range of motion of the knee.  Minimal medial joint line tenderness No lateral joint line tenderness.  Stable knee.  Calves soft and nontender. Motor function intact in LE. Strength 5/5 LE bilaterally. Neuro: Distal pulses 2+. Sensation to light touch intact in LE.  Vital signs in last 24 hours: Blood pressure: 140/80 mmHg Pulse: 64 bpm  Labs:   Estimated body mass index is 26.61 kg/m as calculated from the following:   Height as of 02/22/19: 6' (1.829 m).   Weight as of 02/22/19: 89 kg.   Imaging Review Plain radiographs demonstrate severe degenerative joint disease of the left knee(s). The overall alignment ismild varus. The bone quality appears to be adequate for age and reported activity level.      Assessment/Plan:  End stage arthritis, left knee   The patient history, physical examination, clinical judgment of the provider and imaging studies are consistent with end stage degenerative joint disease of the left knee(s) and total knee arthroplasty is deemed medically necessary. The treatment options including medical management, injection therapy arthroscopy and arthroplasty were discussed at length. The risks and  benefits of total knee arthroplasty were presented and reviewed. The risks due to aseptic loosening, infection, stiffness, patella tracking problems, thromboembolic complications and other imponderables were discussed. The patient acknowledged the explanation, agreed to proceed with the plan and consent was signed. Patient is being admitted for inpatient treatment for surgery, pain control, PT, OT, prophylactic antibiotics, VTE prophylaxis, progressive ambulation and ADL's and discharge planning. The patient is planning to be discharged home.    Anticipated LOS equal to or greater than 2 midnights due to - Age 47 and older with one or more of the following:  - Obesity  - Expected need for hospital services (PT, OT, Nursing) required for safe  discharge  - Anticipated need for postoperative skilled nursing care or inpatient rehab  - Active co-morbidities: None OR   - Unanticipated findings during/Post Surgery: None  - Patient is a high risk of re-admission due to: None   Therapy Plans: outpatient therapy at [facility] Disposition: Home with wife Planned DVT Prophylaxis: Xarelto 20 mg daily (pt takes for atrial fibrillation) DME needed: Gilford Rile PCP: Garret Reddish, MD Cardiologist: Jyl Heinz, MD TXA: IV Allergies:  NKDA Anesthesia Concerns: None BMI: 27.1  - Patient was instructed on what medications to stop prior to surgery. - Follow-up visit in 2 weeks with Dr. Wynelle Link - Begin physical therapy following surgery - Pre-operative lab work as pre-surgical testing - Prescriptions will be provided in hospital at time of discharge  Theresa Duty, PA-C Orthopedic Surgery [facility] [place]

## 2019-03-15 ENCOUNTER — Ambulatory Visit: Payer: Medicare HMO | Admitting: Cardiology

## 2019-03-24 ENCOUNTER — Encounter: Payer: Self-pay | Admitting: Family Medicine

## 2019-03-29 ENCOUNTER — Ambulatory Visit: Admit: 2019-03-29 | Payer: Medicare HMO | Admitting: Orthopedic Surgery

## 2019-03-29 SURGERY — ARTHROPLASTY, KNEE, TOTAL
Anesthesia: Choice | Laterality: Left

## 2019-04-02 ENCOUNTER — Telehealth: Payer: Self-pay

## 2019-04-02 NOTE — Telephone Encounter (Signed)
Patient appt cancelled because he needs pre op clearance from Dr.RRR. P.[place] put pt on 30 day recall list to call back when COVID 19 protocols are lifted.

## 2019-04-06 DIAGNOSIS — M1712 Unilateral primary osteoarthritis, left knee: Secondary | ICD-10-CM | POA: Diagnosis not present

## 2019-04-09 ENCOUNTER — Ambulatory Visit: Payer: Medicare HMO | Admitting: Cardiology

## 2019-04-16 ENCOUNTER — Other Ambulatory Visit: Payer: Self-pay

## 2019-04-22 ENCOUNTER — Other Ambulatory Visit: Payer: Self-pay

## 2019-04-23 ENCOUNTER — Other Ambulatory Visit: Payer: Self-pay

## 2019-04-23 ENCOUNTER — Telehealth (INDEPENDENT_AMBULATORY_CARE_PROVIDER_SITE_OTHER): Payer: Medicare HMO | Admitting: Neurology

## 2019-04-23 VITALS — BP 134/78 | HR 63

## 2019-04-23 DIAGNOSIS — F03A0 Unspecified dementia, mild, without behavioral disturbance, psychotic disturbance, mood disturbance, and anxiety: Secondary | ICD-10-CM

## 2019-04-23 DIAGNOSIS — R69 Illness, unspecified: Secondary | ICD-10-CM | POA: Diagnosis not present

## 2019-04-23 DIAGNOSIS — F0390 Unspecified dementia without behavioral disturbance: Secondary | ICD-10-CM | POA: Diagnosis not present

## 2019-04-23 MED ORDER — DONEPEZIL HCL 10 MG PO TABS: 10.0000 mg | ORAL_TABLET | Freq: Every day | ORAL | 3 refills | Status: DC

## 2019-04-23 NOTE — Progress Notes (Signed)
Virtual Visit via Video Note The purpose of this virtual visit is to provide medical care while limiting exposure to the novel coronavirus.    Consent was obtained for video visit:  Yes.   Answered questions that patient had about telehealth interaction:  Yes.   I discussed the limitations, risks, security and privacy concerns of performing an evaluation and management service by telemedicine. I also discussed with the patient that there may be a patient responsible charge related to this service. The patient expressed understanding and agreed to proceed.  Pt location: Home Physician Location: office Name of referring provider:  Marin Olp, MD I connected with Wesley Fuller at patients initiation/request on 04/23/2019 at  2:00 PM EDT by video enabled telemedicine application and verified that I am speaking with the correct person using two identifiers. Pt MRN:  [id number] Pt DOB:  November 25, 1941 Video Participants:  Wesley Fuller;  Joycelyn Rua (spouse)   History of Present Illness:  The patient was last seen in September 2019 for mild dementia. His wife is present during this e-visit to provide additional information. Since his last visit, he feels he is doing fine, his wife states that with their self-isolation during this pandemic, she has noticed more changes, nothing dramatic but "different." He was wonderful over the Christmas holidays with a lot of social interactions. He continues to drive without getting lost. He manages his own medications and does well per wife. His wife manages finances. His wife contacted our office previously about mood changes and asking about a different antidepressant (side effects on Lexapro). They both report that mood is better, they are both probably a little depressed with current pandemic, he gets moody some days. No paranoia or hallucinations. He has had 2 falls with no injuries. He denies any headaches, dizziness, vision changes, focal  numbness/tingling. His knee is bothering him but surgery has been postponed, he has difficulties walking long distances. Sleep is good. He is independent with dressing and bathing. He has a lot of back pain. He is taking Donepezil 10mg  daily without side effects. They are happy to report that the [place] study for Aducanumab (which he did well on) has been brought back and he will be part of the full dosage arm once facility opens up.    History on Initial Assessment 11/29/2015: This is a very pleasant 78 year old right-handed man with a history of hypertension, presenting for evaluation of worsening memory. He started noticing memory changes over the past 9-12 months, however his wife reports noticing changes around 2-1/2 years ago. He mostly notices difficulties with name and word retrieval, as well as misplacing things at home. His wife reports that they were playing bridge 2-1/2 years ago and he got confused with what cards to play. He does not recall this incident. Another time they were visiting their son in [place] and kept changing locations, but he would be confused that whole week as to what day it was or could not recall where they should be. Last September, he was driving out of state for a golf game that he attends once a year, but became so anxious about his drive and uncertain despite being given directions. His wife states it is out of character for him to be anxious. His wife reports that despite writing things down, he would get confused if he said yes to a commitment. He is more unsure of himself on the computer and cellphone, asking his wife to check if an email  he wrote looked okay. He has occasional word-finding difficulties and has always had problems multitasking. He denies getting lost driving, no missed medications or bill payments, although his wife pays majority of bills. He reports an odd instance 2-1/2 weeks ago while out of town, he went to bed early and thought he was dreaming that he  was in his house but it looked different, he got up and asked people what was going on, thinking he was still dreaming, but was later on told by friends that he did come and ask them this. He was more tired that day from driving long distance. His wife reports that she notices more confusion and memory issues when he is tired. MMSE at PCP office last 08/22/15 was 27/30. He was offered Aricept but did not start this, instead they have been to [facility] for consideration of joining a research study. He underwent neurocognitive testing but they have not heard results yet.  He denies any headaches, dizziness, diplopia, dysarthria, dysphagia, neck/back pain, focal numbness/tingling/weakness, tremors, no falls. He has a change in stool consistency. He reports chronic poor sense of smell. No REM behavior disorder. He denies any significant head injuries. His brother has Parkinson's dementia and had a good response to Aricept. He drinks one glass of wine a night, 1-2 drinks occasionally on weekends.  Observations/Objective:   Vitals:   04/23/19 1350  BP: 134/78  Pulse: 63   GEN:  The patient appears stated age and is in NAD. Patient is awake, alert, oriented x 3. No aphasia or dysarthria. Intact fluency and comprehension. Remote and recent memory impaired. Able to name and repeat. Cranial nerves: Extraocular movements intact with no nystagmus. No facial asymmetry. Motor: moves all extremities symmetrically, at least anti-gravity x 4. No incoordination on finger to nose testing. Gait: narrow-based and steady, able to tandem walk adequately. Negative Romberg test.  Montreal Cognitive Assessment  04/23/2019 08/31/2018 11/29/2015  Visuospatial/ Executive (0/5) 4 3 5   Naming (0/3) 3 3 3   Attention: Read list of digits (0/2) 2 1 2   Attention: Read list of letters (0/1) 1 1 1   Attention: Serial 7 subtraction starting at 100 (0/3) 3 2 3   Language: Repeat phrase (0/2) 2 2 2   Language : Fluency (0/1) 1 1 1     Abstraction (0/2) 2 2 2   Delayed Recall (0/5) 2 2 2   Orientation (0/6) 6 5 6   Total 26 22 27   Adjusted Score (based on education) - - 27   Assessment and Plan:    This is a very pleasant 78 yo RH man with hypertension, atrial fibrillation, and mild dementia. MOCA score today 26/30 (22/30 in Sept 2019, 27/30 in December 2016). Continue Donepezil 10mg  daily. They are looking forward to resuming being part of the [place] study for dementia at [facility]. Continue to monitor driving. We again discussed the importance of control of vascular risk factors, physical exercise, and brain stimulation exercises for brain health. He will follow-up in 6 months and knows to call for any changes.   Follow Up Instructions:   -I discussed the assessment and treatment plan with the patient. The patient was provided an opportunity to ask questions and all were answered. The patient agreed with the plan and demonstrated an understanding of the instructions.   The patient was advised to call back or seek an in-person evaluation if the symptoms worsen or if the condition fails to improve as anticipated.   Cameron Sprang, MD

## 2019-04-27 ENCOUNTER — Ambulatory Visit: Payer: Medicare HMO | Admitting: Neurology

## 2019-04-27 ENCOUNTER — Encounter

## 2019-05-07 ENCOUNTER — Telehealth: Payer: Self-pay | Admitting: *Deleted

## 2019-05-07 NOTE — Telephone Encounter (Signed)
Left message to call back and schedule appointment with Dr. Geraldo Pitter regarding cardiac clearance.

## 2019-05-10 ENCOUNTER — Telehealth: Payer: Self-pay | Admitting: Cardiology

## 2019-05-10 NOTE — Telephone Encounter (Signed)
Virtual Visit Pre-Appointment Phone Call  "(Name), I am calling you today to discuss your upcoming appointment. We are currently trying to limit exposure to the virus that causes COVID-19 by seeing patients at home rather than in the office."  1. "What is the BEST phone number to call the day of the visit?" - include this in appointment notes  2. Do you have or have access to (through a family member/friend) a smartphone with video capability that we can use for your visit?" a. If yes - list this number in appt notes as cell (if different from BEST phone #) and list the appointment type as a VIDEO visit in appointment notes b. If no - list the appointment type as a PHONE visit in appointment notes  3. Confirm consent - "In the setting of the current Covid19 crisis, you are scheduled for a (phone or video) visit with your provider on (date) at (time).  Just as we do with many in-office visits, in order for you to participate in this visit, we must obtain consent.  If you'd like, I can send this to your mychart (if signed up) or email for you to review.  Otherwise, I can obtain your verbal consent now.  All virtual visits are billed to your insurance company just like a normal visit would be.  By agreeing to a virtual visit, we'd like you to understand that the technology does not allow for your provider to perform an examination, and thus may limit your provider's ability to fully assess your condition. If your provider identifies any concerns that need to be evaluated in person, we will make arrangements to do so.  Finally, though the technology is pretty good, we cannot assure that it will always work on either your or our end, and in the setting of a video visit, we may have to convert it to a phone-only visit.  In either situation, we cannot ensure that we have a secure connection.  Are you willing to proceed?" STAFF: Did the patient verbally acknowledge consent to telehealth visit? Document  YES/NO here: YES  4. Advise patient to be prepared - "Two hours prior to your appointment, go ahead and check your blood pressure, pulse, oxygen saturation, and your weight (if you have the equipment to check those) and write them all down. When your visit starts, your provider will ask you for this information. If you have an Apple Watch or Kardia device, please plan to have heart rate information ready on the day of your appointment. Please have a pen and paper handy nearby the day of the visit as well."  5. Give patient instructions for MyChart download to smartphone OR Doximity/Doxy.me as below if video visit (depending on what platform provider is using)  6. Inform patient they will receive a phone call 15 minutes prior to their appointment time (may be from unknown caller ID) so they should be prepared to answer    TELEPHONE CALL NOTE  Wesley Fuller has been deemed a candidate for a follow-up tele-health visit to limit community exposure during the Covid-19 pandemic. I spoke with the patient via phone to ensure availability of phone/video source, confirm preferred email & phone number, and discuss instructions and expectations.  I reminded Wesley Fuller to be prepared with any vital sign and/or heart rhythm information that could potentially be obtained via home monitoring, at the time of his visit. I reminded Wesley Fuller to expect a phone call prior to  his visit.  Wesley Fuller 05/10/2019 9:08 AM   INSTRUCTIONS FOR DOWNLOADING THE MYCHART APP TO SMARTPHONE  - The patient must first make sure to have activated MyChart and know their login information - If Apple, go to [employer] and type in MyChart in the search bar and download the app. If Android, ask patient to go to [employer] and type in Bonnie in the search bar and download the app. The app is free but as with any other app downloads, their phone may require them to verify saved payment information or Apple/Android  password.  - The patient will need to then log into the app with their MyChart username and password, and select [facility] as their healthcare provider to link the account. When it is time for your visit, go to the MyChart app, find appointments, and click Begin Video Visit. Be sure to Select Allow for your device to access the Microphone and Camera for your visit. You will then be connected, and your provider will be with you shortly.  **If they have any issues connecting, or need assistance please contact MyChart service desk [phone])**  **If using a computer, in order to ensure the best quality for their visit they will need to use either of the following Internet Browsers: [employer], or Google Chrome**  IF USING DOXIMITY or DOXY.ME - The patient will receive a link just prior to their visit by text.     FULL LENGTH CONSENT FOR TELE-HEALTH VISIT   I hereby voluntarily request, consent and authorize [facility] and its employed or contracted physicians, physician assistants, nurse practitioners or other licensed health care professionals (the Practitioner), to provide me with telemedicine health care services (the Services") as deemed necessary by the treating Practitioner. I acknowledge and consent to receive the Services by the Practitioner via telemedicine. I understand that the telemedicine visit will involve communicating with the Practitioner through live audiovisual communication technology and the disclosure of certain medical information by electronic transmission. I acknowledge that I have been given the opportunity to request an in-person assessment or other available alternative prior to the telemedicine visit and am voluntarily participating in the telemedicine visit.  I understand that I have the right to withhold or withdraw my consent to the use of telemedicine in the course of my care at any time, without affecting my right to future care or treatment,  and that the Practitioner or I may terminate the telemedicine visit at any time. I understand that I have the right to inspect all information obtained and/or recorded in the course of the telemedicine visit and may receive copies of available information for a reasonable fee.  I understand that some of the potential risks of receiving the Services via telemedicine include:   Delay or interruption in medical evaluation due to technological equipment failure or disruption;  Information transmitted may not be sufficient (e.g. poor resolution of images) to allow for appropriate medical decision making by the Practitioner; and/or   In rare instances, security protocols could fail, causing a breach of personal health information.  Furthermore, I acknowledge that it is my responsibility to provide information about my medical history, conditions and care that is complete and accurate to the best of my ability. I acknowledge that Practitioner's advice, recommendations, and/or decision may be based on factors not within their control, such as incomplete or inaccurate data provided by me or distortions of diagnostic images or specimens that may result from electronic transmissions. I  understand that the practice of medicine is not an exact science and that Practitioner makes no warranties or guarantees regarding treatment outcomes. I acknowledge that I will receive a copy of this consent concurrently upon execution via email to the email address I last provided but may also request a printed copy by calling the office of [facility].    I understand that my insurance will be billed for this visit.   I have read or had this consent read to me.  I understand the contents of this consent, which adequately explains the benefits and risks of the Services being provided via telemedicine.   I have been provided ample opportunity to ask questions regarding this consent and the Services and have had my questions  answered to my satisfaction.  I give my informed consent for the services to be provided through the use of telemedicine in my medical care  By participating in this telemedicine visit I agree to the above.

## 2019-05-10 NOTE — H&P (Signed)
TOTAL KNEE ADMISSION H&P  Patient is being admitted for left total knee arthroplasty.  Subjective:  Chief Complaint:left knee pain.  HPI: Wesley Fuller, 78 y.o. male, has a history of pain and functional disability in the left knee due to arthritis and has failed non-surgical conservative treatments for greater than 12 weeks to includecorticosteriod injections, viscosupplementation injections and activity modification.  Onset of symptoms was gradual, starting 5 years ago with gradually worsening course since that time. The patient noted no past surgery on the left knee(s).  Patient currently rates pain in the left knee(s) at 6 out of 10 with activity. Patient has worsening of pain with activity and weight bearing, pain that interferes with activities of daily living and crepitus.  Patient has evidence of bone-on-bone arthritis of the medial and patellofemoral compartments with large osteophyte formation by imaging studies. There is no active infection.  Patient Active Problem List   Diagnosis Date Noted   BPH associated with nocturia 02/22/2019   Pain in left knee 01/28/2019   Paroxysmal atrial fibrillation ([place]) 08/05/2018   Depression, major, single episode, mild ([place]) 06/01/2018   Low blood pressure reading 04/13/2018   Depression 03/23/2018   Hyperglycemia 10/15/2016   Asymmetric SNHL (sensorineural hearing loss) 04/11/2016   MCI (mild cognitive impairment) 08/22/2015   Osteoarthritis of left knee 03/02/2014   VARICOSE VEINS LOWER EXTREMITIES W/INFLAMMATION 02/20/2010   GERD 11/21/2008   Actinic keratosis 10/12/2008   ERECTILE DYSFUNCTION 02/09/2008   Essential hypertension 02/09/2008   History of melanoma 02/09/2008   RENAL CALCULUS, HX OF 02/09/2008   Past Medical History:  Diagnosis Date   Allergy    seasonal   Asthma    as a child   Calcium oxalate renal stones    Cataract 2013   Diverticulosis    ED (erectile dysfunction)    GERD  (gastroesophageal reflux disease)    Hypertension    Melanoma of back ([place]) 1974   Nail fungal infection    Osteoarthritis of left knee 03/02/2014    Past Surgical History:  Procedure Laterality Date   HERNIA REPAIR     x 2   left leg stripping     lymph node disection  1981   melanoma removal of back   1974   NASAL POLYP SURGERY     skin cancer     TONSILLECTOMY  1974    No current facility-administered medications for this encounter.    Current Outpatient Medications  Medication Sig Dispense Refill Last Dose   amLODipine (NORVASC) 5 MG tablet TAKE ONE TABLET BY MOUTH ONE TIME DAILY OR AS DIRECTED  90 tablet 0 Taking   Astaxanthin 4 MG CAPS astaxanthin   Taking   donepezil (ARICEPT) 10 MG tablet Take 1 tablet (10 mg total) by mouth at bedtime. 90 tablet 3    hydrochlorothiazide (HYDRODIURIL) 12.5 MG tablet Take 1 tablet (12.5 mg total) by mouth daily. 90 tablet 3 Taking   irbesartan (AVAPRO) 300 MG tablet Take 1 tablet (300 mg total) by mouth daily. 90 tablet 3 Taking   rivaroxaban (XARELTO) 20 MG TABS tablet Take 1 tablet (20 mg total) by mouth daily with supper. 90 tablet 2 Taking   No Known Allergies  Social History   Tobacco Use   Smoking status: Never Smoker   Smokeless tobacco: Never Used  Substance Use Topics   Alcohol use: Yes    Alcohol/week: 5.0 standard drinks    Types: 5 Glasses of wine per week    Comment:  does not drink very much; glass of wine every day    Family History  Problem Relation Age of Onset   Colon cancer Father    Colon cancer Paternal Uncle    Colon cancer Paternal Uncle    Hypertension Other      Review of Systems  Constitutional: Negative for chills and fever.  HENT: Negative for congestion, sore throat and tinnitus.   Eyes: Negative for double vision, photophobia and pain.  Respiratory: Negative for cough, shortness of breath and wheezing.   Cardiovascular: Negative for chest pain, palpitations and orthopnea.    Gastrointestinal: Negative for heartburn, nausea and vomiting.  Genitourinary: Negative for dysuria, frequency and urgency.  Musculoskeletal: Positive for joint pain.  Neurological: Negative for dizziness, weakness and headaches.    Objective:  Physical Exam  Well nourished and well developed.  General: Alert and oriented x3, cooperative and pleasant, no acute distress.  Head: normocephalic, atraumatic, neck supple.  Eyes: EOMI.  Respiratory: breath sounds clear in all fields, no wheezing, rales, or rhonchi. Cardiovascular: Regular rate and rhythm, no murmurs, gallops or rubs.  Abdomen: non-tender to palpation and soft, normoactive bowel sounds. Musculoskeletal:  Left Knee Exam:  Minimal varus.  He has a large varicose vein just medial to the knee.  No effusion. No Swelling. Range of motion is 0-130 degrees.  Moderate crepitus on range of motion of the knee.  Minimal medial joint line tenderness No lateral joint line tenderness.  Stable knee.  Calves soft and nontender. Motor function intact in LE. Strength 5/5 LE bilaterally. Neuro: Distal pulses 2+. Sensation to light touch intact in LE.  Vital signs in last 24 hours: Blood pressure: 144/80 mmHg Pulse: 68 bpm  Labs:   Estimated body mass index is 26.31 kg/m as calculated from the following:   Height as of 04/22/19: 6' (1.829 m).   Weight as of 04/22/19: 88 kg.   Imaging Review Plain radiographs demonstrate severe degenerative joint disease of the left knee(s). The overall alignment ismild varus. The bone quality appears to be adequate for age and reported activity level.      Assessment/Plan:  End stage arthritis, left knee   The patient history, physical examination, clinical judgment of the provider and imaging studies are consistent with end stage degenerative joint disease of the left knee(s) and total knee arthroplasty is deemed medically necessary. The treatment options including medical management,  injection therapy arthroscopy and arthroplasty were discussed at length. The risks and benefits of total knee arthroplasty were presented and reviewed. The risks due to aseptic loosening, infection, stiffness, patella tracking problems, thromboembolic complications and other imponderables were discussed. The patient acknowledged the explanation, agreed to proceed with the plan and consent was signed. Patient is being admitted for inpatient treatment for surgery, pain control, PT, OT, prophylactic antibiotics, VTE prophylaxis, progressive ambulation and ADL's and discharge planning. The patient is planning to be discharged home.    Anticipated LOS equal to or greater than 2 midnights due to - Age 65 and older with one or more of the following:  - Obesity  - Expected need for hospital services (PT, OT, Nursing) required for safe  discharge  - Anticipated need for postoperative skilled nursing care or inpatient rehab  - Active co-morbidities: Cardiac Arrhythmia OR   - Unanticipated findings during/Post Surgery: None  - Patient is a high risk of re-admission due to: None   Therapy Plans: outpatient therapy at [facility] Disposition: Home with wife Planned DVT Prophylaxis: Xarelto 20 mg daily (  pt takes for atrial fibrillation) DME needed: None PCP: Garret Reddish, MD Cardiologist: Jyl Heinz, MD TXA: IV Allergies: NKDA Anesthesia Concerns: None BMI: 27.1  - Patient was instructed on what medications to stop prior to surgery. - Follow-up visit in 2 weeks with Dr. Wynelle Link - Begin physical therapy following surgery - Pre-operative lab work as pre-surgical testing - Prescriptions will be provided in hospital at time of discharge  Theresa Duty, PA-C Orthopedic Surgery [facility] [place]

## 2019-05-12 ENCOUNTER — Other Ambulatory Visit: Payer: Self-pay

## 2019-05-12 ENCOUNTER — Telehealth (INDEPENDENT_AMBULATORY_CARE_PROVIDER_SITE_OTHER): Payer: Medicare HMO | Admitting: Cardiology

## 2019-05-12 ENCOUNTER — Encounter: Payer: Self-pay | Admitting: Cardiology

## 2019-05-12 VITALS — BP 134/74 | HR 60 | Temp 96.9°F | Ht 72.0 in | Wt 195.0 lb

## 2019-05-12 DIAGNOSIS — I480 Paroxysmal atrial fibrillation: Secondary | ICD-10-CM

## 2019-05-12 DIAGNOSIS — Z01810 Encounter for preprocedural cardiovascular examination: Secondary | ICD-10-CM

## 2019-05-12 DIAGNOSIS — I10 Essential (primary) hypertension: Secondary | ICD-10-CM

## 2019-05-12 NOTE — Addendum Note (Signed)
Addended by: Beckey Rutter on: 05/12/2019 03:12 PM   Modules accepted: Orders

## 2019-05-12 NOTE — Patient Instructions (Signed)
Medication Instructions:  Your physician recommends that you continue on your current medications as directed. Please refer to the Current Medication list given to you today.  If you need a refill on your cardiac medications before your next appointment, please call your pharmacy.   Lab work: NONE If you have labs (blood work) drawn today and your tests are completely normal, you will receive your results only by: Marland Kitchen MyChart Message (if you have MyChart) OR . A paper copy in the mail If you have any lab test that is abnormal or we need to change your treatment, we will call you to review the results.  Testing/Procedures: You will need an EKG performed.   Your physician has requested that you have a lexiscan myoview. For further information please visit [email or web address]. Please follow instruction sheet, as given.    [facility] at [facility] [address] [place], [place] [address] Phone: [phone]  May 12, 2019    Wesley Fuller DOB: October 03, 1941 MRN: [id number] [place] [id number]   Dear Wesley Fuller, Fuller are scheduled for a Myocardial Perfusion Imaging Study on:  05/19/2019 at [place].  Please arrive 15 minutes prior to your appointment time for registration and insurance purposes.  The test will take approximately 3 to 4 hours to complete; you may bring reading material.  If someone comes with you to your appointment, they will need to remain in the main lobby due to limited space in the testing area. **If you are pregnant or breastfeeding, please notify the nuclear lab prior to your appointment**  How to prepare for your Myocardial Perfusion Test: . Do not eat or drink 3 hours prior to your test, except you may have water. . Do not consume products containing caffeine (regular or decaffeinated) 12 hours prior to your test. (ex: coffee, chocolate, sodas, tea). Do bring a list of your current medications with you.  If  not listed below, you may take your medications as normal. . Do not take HCTZ prior to the test.  Bring the medication to your appointment as you may be required to take it once the test is complete. . Do wear comfortable clothes (no dresses or overalls) and walking shoes, tennis shoes preferred (No heels or open toe shoes are allowed). . Do NOT wear cologne, perfume, aftershave, or lotions (deodorant is allowed). . If these instructions are not followed, your test will have to be rescheduled.  Please report to [address], [address] for your test.  If you have questions or concerns about your appointment, you can call the Nuclear Lab at [phone].  If you cannot keep your appointment, please provide 24 hours notification to the Nuclear Lab, to avoid a possible $50 charge to your account.  Follow-Up: At [facility], you and your health needs are our priority.  As part of our continuing mission to provide you with exceptional heart care, we have created designated Provider Care Teams.  These Care Teams include your primary Cardiologist (physician) and Advanced Practice Providers (APPs -  Physician Assistants and Nurse Practitioners) who all work together to provide you with the care you need, when you need it. You will need a follow up appointment as needed with Dr. Geraldo Pitter.   Any Other Special Instructions Will Be Listed Below Regadenoson injection What is this medicine? REGADENOSON is used to test the heart for coronary artery disease. It is used in patients who can not exercise for their  stress test. This medicine may be used for other purposes; ask your health care provider or pharmacist if you have questions. COMMON BRAND NAME(S): Lexiscan What should I tell my health care provider before I take this medicine? They need to know if you have any of these conditions: -heart problems -lung or breathing disease, like asthma or COPD -an unusual or allergic reaction to  regadenoson, other medicines, foods, dyes, or preservatives -pregnant or trying to get pregnant -breast-feeding How should I use this medicine? This medicine is for injection into a vein. It is given by a health care professional in a hospital or clinic setting. Talk to your pediatrician regarding the use of this medicine in children. Special care may be needed. Overdosage: If you think you have taken too much of this medicine contact a poison control center or emergency room at once. NOTE: This medicine is only for you. Do not share this medicine with others. What if I miss a dose? This does not apply. What may interact with this medicine? -caffeine -dipyridamole -guarana -theophylline This list may not describe all possible interactions. Give your health care provider a list of all the medicines, herbs, non-prescription drugs, or dietary supplements you use. Also tell them if you smoke, drink alcohol, or use illegal drugs. Some items may interact with your medicine. What should I watch for while using this medicine? Your condition will be monitored carefully while you are receiving this medicine. Do not take medicines, foods, or drinks with caffeine (like coffee, tea, or colas) for at least 12 hours before your test. If you do not know if something contains caffeine, ask your health care professional. What side effects may I notice from receiving this medicine? Side effects that you should report to your doctor or health care professional as soon as possible: -allergic reactions like skin rash, itching or hives, swelling of the face, lips, or tongue -breathing problems -chest pain, tightness or palpitations -severe headache Side effects that usually do not require medical attention (report to your doctor or health care professional if they continue or are bothersome): -flushing -headache -irritation or pain at site where injected -nausea, vomiting This list may not describe all  possible side effects. Call your doctor for medical advice about side effects. You may report side effects to FDA at 1-800-FDA-1088. Where should I keep my medicine? This drug is given in a hospital or clinic and will not be stored at home. NOTE: This sheet is a summary. It may not cover all possible information. If you have questions about this medicine, talk to your doctor, pharmacist, or health care provider.  2019 Elsevier/Gold Standard (2008-08-08 15:08:13)   Cardiac Nuclear Scan A cardiac nuclear scan is a test that is done to check the flow of blood to your heart. It is done when you are resting and when you are exercising. The test looks for problems such as:  Not enough blood reaching a portion of the heart.  The heart muscle not working as it should. You may need this test if:  You have heart disease.  You have had lab results that are not normal.  You have had heart surgery or a balloon procedure to open up blocked arteries (angioplasty).  You have chest pain.  You have shortness of breath. In this test, a special dye (tracer) is put into your bloodstream. The tracer will travel to your heart. A camera will then take pictures of your heart to see how the tracer moves through your  heart. This test is usually done at a hospital and takes 2-4 hours. Tell a doctor about:  Any allergies you have.  All medicines you are taking, including vitamins, herbs, eye drops, creams, and over-the-counter medicines.  Any problems you or family members have had with anesthetic medicines.  Any blood disorders you have.  Any surgeries you have had.  Any medical conditions you have.  Whether you are pregnant or may be pregnant. What are the risks? Generally, this is a safe test. However, problems may occur, such as:  Serious chest pain and heart attack. This is only a risk if the stress portion of the test is done.  Rapid heartbeat.  A feeling of warmth in your chest. This  feeling usually does not last long.  Allergic reaction to the tracer. What happens before the test?  Ask your doctor about changing or stopping your normal medicines. This is important.  Follow instructions from your doctor about what you cannot eat or drink.  Remove your jewelry on the day of the test. What happens during the test?  An IV tube will be inserted into one of your veins.  Your doctor will give you a small amount of tracer through the IV tube.  You will wait for 20-40 minutes while the tracer moves through your bloodstream.  Your heart will be monitored with an electrocardiogram (ECG).  You will lie down on an exam table.  Pictures of your heart will be taken for about 15-20 minutes.  You may also have a stress test. For this test, one of these things may be done: ? You will be asked to exercise on a treadmill or a stationary bike. ? You will be given medicines that will make your heart work harder. This is done if you are unable to exercise.  When blood flow to your heart has peaked, a tracer will again be given through the IV tube.  After 20-40 minutes, you will get back on the exam table. More pictures will be taken of your heart.  Depending on the tracer that is used, more pictures may need to be taken 3-4 hours later.  Your IV tube will be removed when the test is over. The test may vary among doctors and hospitals. What happens after the test?  Ask your doctor: ? Whether you can return to your normal schedule, including diet, activities, and medicines. ? Whether you should drink more fluids. This will help to remove the tracer from your body. Drink enough fluid to keep your pee (urine) pale yellow.  Ask your doctor, or the department that is doing the test: ? When will my results be ready? ? How will I get my results? Summary  A cardiac nuclear scan is a test that is done to check the flow of blood to your heart.  Tell your doctor whether you are  pregnant or may be pregnant.  Before the test, ask your doctor about changing or stopping your normal medicines. This is important.  Ask your doctor whether you can return to your normal activities. You may be asked to drink more fluids. This information is not intended to replace advice given to you by your health care provider. Make sure you discuss any questions you have with your health care provider. Document Released: 05/25/2018 Document Revised: 05/25/2018 Document Reviewed: 05/25/2018 Elsevier Interactive Patient Education  2019 [employer].

## 2019-05-12 NOTE — Progress Notes (Signed)
Virtual Visit via Video Note   This visit type was conducted due to national recommendations for restrictions regarding the COVID-19 Pandemic (e.g. social distancing) in an effort to limit this patient's exposure and mitigate transmission in our community.  Due to his co-morbid illnesses, this patient is at least at moderate risk for complications without adequate follow up.  This format is felt to be most appropriate for this patient at this time.  All issues noted in this document were discussed and addressed.  A limited physical exam was performed with this format.  Please refer to the patient's chart for his consent to telehealth for [facility].   Date:  05/12/2019   ID:  Wesley Fuller, DOB 1941-08-05, MRN [id number]  Patient Location: Home Provider Location: Home  PCP:  Marin Olp, MD  Cardiologist:  No primary care provider on file.  Electrophysiologist:  None   Evaluation Performed:  Follow-Up Visit  Chief Complaint: Preoperative cardiovascular stratification  History of Present Illness:    Wesley Fuller is a 78 y.o. male with past medical history of essential hypertension and paroxysmal atrial fibrillation.  He denies any problems at this time and takes care of activities of daily living.  No chest pain orthopnea or PND.  He leads a very sedentary lifestyle because of orthopedic issues involving his knee and he is planning to undergo knee replacement surgery.  At the time of my evaluation, the patient is alert awake oriented and in no distress.  The patient does not have symptoms concerning for COVID-19 infection (fever, chills, cough, or new shortness of breath).    Past Medical History:  Diagnosis Date  . Allergy    seasonal  . Asthma    as a child  . Calcium oxalate renal stones   . Cataract 2013  . Diverticulosis   . ED (erectile dysfunction)   . GERD (gastroesophageal reflux disease)   . Hypertension   . Melanoma of back ([place]) 1974  . Nail fungal  infection   . Osteoarthritis of left knee 03/02/2014   Past Surgical History:  Procedure Laterality Date  . HERNIA REPAIR     x 2  . left leg stripping    . lymph node disection  1981  . melanoma removal of back   1974  . NASAL POLYP SURGERY    . skin cancer    . TONSILLECTOMY  1974     Current Meds  Medication Sig  . amLODipine (NORVASC) 5 MG tablet TAKE ONE TABLET BY MOUTH ONE TIME DAILY OR AS DIRECTED   . Astaxanthin 4 MG CAPS astaxanthin  . calcium acetate (PHOSLO) 667 MG capsule Take by mouth 3 (three) times daily with meals.  . donepezil (ARICEPT) 10 MG tablet Take 1 tablet (10 mg total) by mouth at bedtime.  Marland Kitchen glucosamine-chondroitin 500-400 MG tablet Take 1 tablet by mouth daily.  . hydrochlorothiazide (HYDRODIURIL) 12.5 MG tablet Take 1 tablet (12.5 mg total) by mouth daily.  . irbesartan (AVAPRO) 300 MG tablet Take 1 tablet (300 mg total) by mouth daily.  . rivaroxaban (XARELTO) 20 MG TABS tablet Take 1 tablet (20 mg total) by mouth daily with supper.  . TURMERIC PO Take 1,200 mg by mouth daily.     Allergies:   Patient has no known allergies.   Social History   Tobacco Use  . Smoking status: Never Smoker  . Smokeless tobacco: Never Used  Substance Use Topics  . Alcohol use: Yes  Alcohol/week: 5.0 standard drinks    Types: 5 Glasses of wine per week    Comment: does not drink very much; glass of wine every day  . Drug use: No     Family Hx: The patient's family history includes Colon cancer in his father, paternal uncle, and paternal uncle; Hypertension in an other family member.  ROS:   Please see the history of present illness.    As above All other systems reviewed and are negative.   Prior CV studies:   The following studies were reviewed today:  Echocardiogram report from September 2018 was reviewed  Labs/Other Tests and Data Reviewed:    EKG:  No ECG reviewed.  Recent Labs: 06/03/2018: ALT 20; BUN 16; Creatinine, Ser 0.93; Hemoglobin 16.5;  Platelets 183.0; Potassium 3.9; Sodium 140; TSH 2.05   Recent Lipid Panel Lab Results  Component Value Date/Time   CHOL 159 10/09/2017 08:07 AM   TRIG 105.0 10/09/2017 08:07 AM   HDL 52.30 10/09/2017 08:07 AM   CHOLHDL 3 10/09/2017 08:07 AM   LDLCALC 86 10/09/2017 08:07 AM    Wt Readings from Last 3 Encounters:  05/12/19 195 lb (88.5 kg)  04/22/19 194 lb (88 kg)  02/22/19 196 lb 3.2 oz (89 kg)     Objective:    Vital Signs:  BP 134/74 (BP Location: Left Arm, Patient Position: Sitting, Cuff Size: Normal)   Pulse 60   Temp (!) 96.9 F (36.1 C)   Ht 6' (1.829 m)   Wt 195 lb (88.5 kg)   BMI 26.45 kg/m    VITAL SIGNS:  reviewed  ASSESSMENT & PLAN:    1. Preop cardiovascular evaluation: I discussed the process with the gentleman in extensive length.  He is an elderly gentleman with multiple risk factors for coronary artery disease.  He is stable and asymptomatic but leads a very sedentary lifestyle.  In view of risk factors I would like for him to have a Lexiscan sestamibi.  It would be preferable that he also has an EKG when he goes for the stress test so we have a preoperative EKG also.  If it is not possible to obtain one that should be fine.  If the test is negative then he is not at high risk for coronary events during the aforementioned surgery.  Meticulous hemodynamic monitoring will further reduce the risk of coronary events. 2. Paroxysmal atrial fibrillation:I discussed with the patient atrial fibrillation, disease process. Management and therapy including rate and rhythm control, anticoagulation benefits and potential risks were discussed extensively with the patient. Patient had multiple questions which were answered to patient's satisfaction.  His doctors have mentioned to him about withholding anticoagulation before the surgery and he will follow the directions.  COVID-19 Education: The signs and symptoms of COVID-19 were discussed with the patient and how to seek care for  testing (follow up with PCP or arrange E-visit).  The importance of social distancing was discussed today.  Time:   Today, I have spent 15 minutes with the patient with telehealth technology discussing the above problems.     Medication Adjustments/Labs and Tests Ordered: Current medicines are reviewed at length with the patient today.  Concerns regarding medicines are outlined above.   Tests Ordered: No orders of the defined types were placed in this encounter.   Medication Changes: No orders of the defined types were placed in this encounter.   Disposition:  Follow up in 3 month(s)  Signed, Jenean Lindau, MD  05/12/2019 11:24  AM    [place]

## 2019-05-14 NOTE — Addendum Note (Signed)
Addended by: Beckey Rutter on: 05/14/2019 12:04 PM   Modules accepted: Orders

## 2019-05-19 ENCOUNTER — Other Ambulatory Visit: Payer: Self-pay

## 2019-05-19 ENCOUNTER — Ambulatory Visit (HOSPITAL_COMMUNITY)
Admission: RE | Admit: 2019-05-19 | Discharge: 2019-05-19 | Disposition: A | Discharge status: Home / Self Care | Payer: Medicare HMO | Source: Ambulatory Visit | Attending: Cardiology | Admitting: Cardiology

## 2019-05-19 DIAGNOSIS — Z01810 Encounter for preprocedural cardiovascular examination: Secondary | ICD-10-CM | POA: Diagnosis not present

## 2019-05-19 LAB — MYOCARDIAL PERFUSION IMAGING
LV dias vol: 121 mL (ref 62–150)
LV sys vol: 52 mL
Peak HR: 84 {beats}/min
Rest HR: 60 {beats}/min
SDS: 1
SRS: 0
SSS: 1
TID: 0.99

## 2019-05-19 MED ORDER — TECHNETIUM TC 99M TETROFOSMIN IV KIT
9.9000 | PACK | Freq: Once | INTRAVENOUS | Status: AC | PRN
  Administered 2019-05-19: 9.9 via INTRAVENOUS
  Filled 2019-05-19: qty 10

## 2019-05-19 MED ORDER — REGADENOSON 0.4 MG/5ML IV SOLN
0.4000 mg | Freq: Once | INTRAVENOUS | Status: AC
  Administered 2019-05-19: 0.4 mg via INTRAVENOUS

## 2019-05-19 MED ORDER — TECHNETIUM TC 99M TETROFOSMIN IV KIT
31.4000 | PACK | Freq: Once | INTRAVENOUS | Status: AC | PRN
  Administered 2019-05-19: 31.4 via INTRAVENOUS
  Filled 2019-05-19: qty 32

## 2019-05-20 ENCOUNTER — Telehealth: Payer: Self-pay | Admitting: Family Medicine

## 2019-05-20 NOTE — Telephone Encounter (Signed)
Debrox would be reasonable to trial.  I also recommend mineral oil-see below  Mineral oil for ear full of wax Purchase mineral oil from laxative aisle Lay down on your side with ear that is bothering you facing up Use 3-4 drops with a dropper and place in ear for 30 seconds Place cotton swab outside of ear Turn to other side and allow this to drain Repeat 3-4 x a day Return to see [place] if not improving within a few days

## 2019-05-20 NOTE — Telephone Encounter (Signed)
Patient's wife called to get a home remedy about doing an ear lavage because the wax is bothering the patient. I suggested making an appointment with Dr. Yong Channel for in office for removal or virtual visit however, she declined and requested the clinical team call to advise. Marking as high priority due to it being an "acute need."

## 2019-05-20 NOTE — Telephone Encounter (Signed)
Please advise called to advise for Debrox   Any other suggestions?  Please advise

## 2019-05-21 NOTE — Telephone Encounter (Signed)
Called pt wife and she stated that they "have everything handled and they are good". No further action needed.

## 2019-05-22 ENCOUNTER — Encounter: Payer: Self-pay | Admitting: Family Medicine

## 2019-05-24 ENCOUNTER — Other Ambulatory Visit: Payer: Self-pay | Admitting: Family Medicine

## 2019-05-25 ENCOUNTER — Telehealth: Payer: Self-pay

## 2019-05-25 ENCOUNTER — Encounter: Payer: Self-pay | Admitting: Family Medicine

## 2019-05-25 ENCOUNTER — Ambulatory Visit (INDEPENDENT_AMBULATORY_CARE_PROVIDER_SITE_OTHER): Payer: Medicare HMO | Admitting: Family Medicine

## 2019-05-25 ENCOUNTER — Other Ambulatory Visit: Payer: Self-pay

## 2019-05-25 VITALS — BP 147/80 | HR 66 | Temp 98.3°F | Ht 72.0 in | Wt 191.8 lb

## 2019-05-25 DIAGNOSIS — H6121 Impacted cerumen, right ear: Secondary | ICD-10-CM | POA: Diagnosis not present

## 2019-05-25 DIAGNOSIS — I10 Essential (primary) hypertension: Secondary | ICD-10-CM | POA: Diagnosis not present

## 2019-05-25 NOTE — Telephone Encounter (Signed)
-----   Message from Jenean Lindau, MD sent at 05/19/2019  1:55 PM EDT ----- The results of the study is unremarkable. Please inform patient.I think this is pre-op. I will discuss in detail at next appointment. Cc  primary care/referring physician Jenean Lindau, MD 05/19/2019 1:54 PM

## 2019-05-25 NOTE — Telephone Encounter (Signed)
Called and left patient detailed voice message on patients phone regarding test results.

## 2019-05-25 NOTE — Patient Instructions (Addendum)
Health Maintenance Due  Topic Date Due  . COLONOSCOPY Waiting to schedule after upcoming knee replacement surgery 02/10/2019   We got a fair amount of wax out today- glad this resolved your symptoms  Blood pressure slightly high at 150 top # today. Usually you are under 140/90. Please monitor for next week or so and send [place] a message with how its doing as well as confirmation of which blood pressure medicines you are taking and times

## 2019-05-25 NOTE — Progress Notes (Signed)
Phone [phone]   Subjective:  Wesley Fuller is a 78 y.o. year old very pleasant male patient who presents for/with See problem oriented charting Chief Complaint  Patient presents with  . Ear Fullness    sx 4days   ROS- No chest pain or shortness of breath. No headache or blurry vision. Ear fullness both ears   Past Medical History-  Patient Active Problem List   Diagnosis Date Noted  . Paroxysmal atrial fibrillation ([place]) 08/05/2018    Priority: High  . MCI (mild cognitive impairment) 08/22/2015    Priority: High  . BPH associated with nocturia 02/22/2019    Priority: Medium  . Depression, major, single episode, mild ([place]) 06/01/2018    Priority: Medium  . Hyperglycemia 10/15/2016    Priority: Medium  . Essential hypertension 02/09/2008    Priority: Medium  . History of melanoma 02/09/2008    Priority: Medium  . Osteoarthritis of left knee 03/02/2014    Priority: Low  . VARICOSE VEINS LOWER EXTREMITIES W/INFLAMMATION 02/20/2010    Priority: Low  . GERD 11/21/2008    Priority: Low  . Actinic keratosis 10/12/2008    Priority: Low  . ERECTILE DYSFUNCTION 02/09/2008    Priority: Low  . RENAL CALCULUS, HX OF 02/09/2008    Priority: Low  . Pre-operative cardiovascular examination 05/12/2019  . Pain in left knee 01/28/2019  . Low blood pressure reading 04/13/2018  . Depression 03/23/2018  . Asymmetric SNHL (sensorineural hearing loss) 04/11/2016    Medications- reviewed and updated Current Outpatient Medications  Medication Sig Dispense Refill  . amLODipine (NORVASC) 5 MG tablet TAKE ONE TABLET BY MOUTH ONE TIME DAILY  AS DIRECTED 90 tablet 0  . Astaxanthin 4 MG CAPS astaxanthin    . calcium acetate (PHOSLO) 667 MG capsule Take by mouth 3 (three) times daily with meals.    . donepezil (ARICEPT) 10 MG tablet Take 1 tablet (10 mg total) by mouth at bedtime. 90 tablet 3  . glucosamine-chondroitin 500-400 MG tablet Take 1 tablet by mouth daily.    .  hydrochlorothiazide (HYDRODIURIL) 12.5 MG tablet TAKE ONE TABLET BY MOUTH ONE TIME DAILY  90 tablet 2  . irbesartan (AVAPRO) 300 MG tablet Take 1 tablet (300 mg total) by mouth daily. 90 tablet 3  . rivaroxaban (XARELTO) 20 MG TABS tablet Take 1 tablet (20 mg total) by mouth daily with supper. 90 tablet 2  . TURMERIC PO Take 1,200 mg by mouth daily.       Objective:  BP (!) 147/80   Pulse 66   Temp 98.3 F (36.8 C) (Oral)   Ht 6' (1.829 m)   Wt 191 lb 12.8 oz (87 kg)   SpO2 97%   BMI 26.01 kg/m  Gen: NAD, resting comfortably TM obscured by cerumen completely- full view after irrigation of TM- narrow canal in left CV: RRR no murmurs rubs or gallops Lungs: CTAB no crackles, wheeze, rhonchi Abdomen: soft/nontender/nondistended Skin: warm, dry  Procedure note: Verbal consent obtained- discussed risk of tympanic membrane perforation Obstructive copious Cerumen noted in bilateral ear- right worse than left. This obscures evaluation of tympanic membrane.  Irrigation with water and peroxide performed. Full view of tympanic membrane after procedure.  Patient tolerated procedure well     Assessment and Plan   Ear fullness S: 3-4 days of bilateral ear fullness- right worse than left. No significant pain. No treatment tried.  Had hearing loss for a few minutes but autoinsufflation helped A/P: Ear fullness appears to be  related to impacted cerumen-irrigation was completed today with resolution of symptoms.  Return precautions given  #hypertension S: Typically controlled on amlodipine 5 mg, hctz 12.5mg , irbesartan 300mg . Has not been checking home #s BP Readings from Last 3 Encounters:  05/25/19 (!) 147/80  05/12/19 134/74  04/23/19 134/78  A/P: Mild elevation today- From AVS:  " Blood pressure slightly high at  147-150 top # today. Usually you are under 140/90. Please monitor for next week or so and send [place] a message with how its doing as well as confirmation of which blood pressure  medicines you are taking and times  " -Patient will show AVS to wife who will help monitor  Future Appointments  Date Time Provider [place]  06/02/2019  2:00 PM [facility] PAT 1 [facility] None  12/27/2019  3:00 PM Cameron Sprang, MD [facility] None   Lab/Order associations: Impacted cerumen of right ear  Essential hypertension  Return precautions advised.  Garret Reddish, MD

## 2019-06-01 NOTE — Patient Instructions (Addendum)
Wesley Fuller    Your procedure is scheduled on: 06-07-2019   Report to [facility] Main  Entrance               Report to admitting at [place] 19 TEST ON__6-11-20_____ @___210pm____ , THIS TEST MUST BE DONE BEFORE SURGERY, COME TO [place].    Call this number if you have problems the morning of surgery [phone]    Remember:  [place], NO CHEWING GUM CANDY OR MINTS.   NO SOLID FOOD AFTER MIDNIGHT THE NIGHT PRIOR TO SURGERY. NOTHING BY MOUTH EXCEPT CLEAR LIQUIDS UNTIL  430 am. . PLEASE FINISH ENSURE DRINK PER SURGEON ORDER WHICH NEEDS TO BE COMPLETED AT 430 am.    CLEAR LIQUID DIET   Foods Allowed                                                                     Foods Excluded  Coffee and tea, regular and decaf                             liquids that you cannot  Plain Jell-O in any flavor                                             see through such as: Fruit ices (not with fruit pulp)                                     milk, soups, orange juice  Iced Popsicles                                    All solid food Carbonated beverages, regular and diet                                    Cranberry, grape and apple juices Sports drinks like Gatorade Lightly seasoned clear broth or consume(fat free) Sugar, honey syrup  Sample Menu Breakfast                                Lunch                                     Supper Cranberry juice                    Beef broth  Chicken broth Jell-O                                     Grape juice                           Apple juice Coffee or tea                        Jell-O                                      Popsicle                                                Coffee or tea                        Coffee or  tea  _____________________________________________________________________    Take these medicines the morning of surgery with A SIP OF WATER: amlodipine (norvasc)              You may not have any metal on your body including hair pins and              piercings  Do not wear jewelry,    lotions, powders or perfumes, deodorant                         Men may shave face and neck.   Do not bring valuables to the hospital. [place].  Contacts, dentures or bridgework may not be worn into surgery.  Leave suitcase in the car. After surgery it may be brought to your room.     _____________________________________________________________________             [facility] - Preparing for Surgery Before surgery, you can play an important role.  Because skin is not sterile, your skin needs to be as free of germs as possible.  You can reduce the number of germs on your skin by washing with CHG (chlorahexidine gluconate) soap before surgery.  CHG is an antiseptic cleaner which kills germs and bonds with the skin to continue killing germs even after washing. Please DO NOT use if you have an allergy to CHG or antibacterial soaps.  If your skin becomes reddened/irritated stop using the CHG and inform your nurse when you arrive at Short Stay. Do not shave (including legs and underarms) for at least 48 hours prior to the first CHG shower.  You may shave your face/neck. Please follow these instructions carefully:  1.  Shower with CHG Soap the night before surgery and the  morning of Surgery.  2.  If you choose to wash your hair, wash your hair first as usual with your  normal  shampoo.  3.  After you shampoo, rinse your hair and body thoroughly to remove the  shampoo.  4.  Use CHG as you would any other liquid soap.  You can apply chg directly  to the skin and wash                       Gently with a scrungie or clean  washcloth.  5.  Apply the CHG Soap to your body ONLY FROM THE NECK DOWN.   Do not use on face/ open                           Wound or open sores. Avoid contact with eyes, ears mouth and genitals (private parts).                       Wash face,  Genitals (private parts) with your normal soap.             6.  Wash thoroughly, paying special attention to the area where your surgery  will be performed.  7.  Thoroughly rinse your body with warm water from the neck down.  8.  DO NOT shower/wash with your normal soap after using and rinsing off  the CHG Soap.                9.  Pat yourself dry with a clean towel.            10.  Wear clean pajamas.            11.  Place clean sheets on your bed the night of your first shower and do not  sleep with pets. Day of Surgery : Do not apply any lotions/deodorants the morning of surgery.  Please wear clean clothes to the hospital/surgery center.  FAILURE TO FOLLOW THESE INSTRUCTIONS MAY RESULT IN THE CANCELLATION OF YOUR SURGERY PATIENT SIGNATURE_________________________________  NURSE SIGNATURE__________________________________  ________________________________________________________________________   Adam Phenix  An incentive spirometer is a tool that can help keep your lungs clear and active. This tool measures how well you are filling your lungs with each breath. Taking long deep breaths may help reverse or decrease the chance of developing breathing (pulmonary) problems (especially infection) following:  A long period of time when you are unable to move or be active. BEFORE THE PROCEDURE   If the spirometer includes an indicator to show your best effort, your nurse or respiratory therapist will set it to a desired goal.  If possible, sit up straight or lean slightly forward. Try not to slouch.  Hold the incentive spirometer in an upright position. INSTRUCTIONS FOR USE  1. Sit on the edge of your bed if possible, or sit up as far  as you can in bed or on a chair. 2. Hold the incentive spirometer in an upright position. 3. Breathe out normally. 4. Place the mouthpiece in your mouth and seal your lips tightly around it. 5. Breathe in slowly and as deeply as possible, raising the piston or the ball toward the top of the column. 6. Hold your breath for 3-5 seconds or for as long as possible. Allow the piston or ball to fall to the bottom of the column. 7. Remove the mouthpiece from your mouth and breathe out normally. 8. Rest for a few seconds and repeat Steps 1 through 7 at least 10 times every 1-2 hours when you are awake. Take your time and take a few normal breaths between deep breaths. 9. The spirometer may include an indicator to  show your best effort. Use the indicator as a goal to work toward during each repetition. 10. After each set of 10 deep breaths, practice coughing to be sure your lungs are clear. If you have an incision (the cut made at the time of surgery), support your incision when coughing by placing a pillow or rolled up towels firmly against it. Once you are able to get out of bed, walk around indoors and cough well. You may stop using the incentive spirometer when instructed by your caregiver.  RISKS AND COMPLICATIONS  Take your time so you do not get dizzy or light-headed.  If you are in pain, you may need to take or ask for pain medication before doing incentive spirometry. It is harder to take a deep breath if you are having pain. AFTER USE  Rest and breathe slowly and easily.  It can be helpful to keep track of a log of your progress. Your caregiver can provide you with a simple table to help with this. If you are using the spirometer at home, follow these instructions: [place] IF:   You are having difficultly using the spirometer.  You have trouble using the spirometer as often as instructed.  Your pain medication is not giving enough relief while using the spirometer.  You  develop fever of 100.5 F (38.1 C) or higher. SEEK IMMEDIATE MEDICAL CARE IF:   You cough up bloody sputum that had not been present before.  You develop fever of 102 F (38.9 C) or greater.  You develop worsening pain at or near the incision site. MAKE SURE YOU:   Understand these instructions.  Will watch your condition.  Will get help right away if you are not doing well or get worse. Document Released: 04/21/2007 Document Revised: 03/02/2012 Document Reviewed: 06/22/2007 ExitCare Patient Information 2014 ExitCare, [place].   ________________________________________________________________________  WHAT IS A BLOOD TRANSFUSION? Blood Transfusion Information  A transfusion is the replacement of blood or some of its parts. Blood is made up of multiple cells which provide different functions.  Red blood cells carry oxygen and are used for blood loss replacement.  White blood cells fight against infection.  Platelets control bleeding.  Plasma helps clot blood.  Other blood products are available for specialized needs, such as hemophilia or other clotting disorders. BEFORE THE TRANSFUSION  Who gives blood for transfusions?   Healthy volunteers who are fully evaluated to make sure their blood is safe. This is blood bank blood. Transfusion therapy is the safest it has ever been in the practice of medicine. Before blood is taken from a donor, a complete history is taken to make sure that person has no history of diseases nor engages in risky social behavior (examples are intravenous drug use or sexual activity with multiple partners). The donor's travel history is screened to minimize risk of transmitting infections, such as malaria. The donated blood is tested for signs of infectious diseases, such as HIV and hepatitis. The blood is then tested to be sure it is compatible with you in order to minimize the chance of a transfusion reaction. If you or a relative donates blood, this is  often done in anticipation of surgery and is not appropriate for emergency situations. It takes many days to process the donated blood. RISKS AND COMPLICATIONS Although transfusion therapy is very safe and saves many lives, the main dangers of transfusion include:   Getting an infectious disease.  Developing a transfusion reaction. This is an allergic reaction  to something in the blood you were given. Every precaution is taken to prevent this. The decision to have a blood transfusion has been considered carefully by your caregiver before blood is given. Blood is not given unless the benefits outweigh the risks. AFTER THE TRANSFUSION  Right after receiving a blood transfusion, you will usually feel much better and more energetic. This is especially true if your red blood cells have gotten low (anemic). The transfusion raises the level of the red blood cells which carry oxygen, and this usually causes an energy increase.  The nurse administering the transfusion will monitor you carefully for complications. HOME CARE INSTRUCTIONS  No special instructions are needed after a transfusion. You may find your energy is better. Speak with your caregiver about any limitations on activity for underlying diseases you may have. SEEK MEDICAL CARE IF:   Your condition is not improving after your transfusion.  You develop redness or irritation at the intravenous (IV) site. SEEK IMMEDIATE MEDICAL CARE IF:  Any of the following symptoms occur over the next 12 hours:  Shaking chills.  You have a temperature by mouth above 102 F (38.9 C), not controlled by medicine.  Chest, back, or muscle pain.  People around you feel you are not acting correctly or are confused.  Shortness of breath or difficulty breathing.  Dizziness and fainting.  You get a rash or develop hives.  You have a decrease in urine output.  Your urine turns a dark color or changes to pink, red, or brown. Any of the following  symptoms occur over the next 10 days:  You have a temperature by mouth above 102 F (38.9 C), not controlled by medicine.  Shortness of breath.  Weakness after normal activity.  The white part of the eye turns yellow (jaundice).  You have a decrease in the amount of urine or are urinating less often.  Your urine turns a dark color or changes to pink, red, or brown. Document Released: 12/06/2000 Document Revised: 03/02/2012 Document Reviewed: 07/25/2008 [facility] Patient Information 2014 [place], [place].  _______________________________________________________________________

## 2019-06-01 NOTE — Progress Notes (Signed)
Cardiac clearance note 05-12-19 dr Gwenith Spitz epic Stress test 05-19-19 Echo 09-30-18 ekg 08-26-18 epic (dr Ulanda Edison wanted ekg done prior to stress test 05-19-19, ekg to be done at pre op 06-02-19 Called kelly and left message to fax directions for stopping xarelto prior to surgery

## 2019-06-02 ENCOUNTER — Other Ambulatory Visit: Payer: Self-pay

## 2019-06-02 ENCOUNTER — Encounter: Payer: Self-pay | Admitting: Family Medicine

## 2019-06-02 ENCOUNTER — Encounter (HOSPITAL_COMMUNITY)
Admission: RE | Admit: 2019-06-02 | Discharge: 2019-06-02 | Disposition: A | Discharge status: Home / Self Care | Payer: Medicare HMO | Source: Ambulatory Visit | Attending: Orthopedic Surgery | Admitting: Orthopedic Surgery

## 2019-06-02 ENCOUNTER — Encounter (HOSPITAL_COMMUNITY): Payer: Self-pay

## 2019-06-02 DIAGNOSIS — R001 Bradycardia, unspecified: Secondary | ICD-10-CM | POA: Insufficient documentation

## 2019-06-02 DIAGNOSIS — Z01818 Encounter for other preprocedural examination: Secondary | ICD-10-CM | POA: Insufficient documentation

## 2019-06-02 DIAGNOSIS — M1712 Unilateral primary osteoarthritis, left knee: Secondary | ICD-10-CM | POA: Diagnosis not present

## 2019-06-02 DIAGNOSIS — Z1159 Encounter for screening for other viral diseases: Secondary | ICD-10-CM | POA: Insufficient documentation

## 2019-06-02 HISTORY — DX: Personal history of urinary calculi: Z87.442

## 2019-06-02 HISTORY — DX: Cardiac arrhythmia, unspecified: I49.9

## 2019-06-02 LAB — COMPREHENSIVE METABOLIC PANEL
ALT: 26 U/L (ref 0–44)
AST: 25 U/L (ref 15–41)
Albumin: 4.3 g/dL (ref 3.5–5.0)
Alkaline Phosphatase: 56 U/L (ref 38–126)
Anion gap: 6 (ref 5–15)
BUN: 13 mg/dL (ref 8–23)
CO2: 26 mmol/L (ref 22–32)
Calcium: 9.2 mg/dL (ref 8.9–10.3)
Chloride: 106 mmol/L (ref 98–111)
Creatinine, Ser: 0.88 mg/dL (ref 0.61–1.24)
GFR calc Af Amer: >60 mL/min (ref >60)
GFR calc non Af Amer: >60 mL/min (ref >60)
Glucose, Bld: 95 mg/dL (ref 70–99)
Potassium: 3.7 mmol/L (ref 3.5–5.1)
Sodium: 138 mmol/L (ref 135–145)
Total Bilirubin: 2 mg/dL — ABNORMAL HIGH (ref 0.3–1.2)
Total Protein: 6.8 g/dL (ref 6.5–8.1)

## 2019-06-02 LAB — CBC
HCT: 46.3 % (ref 39.0–52.0)
Hemoglobin: 16.1 g/dL (ref 13.0–17.0)
MCH: 33.5 pg (ref 26.0–34.0)
MCHC: 34.8 g/dL (ref 30.0–36.0)
MCV: 96.5 fL (ref 80.0–100.0)
Platelets: 174 10*3/uL (ref 150–400)
RBC: 4.8 MIL/uL (ref 4.22–5.81)
RDW: 12.1 % (ref 11.5–15.5)
WBC: 8.2 10*3/uL (ref 4.0–10.5)
nRBC: 0 % (ref 0.0–0.2)

## 2019-06-02 LAB — ABO/RH: ABO/RH(D): O NEG

## 2019-06-02 LAB — PROTIME-INR
INR: 1.4 — ABNORMAL HIGH (ref 0.8–1.2)
Prothrombin Time: 17 seconds — ABNORMAL HIGH (ref 11.4–15.2)

## 2019-06-02 LAB — SURGICAL PCR SCREEN
MRSA, PCR: NEGATIVE
Staphylococcus aureus: NEGATIVE

## 2019-06-02 LAB — APTT: aPTT: 31 seconds (ref 24–36)

## 2019-06-03 ENCOUNTER — Other Ambulatory Visit (HOSPITAL_COMMUNITY)
Admission: RE | Admit: 2019-06-03 | Discharge: 2019-06-03 | Disposition: A | Discharge status: Home / Self Care | Payer: Medicare HMO | Source: Ambulatory Visit | Attending: Orthopedic Surgery | Admitting: Orthopedic Surgery

## 2019-06-03 DIAGNOSIS — Z01818 Encounter for other preprocedural examination: Secondary | ICD-10-CM | POA: Diagnosis not present

## 2019-06-03 NOTE — Progress Notes (Signed)
Anesthesia Chart Review   Case: [id number] Date/Time: 06/07/19 1015   Procedure: TOTAL KNEE ARTHROPLASTY (Left ) - 22min   Anesthesia type: Choice   Pre-op diagnosis: left knee osteoarthritis   Location: [facility] ROOM 10 / [facility]   Surgeon: Gaynelle Arabian, MD      DISCUSSION: 78 yo never smoker with h/o HTN, GERD, A-fib (on Xarelto), left knee OA scheduled for above procedure 06/07/2019 with Dr. Gaynelle Arabian.   Pt seen by cardiologist, Dr. Jyl Heinz, 05/12/2019 for preoperative evaluation.  Per OV note, "He is stable and asymptomatic but leads a very sedentary lifestyle.  In view of risk factors I would like for him to have a Lexiscan sestamibi.  It would be preferable that he also has an EKG when he goes for the stress test so we have a preoperative EKG also.  If it is not possible to obtain one that should be fine.  If the test is negative then he is not at high risk for coronary events during the aforementioned surgery.  Meticulous hemodynamic monitoring will further reduce the risk of coronary events.  His doctors have mentioned to him about withholding anticoagulation before the surgery and he will follow the directions."  Stress test 05/19/2019, low risk study.   Pt can proceed with planned procedure barring acute status change.  VS: BP 138/76   Pulse 63   Temp 36.8 C (Oral)   Resp 18   Ht 6' (1.829 m)   Wt 86.4 kg   SpO2 98%   BMI 25.82 kg/m   PROVIDERS: Marin Olp, MD is PCP   Jyl Heinz, MD is Cardiologist  LABS: Labs reviewed: Acceptable for surgery. (all labs ordered are listed, but only abnormal results are displayed)  Labs Reviewed  COMPREHENSIVE METABOLIC PANEL - Abnormal; Notable for the following components:      Result Value   Total Bilirubin 2.0 (*)    All other components within normal limits  PROTIME-INR - Abnormal; Notable for the following components:   Prothrombin Time 17.0 (*)    INR 1.4 (*)    All other components within normal limits   SURGICAL PCR SCREEN  APTT  CBC  TYPE AND SCREEN  ABO/RH     IMAGES:   EKG: 06/02/2019 Rate 59 bpm Sinus bradycardia Otherwise normal ECG  CV: Stress Test 05/19/2019  The left ventricular ejection fraction is normal (55-65%).  Nuclear stress EF: 57%.  There was no ST segment deviation noted during stress.  There is a small defect of mild severity present in the basal inferior location. The defect is non-reversible and consistent with diaphragmatic attenuation artifact.  There is a small defect of mild severity present in the apical inferior location. The defect is reversible but very subtle and likely represents variations in diaphragmatic attenuation artifact.  This is a low risk study.  Echo 09/01/18 Study Conclusions  - Left ventricle: The cavity size was normal. Wall thickness was   normal. Systolic function was normal. The estimated ejection   fraction was in the range of 60% to 65%. Wall motion was normal;   there were no regional wall motion abnormalities. Doppler   parameters are consistent with abnormal left ventricular   relaxation (grade 1 diastolic dysfunction). - Aortic valve: There was mild stenosis. - Ascending aorta: The ascending aorta was mildly dilated. - Mitral valve: Calcified annulus. Mildly thickened leaflets . - Left atrium: The atrium was mildly dilated.  Impressions:  - Normal LV systolic function; mild diastolic  dysfunction;   calcified aortic valve with mild AS (mean gradient 12 mmHg);   mildly dilated ascending aorta; mild LAE. Past Medical History:  Diagnosis Date  . Allergy    seasonal  . Asthma    as a child  . Calcium oxalate renal stones   . Cataract 2013  . Diverticulosis   . Dysrhythmia    a fib  . ED (erectile dysfunction)   . GERD (gastroesophageal reflux disease)   . History of kidney stones   . Hypertension   . Melanoma of back ([place]) 1974  . Nail fungal infection   . Osteoarthritis of left knee 03/02/2014     Past Surgical History:  Procedure Laterality Date  . EYE SURGERY     cataract, glaucoma surgery  . HERNIA REPAIR     x 2  . left leg stripping     varicose vein  . lymph node disection  1981  . melanoma removal of back   1974  . NASAL POLYP SURGERY    . skin cancer    . TONSILLECTOMY      MEDICATIONS: . amLODipine (NORVASC) 5 MG tablet  . Astaxanthin 4 MG CAPS  . CALCIUM PO  . donepezil (ARICEPT) 10 MG tablet  . glucosamine-chondroitin 500-400 MG tablet  . hydrochlorothiazide (HYDRODIURIL) 12.5 MG tablet  . irbesartan (AVAPRO) 300 MG tablet  . Melatonin 5 MG TABS  . rivaroxaban (XARELTO) 20 MG TABS tablet  . TURMERIC PO   No current facility-administered medications for this encounter.     Maia Plan [facility] Pre-Surgical Testing [phone] 06/03/19 10:35 AM

## 2019-06-03 NOTE — Anesthesia Preprocedure Evaluation (Addendum)
Anesthesia Evaluation  Patient identified by MRN, date of birth, ID band Patient awake    Reviewed: Allergy & Precautions, NPO status , Patient's Chart, lab work & pertinent test results  History of Anesthesia Complications Negative for: history of anesthetic complications  Airway Mallampati: II  TM Distance: >3 FB Neck ROM: Full    Dental  (+) Dental Advisory Given, Teeth Intact   Pulmonary asthma ,    breath sounds clear to auscultation       Cardiovascular hypertension, Pt. on medications + dysrhythmias  Rhythm:Regular Rate:Normal   '20 Myoperfusion - The LVEF is normal (55-65%). Nuclear stress EF: 57%. There was no ST segment deviation noted during stress. There is a small defect of mild severity present in the basal inferior location. The defect is non-reversible and consistent with diaphragmatic attenuation artifact. There is a small defect of mild severity present in the apical inferior location. The defect is reversible but very subtle and likely represents variations in diaphragmatic attenuation artifact. This is a low risk study.    Neuro/Psych PSYCHIATRIC DISORDERS Depression negative neurological ROS     GI/Hepatic Neg liver ROS, GERD  Controlled,  Endo/Other  negative endocrine ROS  Renal/GU  Hx kidney stones      Musculoskeletal  (+) Arthritis ,   Abdominal   Peds  Hematology negative hematology ROS (+)   Anesthesia Other Findings   Reproductive/Obstetrics                           Anesthesia Physical Anesthesia Plan  ASA: III  Anesthesia Plan: General   Post-op Pain Management:    Induction: Intravenous  PONV Risk Score and Plan: 2 and Treatment may vary due to age or medical condition and Ondansetron  Airway Management Planned: LMA  Additional Equipment: None  Intra-op Plan:   Post-operative Plan: Extubation in OR  Informed Consent: I have reviewed  the patients History and Physical, chart, labs and discussed the procedure including the risks, benefits and alternatives for the proposed anesthesia with the patient or authorized representative who has indicated his/her understanding and acceptance.     Dental advisory given  Plan Discussed with: CRNA, Anesthesiologist and Surgeon  Anesthesia Plan Comments: ( Patient took xarelto Friday evening, under 72 hours ago. Surgeon aware, ok to proceed. Will avoid spinal due to increased bleeding risk and proceed with general anesthesia.)      Anesthesia Quick Evaluation

## 2019-06-04 LAB — NOVEL CORONAVIRUS, NAA (HOSP ORDER, SEND-OUT TO REF LAB; TAT 18-24 HRS): SARS-CoV-2, NAA: NOT DETECTED

## 2019-06-04 NOTE — Progress Notes (Signed)
SPOKE W/   Wesley Fuller and reviewed the following prescreeing questions:   SCREENING SYMPTOMS OF COVID 19:  COUGH--no  RUNNY NOSE--- no  SORE THROAT---no  NASAL CONGESTION----no  SNEEZING----no  SHORTNESS OF BREATH---no  DIFFICULTY BREATHING---no  TEMP >100.0 -----no  UNEXPLAINED BODY ACHES------no  CHILLS -------- no  HEADACHES --------no-  LOSS OF SMELL/ TASTE --------no  HAVE YOU OR ANY FAMILY MEMBER TRAVELLED PAST 14 DAYS OUT OF THE   COUNTY---no STATE----no COUNTRY----no  HAVE YOU OR ANY FAMILY MEMBER BEEN EXPOSED TO ANYONE WITH COVID 19?  no

## 2019-06-04 NOTE — Progress Notes (Signed)
Left message for patient to call back if any symptoms of Covid 19    SPOKE W/  _     SCREENING SYMPTOMS OF COVID 19:   COUGH--  RUNNY NOSE---   SORE THROAT---  NASAL CONGESTION----  SNEEZING----  SHORTNESS OF BREATH---  DIFFICULTY BREATHING---  TEMP >100.0 -----  UNEXPLAINED BODY ACHES------  CHILLS --------   HEADACHES ---------  LOSS OF SMELL/ TASTE --------    HAVE YOU OR ANY FAMILY MEMBER TRAVELLED PAST 14 DAYS OUT OF THE   COUNTY--- STATE---- COUNTRY----  HAVE YOU OR ANY FAMILY MEMBER BEEN EXPOSED TO ANYONE WITH COVID 19?

## 2019-06-06 MED ORDER — BUPIVACAINE LIPOSOME 1.3 % IJ SUSP
20.0000 mL | INTRAMUSCULAR | Status: DC
  Filled 2019-06-06: qty 20

## 2019-06-07 ENCOUNTER — Encounter (HOSPITAL_COMMUNITY)
Admission: RE | Disposition: A | Discharge status: Home / Self Care | Payer: Self-pay | Source: Home / Self Care | Attending: Orthopedic Surgery

## 2019-06-07 ENCOUNTER — Ambulatory Visit (HOSPITAL_COMMUNITY): Payer: Medicare HMO | Admitting: Certified Registered Nurse Anesthetist

## 2019-06-07 ENCOUNTER — Other Ambulatory Visit: Payer: Self-pay

## 2019-06-07 ENCOUNTER — Ambulatory Visit (HOSPITAL_COMMUNITY)
Admission: RE | Admit: 2019-06-07 | Discharge: 2019-06-08 | Disposition: A | Discharge status: Home / Self Care | Payer: Medicare HMO | Attending: Orthopedic Surgery | Admitting: Orthopedic Surgery

## 2019-06-07 ENCOUNTER — Encounter (HOSPITAL_COMMUNITY): Payer: Self-pay | Admitting: *Deleted

## 2019-06-07 ENCOUNTER — Ambulatory Visit (HOSPITAL_COMMUNITY): Payer: Medicare HMO | Admitting: Physician Assistant

## 2019-06-07 DIAGNOSIS — M25762 Osteophyte, left knee: Secondary | ICD-10-CM | POA: Diagnosis not present

## 2019-06-07 DIAGNOSIS — I10 Essential (primary) hypertension: Secondary | ICD-10-CM | POA: Insufficient documentation

## 2019-06-07 DIAGNOSIS — Z7901 Long term (current) use of anticoagulants: Secondary | ICD-10-CM | POA: Diagnosis not present

## 2019-06-07 DIAGNOSIS — Z85820 Personal history of malignant melanoma of skin: Secondary | ICD-10-CM | POA: Insufficient documentation

## 2019-06-07 DIAGNOSIS — Z79899 Other long term (current) drug therapy: Secondary | ICD-10-CM | POA: Insufficient documentation

## 2019-06-07 DIAGNOSIS — M1712 Unilateral primary osteoarthritis, left knee: Secondary | ICD-10-CM | POA: Diagnosis not present

## 2019-06-07 DIAGNOSIS — M25562 Pain in left knee: Secondary | ICD-10-CM | POA: Diagnosis present

## 2019-06-07 DIAGNOSIS — I480 Paroxysmal atrial fibrillation: Secondary | ICD-10-CM | POA: Diagnosis not present

## 2019-06-07 DIAGNOSIS — M1710 Unilateral primary osteoarthritis, unspecified knee: Secondary | ICD-10-CM

## 2019-06-07 DIAGNOSIS — M179 Osteoarthritis of knee, unspecified: Secondary | ICD-10-CM

## 2019-06-07 DIAGNOSIS — G8918 Other acute postprocedural pain: Secondary | ICD-10-CM | POA: Diagnosis not present

## 2019-06-07 HISTORY — PX: TOTAL KNEE ARTHROPLASTY: SHX125

## 2019-06-07 LAB — TYPE AND SCREEN
ABO/RH(D): O NEG
Antibody Screen: NEGATIVE

## 2019-06-07 SURGERY — ARTHROPLASTY, KNEE, TOTAL
Anesthesia: Spinal | Site: Knee | Laterality: Left

## 2019-06-07 MED ORDER — SODIUM CHLORIDE (PF) 0.9 % IJ SOLN
INTRAMUSCULAR | Status: DC | PRN
  Administered 2019-06-07: 60 mL

## 2019-06-07 MED ORDER — METHOCARBAMOL 500 MG PO TABS
500.0000 mg | ORAL_TABLET | Freq: Four times a day (QID) | ORAL | Status: DC | PRN
  Administered 2019-06-07 – 2019-06-08 (×4): 500 mg via ORAL
  Filled 2019-06-07 (×4): qty 1

## 2019-06-07 MED ORDER — FENTANYL CITRATE (PF) 100 MCG/2ML IJ SOLN
25.0000 ug | INTRAMUSCULAR | Status: DC | PRN
  Administered 2019-06-07 (×3): 50 ug via INTRAVENOUS

## 2019-06-07 MED ORDER — CHLORHEXIDINE GLUCONATE 4 % EX LIQD: 60.0000 mL | Freq: Once | CUTANEOUS | Status: DC

## 2019-06-07 MED ORDER — FENTANYL CITRATE (PF) 100 MCG/2ML IJ SOLN
50.0000 ug | INTRAMUSCULAR | Status: DC
  Administered 2019-06-07: 50 ug via INTRAVENOUS
  Filled 2019-06-07: qty 2

## 2019-06-07 MED ORDER — ONDANSETRON HCL 4 MG/2ML IJ SOLN: 4.0000 mg | Freq: Once | INTRAMUSCULAR | Status: DC | PRN

## 2019-06-07 MED ORDER — POLYETHYLENE GLYCOL 3350 17 G PO PACK: 17.0000 g | PACK | Freq: Every day | ORAL | Status: DC | PRN

## 2019-06-07 MED ORDER — MORPHINE SULFATE (PF) 2 MG/ML IV SOLN: 0.5000 mg | INTRAVENOUS | Status: DC | PRN

## 2019-06-07 MED ORDER — IRBESARTAN 150 MG PO TABS
300.0000 mg | ORAL_TABLET | Freq: Every day | ORAL | Status: DC
  Administered 2019-06-08: 300 mg via ORAL
  Filled 2019-06-07: qty 2

## 2019-06-07 MED ORDER — SODIUM CHLORIDE 0.9 % IR SOLN
Status: DC | PRN
  Administered 2019-06-07: 1000 mL

## 2019-06-07 MED ORDER — METHOCARBAMOL 500 MG IVPB - SIMPLE MED
500.0000 mg | Freq: Four times a day (QID) | INTRAVENOUS | Status: DC | PRN
  Administered 2019-06-07: 500 mg via INTRAVENOUS
  Filled 2019-06-07: qty 50

## 2019-06-07 MED ORDER — METOCLOPRAMIDE HCL 5 MG/ML IJ SOLN: 5.0000 mg | Freq: Three times a day (TID) | INTRAMUSCULAR | Status: DC | PRN

## 2019-06-07 MED ORDER — TRANEXAMIC ACID-NACL 1000-0.7 MG/100ML-% IV SOLN
1000.0000 mg | INTRAVENOUS | Status: AC
  Administered 2019-06-07: 1000 mg via INTRAVENOUS
  Filled 2019-06-07: qty 100

## 2019-06-07 MED ORDER — BISACODYL 10 MG RE SUPP: 10.0000 mg | Freq: Every day | RECTAL | Status: DC | PRN

## 2019-06-07 MED ORDER — SODIUM CHLORIDE (PF) 0.9 % IJ SOLN
INTRAMUSCULAR | Status: AC
  Filled 2019-06-07: qty 10

## 2019-06-07 MED ORDER — DIPHENHYDRAMINE HCL 12.5 MG/5ML PO ELIX: 12.5000 mg | ORAL_SOLUTION | ORAL | Status: DC | PRN

## 2019-06-07 MED ORDER — DEXAMETHASONE SODIUM PHOSPHATE 10 MG/ML IJ SOLN
INTRAMUSCULAR | Status: AC
  Filled 2019-06-07: qty 1

## 2019-06-07 MED ORDER — FENTANYL CITRATE (PF) 100 MCG/2ML IJ SOLN
INTRAMUSCULAR | Status: DC | PRN
  Administered 2019-06-07 (×4): 50 ug via INTRAVENOUS

## 2019-06-07 MED ORDER — SODIUM CHLORIDE 0.9 % IV SOLN
INTRAVENOUS | Status: DC
  Administered 2019-06-07: 14:00:00 via INTRAVENOUS

## 2019-06-07 MED ORDER — AMLODIPINE BESYLATE 5 MG PO TABS
5.0000 mg | ORAL_TABLET | Freq: Every day | ORAL | Status: DC
  Administered 2019-06-08: 5 mg via ORAL
  Filled 2019-06-07: qty 1

## 2019-06-07 MED ORDER — TRAMADOL HCL 50 MG PO TABS: 50.0000 mg | ORAL_TABLET | Freq: Four times a day (QID) | ORAL | Status: DC | PRN

## 2019-06-07 MED ORDER — CEFAZOLIN SODIUM-DEXTROSE 2-4 GM/100ML-% IV SOLN
2.0000 g | Freq: Four times a day (QID) | INTRAVENOUS | Status: AC
  Administered 2019-06-07 (×2): 2 g via INTRAVENOUS
  Filled 2019-06-07 (×2): qty 100

## 2019-06-07 MED ORDER — BUPIVACAINE LIPOSOME 1.3 % IJ SUSP
INTRAMUSCULAR | Status: DC | PRN
  Administered 2019-06-07: 20 mL

## 2019-06-07 MED ORDER — FENTANYL CITRATE (PF) 100 MCG/2ML IJ SOLN
INTRAMUSCULAR | Status: AC
  Filled 2019-06-07: qty 2

## 2019-06-07 MED ORDER — CEFAZOLIN SODIUM-DEXTROSE 2-4 GM/100ML-% IV SOLN
2.0000 g | INTRAVENOUS | Status: AC
  Administered 2019-06-07: 2 g via INTRAVENOUS
  Filled 2019-06-07: qty 100

## 2019-06-07 MED ORDER — LACTATED RINGERS IV SOLN
INTRAVENOUS | Status: DC
  Administered 2019-06-07 (×2): via INTRAVENOUS

## 2019-06-07 MED ORDER — PHENYLEPHRINE 40 MCG/ML (10ML) SYRINGE FOR IV PUSH (FOR BLOOD PRESSURE SUPPORT)
PREFILLED_SYRINGE | INTRAVENOUS | Status: AC
  Filled 2019-06-07: qty 10

## 2019-06-07 MED ORDER — SODIUM CHLORIDE (PF) 0.9 % IJ SOLN
INTRAMUSCULAR | Status: AC
  Filled 2019-06-07: qty 50

## 2019-06-07 MED ORDER — MENTHOL 3 MG MT LOZG: 1.0000 | LOZENGE | OROMUCOSAL | Status: DC | PRN

## 2019-06-07 MED ORDER — ACETAMINOPHEN 500 MG PO TABS
1000.0000 mg | ORAL_TABLET | Freq: Four times a day (QID) | ORAL | Status: AC
  Administered 2019-06-07 – 2019-06-08 (×4): 1000 mg via ORAL
  Filled 2019-06-07 (×4): qty 2

## 2019-06-07 MED ORDER — METOCLOPRAMIDE HCL 5 MG PO TABS: 5.0000 mg | ORAL_TABLET | Freq: Three times a day (TID) | ORAL | Status: DC | PRN

## 2019-06-07 MED ORDER — LIDOCAINE 2% (20 MG/ML) 5 ML SYRINGE
INTRAMUSCULAR | Status: AC
  Filled 2019-06-07: qty 5

## 2019-06-07 MED ORDER — ROPIVACAINE HCL 7.5 MG/ML IJ SOLN
INTRAMUSCULAR | Status: DC | PRN
  Administered 2019-06-07: 20 mL via PERINEURAL

## 2019-06-07 MED ORDER — ONDANSETRON HCL 4 MG PO TABS: 4.0000 mg | ORAL_TABLET | Freq: Four times a day (QID) | ORAL | Status: DC | PRN

## 2019-06-07 MED ORDER — DEXAMETHASONE SODIUM PHOSPHATE 10 MG/ML IJ SOLN
10.0000 mg | Freq: Once | INTRAMUSCULAR | Status: AC
  Administered 2019-06-08: 10 mg via INTRAVENOUS
  Filled 2019-06-07: qty 1

## 2019-06-07 MED ORDER — POVIDONE-IODINE 10 % EX SWAB
2.0000 "application " | Freq: Once | CUTANEOUS | Status: AC
  Administered 2019-06-07: 2 via TOPICAL

## 2019-06-07 MED ORDER — TRANEXAMIC ACID-NACL 1000-0.7 MG/100ML-% IV SOLN
1000.0000 mg | Freq: Once | INTRAVENOUS | Status: AC
  Administered 2019-06-07: 1000 mg via INTRAVENOUS
  Filled 2019-06-07: qty 100

## 2019-06-07 MED ORDER — PHENOL 1.4 % MT LIQD
1.0000 | OROMUCOSAL | Status: DC | PRN
  Filled 2019-06-07: qty 177

## 2019-06-07 MED ORDER — PROPOFOL 10 MG/ML IV BOLUS
INTRAVENOUS | Status: DC | PRN
  Administered 2019-06-07: 200 mg via INTRAVENOUS

## 2019-06-07 MED ORDER — ONDANSETRON HCL 4 MG/2ML IJ SOLN
INTRAMUSCULAR | Status: DC | PRN
  Administered 2019-06-07: 4 mg via INTRAVENOUS

## 2019-06-07 MED ORDER — ONDANSETRON HCL 4 MG/2ML IJ SOLN: 4.0000 mg | Freq: Four times a day (QID) | INTRAMUSCULAR | Status: DC | PRN

## 2019-06-07 MED ORDER — PROPOFOL 10 MG/ML IV BOLUS
INTRAVENOUS | Status: AC
  Filled 2019-06-07: qty 60

## 2019-06-07 MED ORDER — PHENYLEPHRINE 40 MCG/ML (10ML) SYRINGE FOR IV PUSH (FOR BLOOD PRESSURE SUPPORT)
PREFILLED_SYRINGE | INTRAVENOUS | Status: DC | PRN
  Administered 2019-06-07 (×3): 80 ug via INTRAVENOUS

## 2019-06-07 MED ORDER — ACETAMINOPHEN 10 MG/ML IV SOLN
1000.0000 mg | Freq: Four times a day (QID) | INTRAVENOUS | Status: DC
  Administered 2019-06-07: 1000 mg via INTRAVENOUS
  Filled 2019-06-07: qty 100

## 2019-06-07 MED ORDER — METHOCARBAMOL 500 MG IVPB - SIMPLE MED
INTRAVENOUS | Status: AC
  Filled 2019-06-07: qty 50

## 2019-06-07 MED ORDER — GABAPENTIN 100 MG PO CAPS
200.0000 mg | ORAL_CAPSULE | Freq: Three times a day (TID) | ORAL | Status: DC
  Administered 2019-06-08: 200 mg via ORAL
  Filled 2019-06-07: qty 2

## 2019-06-07 MED ORDER — HYDROCHLOROTHIAZIDE 12.5 MG PO CAPS
12.5000 mg | ORAL_CAPSULE | Freq: Every day | ORAL | Status: DC
  Administered 2019-06-08: 12.5 mg via ORAL
  Filled 2019-06-07: qty 1

## 2019-06-07 MED ORDER — DEXAMETHASONE SODIUM PHOSPHATE 10 MG/ML IJ SOLN
8.0000 mg | Freq: Once | INTRAMUSCULAR | Status: AC
  Administered 2019-06-07: 8 mg via INTRAVENOUS

## 2019-06-07 MED ORDER — OXYCODONE HCL 5 MG/5ML PO SOLN: 5.0000 mg | Freq: Once | ORAL | Status: DC | PRN

## 2019-06-07 MED ORDER — LIDOCAINE 2% (20 MG/ML) 5 ML SYRINGE
INTRAMUSCULAR | Status: DC | PRN
  Administered 2019-06-07: 80 mg via INTRAVENOUS

## 2019-06-07 MED ORDER — DOCUSATE SODIUM 100 MG PO CAPS
100.0000 mg | ORAL_CAPSULE | Freq: Two times a day (BID) | ORAL | Status: DC
  Administered 2019-06-07 – 2019-06-08 (×2): 100 mg via ORAL
  Filled 2019-06-07 (×2): qty 1

## 2019-06-07 MED ORDER — OXYCODONE HCL 5 MG PO TABS
5.0000 mg | ORAL_TABLET | ORAL | Status: DC | PRN
  Administered 2019-06-07 (×2): 5 mg via ORAL
  Filled 2019-06-07 (×2): qty 1

## 2019-06-07 MED ORDER — OXYCODONE HCL 5 MG PO TABS: 5.0000 mg | ORAL_TABLET | Freq: Once | ORAL | Status: DC | PRN

## 2019-06-07 MED ORDER — ONDANSETRON HCL 4 MG/2ML IJ SOLN
INTRAMUSCULAR | Status: AC
  Filled 2019-06-07: qty 2

## 2019-06-07 MED ORDER — FLEET ENEMA 7-19 GM/118ML RE ENEM: 1.0000 | ENEMA | Freq: Once | RECTAL | Status: DC | PRN

## 2019-06-07 MED ORDER — DONEPEZIL HCL 10 MG PO TABS
10.0000 mg | ORAL_TABLET | Freq: Every day | ORAL | Status: DC
  Administered 2019-06-07: 10 mg via ORAL
  Filled 2019-06-07: qty 1

## 2019-06-07 MED ORDER — RIVAROXABAN 10 MG PO TABS: 10.0000 mg | ORAL_TABLET | Freq: Every day | ORAL | Status: DC

## 2019-06-07 SURGICAL SUPPLY — 50 items
BANDAGE ACE 6X5 VEL STRL LF (GAUZE/BANDAGES/DRESSINGS) ×2 IMPLANT
BLADE SAG 18X100X1.27 (BLADE) ×2 IMPLANT
BLADE SAW SGTL 11.0X1.19X90.0M (BLADE) ×2 IMPLANT
BOWL SMART MIX CTS (DISPOSABLE) ×2 IMPLANT
CEMENT HV SMART SET (Cement) ×4 IMPLANT
CEMENT TIBIA MBT SIZE 4 (Knees) ×1 IMPLANT
COVER SURGICAL LIGHT HANDLE (MISCELLANEOUS) ×2 IMPLANT
COVER WAND RF STERILE (DRAPES) IMPLANT
CUFF TOURN SGL QUICK 34 (TOURNIQUET CUFF) ×2
CUFF TRNQT CYL 34X4.125X (TOURNIQUET CUFF) ×1 IMPLANT
DECANTER SPIKE VIAL GLASS SM (MISCELLANEOUS) ×4 IMPLANT
DRAPE U-SHAPE 47X51 STRL (DRAPES) ×2 IMPLANT
DRSG ADAPTIC 3X8 NADH LF (GAUZE/BANDAGES/DRESSINGS) ×2 IMPLANT
DRSG PAD ABDOMINAL 8X10 ST (GAUZE/BANDAGES/DRESSINGS) ×2 IMPLANT
DURAPREP 26ML APPLICATOR (WOUND CARE) ×2 IMPLANT
ELECT REM PT RETURN 15FT ADLT (MISCELLANEOUS) ×2 IMPLANT
EVACUATOR 1/8 PVC DRAIN (DRAIN) ×2 IMPLANT
FEMUR SIGMA PS SZ 5.0 L (Femur) ×2 IMPLANT
GAUZE SPONGE 4X4 12PLY STRL (GAUZE/BANDAGES/DRESSINGS) ×2 IMPLANT
GLOVE BIO SURGEON STRL SZ7 (GLOVE) ×2 IMPLANT
GLOVE BIO SURGEON STRL SZ8 (GLOVE) ×2 IMPLANT
GLOVE BIOGEL PI IND STRL 7.0 (GLOVE) ×1 IMPLANT
GLOVE BIOGEL PI IND STRL 8 (GLOVE) ×1 IMPLANT
GLOVE BIOGEL PI INDICATOR 7.0 (GLOVE) ×1
GLOVE BIOGEL PI INDICATOR 8 (GLOVE) ×1
GOWN STRL REUS W/TWL LRG LVL3 (GOWN DISPOSABLE) ×6 IMPLANT
HANDPIECE INTERPULSE COAX TIP (DISPOSABLE) ×1
HOLDER FOLEY CATH W/STRAP (MISCELLANEOUS) IMPLANT
IMMOBILIZER KNEE 20 (SOFTGOODS) ×2
IMMOBILIZER KNEE 20 THIGH 36 (SOFTGOODS) ×1 IMPLANT
KIT TURNOVER KIT A (KITS) IMPLANT
MANIFOLD NEPTUNE II (INSTRUMENTS) ×2 IMPLANT
PACK TOTAL KNEE CUSTOM (KITS) ×2 IMPLANT
PADDING CAST COTTON 6X4 STRL (CAST SUPPLIES) ×6 IMPLANT
PATELLA DOME PFC 41MM (Knees) ×2 IMPLANT
PIN STEINMAN FIXATION KNEE (PIN) ×2 IMPLANT
PLATE ROT INSERT 10MM SIZE 5 (Plate) ×2 IMPLANT
PROTECTOR NERVE ULNAR (MISCELLANEOUS) ×2 IMPLANT
SET HNDPC FAN SPRY TIP SCT (DISPOSABLE) ×1 IMPLANT
STRIP CLOSURE SKIN 1/2X4 (GAUZE/BANDAGES/DRESSINGS) ×4 IMPLANT
SUT MNCRL AB 4-0 PS2 18 (SUTURE) ×2 IMPLANT
SUT STRATAFIX 0 PDS 27 VIOLET (SUTURE) ×2
SUT VIC AB 2-0 CT1 27 (SUTURE) ×3
SUT VIC AB 2-0 CT1 TAPERPNT 27 (SUTURE) ×3 IMPLANT
SUTURE STRATFX 0 PDS 27 VIOLET (SUTURE) ×1 IMPLANT
TIBIA MBT CEMENT SIZE 4 (Knees) ×2 IMPLANT
TRAY FOLEY CATH 14FRSI W/METER (CATHETERS) ×2 IMPLANT
WATER STERILE IRR 1000ML POUR (IV SOLUTION) ×4 IMPLANT
WRAP KNEE MAXI GEL POST OP (GAUZE/BANDAGES/DRESSINGS) ×2 IMPLANT
YANKAUER SUCT BULB TIP 10FT TU (MISCELLANEOUS) ×2 IMPLANT

## 2019-06-07 NOTE — Care Plan (Signed)
Ortho Bundle Case Management Note  Patient Details  Name: Wesley Fuller MRN: [id number] Date of Birth: [birth year]  L TKA scheduled on 06-07-2019 DCP:  Home with spouse.  2 story home with 4 ste.  DME:  No needs.  Borrowing a RW and 3-in-1. PT:  [facility].  PT eval scheduled on 06-10-19                   DME Arranged:  N/A DME Agency:  NA  HH Arranged:  NA HH Agency:  NA  Additional Comments: Please contact me with any questions of if this plan should need to change.  Marianne Sofia, RN,CCM [facility]  [phone] 06/07/2019, 9:39 AM

## 2019-06-07 NOTE — Evaluation (Signed)
Physical Therapy Evaluation Patient Details Name: Wesley Fuller MRN: [id number] DOB: May 05, 1941 Today's Date: 06/07/2019   History of Present Illness  78 yo male s/p L TKR on 06/07/19. PMH includes PAF, depression, MCI, OA, GERD, HTN.  Clinical Impression  Pt presents with L knee ROM, difficulty performing mobility tasks, increased time and effort to perform mobility tasks, L knee pain, and decreased activity tolerance. Pt to benefit from acute PT to address deficits. Pt ambulated hallway distance with RW with min guard assist. Pt educated on ankle pumps (20/hour) to perform this afternoon/evening to increase circulation, to pt's tolerance and limited by pain. Pt hopeful to d/c home tomorrow. PT to progress mobility as tolerated, and will continue to follow acutely.        Follow Up Recommendations Follow surgeon's recommendation for DC plan and follow-up therapies;Supervision for mobility/OOB(OPPT)    Equipment Recommendations  None recommended by PT    Recommendations for Other Services       Precautions / Restrictions Precautions Precautions: Fall Required Braces or Orthoses: Knee Immobilizer - Left Knee Immobilizer - Left: On when out of bed or walking;Discontinue once straight leg raise with < 10 degree lag Restrictions Weight Bearing Restrictions: No Other Position/Activity Restrictions: WBAT      Mobility  Bed Mobility Overal bed mobility: Needs Assistance Bed Mobility: Supine to Sit     Supine to sit: Min assist     General bed mobility comments: Min assist for LLE management, trunk elevation. Increased time and effort.  Transfers Overall transfer level: Needs assistance Equipment used: Rolling walker (2 wheeled) Transfers: Sit to/from Stand Sit to Stand: Min assist;From elevated surface         General transfer comment: Min assist for power up, steadying. VC for hand placement when rising.  Ambulation/Gait Ambulation/Gait assistance: Min guard;+2  safety/equipment(chair follow) Gait Distance (Feet): 60 Feet Assistive device: Rolling walker (2 wheeled) Gait Pattern/deviations: Step-to pattern;Decreased step length - left;Decreased step length - right;Antalgic Gait velocity: decr   General Gait Details: Min guard for safety, verbal cuing for placement in and turning with RW, sequencing.  Stairs            Wheelchair Mobility    Modified Rankin (Stroke Patients Only)       Balance Overall balance assessment: Mild deficits observed, not formally tested                                           Pertinent Vitals/Pain Pain Assessment: 0-10 Pain Score: 3  Pain Location: L knee Pain Descriptors / Indicators: Sore;Tightness Pain Intervention(s): Limited activity within patient's tolerance;Monitored during session;Premedicated before session;Repositioned;Ice applied    Home Living Family/patient expects to be discharged to:: Private residence Living Arrangements: Spouse/significant other Available Help at Discharge: Family;Available 24 hours/day Type of Home: House Home Access: Stairs to enter Entrance Stairs-Rails: Psychiatric nurse of Steps: 4 Home Layout: Two level;Able to live on main level with bedroom/bathroom Home Equipment: Gilford Rile - 2 wheels;Bedside commode;Cane - single point      Prior Function Level of Independence: Independent               Hand Dominance   Dominant Hand: Right    Extremity/Trunk Assessment   Upper Extremity Assessment Upper Extremity Assessment: Overall WFL for tasks assessed    Lower Extremity Assessment Lower Extremity Assessment: Overall WFL for tasks assessed;LLE deficits/detail LLE Deficits /  Details: suspected post-surgical weakness; able to perform ankle pumps, quad set, heel slide to 45*, and SLR with mild quad lag noted LLE Sensation: WNL    Cervical / Trunk Assessment Cervical / Trunk Assessment: Normal  Communication    Communication: No difficulties  Cognition Arousal/Alertness: Awake/alert Behavior During Therapy: WFL for tasks assessed/performed Overall Cognitive Status: Within Functional Limits for tasks assessed                                 General Comments: history of cognitive impairment, pt with increased time to respond to questions and occasionally needs repeated VC, but otherwise pt WFL with PT.      General Comments      Exercises     Assessment/Plan    PT Assessment Patient needs continued PT services  PT Problem List Decreased strength;Decreased mobility;Decreased safety awareness;Decreased range of motion;Decreased activity tolerance;Decreased balance;Decreased knowledge of use of DME;Pain       PT Treatment Interventions DME instruction;Therapeutic activities;Gait training;Therapeutic exercise;Patient/family education;Stair training;Balance training;Functional mobility training    PT Goals (Current goals can be found in the Care Plan section)  Acute Rehab PT Goals Patient Stated Goal: go home, decrease L knee pain PT Goal Formulation: With patient Time For Goal Achievement: 06/14/19 Potential to Achieve Goals: Good    Frequency 7X/week   Barriers to discharge        Co-evaluation               AM-PAC PT "6 Clicks" Mobility  Outcome Measure Help needed turning from your back to your side while in a flat bed without using bedrails?: A Little Help needed moving from lying on your back to sitting on the side of a flat bed without using bedrails?: A Little Help needed moving to and from a bed to a chair (including a wheelchair)?: A Little Help needed standing up from a chair using your arms (e.g., wheelchair or bedside chair)?: A Little Help needed to walk in hospital room?: A Little Help needed climbing 3-5 steps with a railing? : A Lot 6 Click Score: 17    End of Session Equipment Utilized During Treatment: Gait belt;Left knee  immobilizer Activity Tolerance: Patient tolerated treatment well;No increased pain Patient left: in chair;with call bell/phone within reach;with chair alarm set;with family/visitor present;with SCD's reapplied Nurse Communication: Mobility status PT Visit Diagnosis: Other abnormalities of gait and mobility (R26.89);Difficulty in walking, not elsewhere classified (R26.2)    Time: 1749-4496 PT Time Calculation (min) (ACUTE ONLY): 29 min   Charges:   PT Evaluation $PT Eval Low Complexity: 1 Low PT Treatments $Gait Training: 8-22 mins       Julien Girt, PT Acute Rehabilitation Services Pager [phone]  Office [phone]   Holmes Hays D Elonda Husky 06/07/2019, 4:05 PM

## 2019-06-07 NOTE — Anesthesia Procedure Notes (Signed)
Procedure Name: LMA Insertion Date/Time: 06/07/2019 10:18 AM Performed by: Montel Clock, CRNA Pre-anesthesia Checklist: Patient identified, Emergency Drugs available, Suction available, Patient being monitored and Timeout performed Patient Re-evaluated:Patient Re-evaluated prior to induction Oxygen Delivery Method: Circle system utilized Preoxygenation: Pre-oxygenation with 100% oxygen Induction Type: IV induction LMA: LMA with gastric port inserted LMA Size: 4.0 Number of attempts: 1 Dental Injury: Teeth and Oropharynx as per pre-operative assessment

## 2019-06-07 NOTE — Anesthesia Procedure Notes (Signed)
Anesthesia Regional Block: Adductor canal block   Pre-Anesthetic Checklist: ,, timeout performed, Correct Patient, Correct Site, Correct Laterality, Correct Procedure, Correct Position, site marked, Risks and benefits discussed,  Surgical consent,  Pre-op evaluation,  At surgeon's request and post-op pain management  Laterality: Left  Prep: chloraprep       Needles:  Injection technique: Single-shot  Needle Type: Echogenic Needle     Needle Length: 10cm  Needle Gauge: 21     Additional Needles:   Narrative:  Start time: 06/07/2019 9:26 AM End time: 06/07/2019 9:30 AM Injection made incrementally with aspirations every 5 mL.  Performed by: Personally  Anesthesiologist: Audry Pili, MD  Additional Notes: No pain on injection. No increased resistance to injection. Injection made in 5cc increments. Good needle visualization. Patient tolerated the procedure well.

## 2019-06-07 NOTE — Anesthesia Postprocedure Evaluation (Signed)
Anesthesia Post Note  Patient: Wesley Fuller  Procedure(s) Performed: TOTAL KNEE ARTHROPLASTY (Left Knee)     Patient location during evaluation: PACU Anesthesia Type: General Level of consciousness: awake and alert Pain management: pain level controlled Vital Signs Assessment: post-procedure vital signs reviewed and stable Respiratory status: spontaneous breathing, nonlabored ventilation, respiratory function stable and patient connected to nasal cannula oxygen Cardiovascular status: blood pressure returned to baseline and stable Postop Assessment: no apparent nausea or vomiting Anesthetic complications: no    Last Vitals:  Vitals:   06/07/19 1315 06/07/19 1335  BP: (!) 150/75 (!) 162/78  Pulse: 68 69  Resp: 12 15  Temp: 36.9 C 37.1 C  SpO2: 100% 100%    Last Pain:  Vitals:   06/07/19 1335  TempSrc: Oral  PainSc:                  Audry Pili

## 2019-06-07 NOTE — Progress Notes (Signed)
AssistedDr. Brock with left, ultrasound guided, adductor canal block. Side rails up, monitors on throughout procedure. See vital signs in flow sheet. Tolerated Procedure well.  

## 2019-06-07 NOTE — Interval H&P Note (Signed)
History and Physical Interval Note:  06/07/2019 8:19 AM  Wesley Fuller  has presented today for surgery, with the diagnosis of left knee osteoarthritis.  The various methods of treatment have been discussed with the patient and family. After consideration of risks, benefits and other options for treatment, the patient has consented to  Procedure(s) with comments: TOTAL KNEE ARTHROPLASTY (Left) - 78min as a surgical intervention.  The patient's history has been reviewed, patient examined, no change in status, stable for surgery.  I have reviewed the patient's chart and labs.  Questions were answered to the patient's satisfaction.     Pilar Plate Frederick Marro

## 2019-06-07 NOTE — Op Note (Signed)
OPERATIVE REPORT-TOTAL KNEE ARTHROPLASTY   Pre-operative diagnosis- Osteoarthritis  Left knee(s)  Post-operative diagnosis- Osteoarthritis Left knee(s)  Procedure-  Left  Total Knee Arthroplasty  Surgeon- Wesley Plover. Li Bobo, MD  Assistant- Theresa Duty, PA-C   Anesthesia-  GA combined with regional for post-op pain  EBL- 25 ml   Drains Hemovac  Tourniquet time-  Total Tourniquet Time Documented: Thigh (Left) - 50 minutes Total: Thigh (Left) - 50 minutes     Complications- None  Condition-PACU - hemodynamically stable.   Brief Clinical Note   Wesley Fuller is a 78 y.o. year old male with end stage OA of his left knee with progressively worsening pain and dysfunction. He has constant pain, with activity and at rest and significant functional deficits with difficulties even with ADLs. He has had extensive non-op management including analgesics, injections of cortisone and viscosupplements, and home exercise program, but remains in significant pain with significant dysfunction. Radiographs show bone on bone arthritis medial and patellofemoral. He presents now for left Total Knee Arthroplasty.     Procedure in detail---   The patient is brought into the operating room and positioned supine on the operating table. After successful administration of  GA combined with regional for post-op pain,   a tourniquet is placed high on the  Left thigh(s) and the lower extremity is prepped and draped in the usual sterile fashion. Time out is performed by the operating team and then the  Left lower extremity is wrapped in Esmarch, knee flexed and the tourniquet inflated to 300 mmHg.       A midline incision is made with a ten blade through the subcutaneous tissue to the level of the extensor mechanism. A fresh blade is used to make a medial parapatellar arthrotomy. Soft tissue over the proximal medial tibia is subperiosteally elevated to the joint line with a knife and into the semimembranosus  bursa with a Cobb elevator. Soft tissue over the proximal lateral tibia is elevated with attention being paid to avoiding the patellar tendon on the tibial tubercle. The patella is everted, knee flexed 90 degrees and the ACL and PCL are removed. Findings are bone on bone medial and patellofemoral with massive global osteophytes.        The drill is used to create a starting hole in the distal femur and the canal is thoroughly irrigated with sterile saline to remove the fatty contents. The 5 degree Left  valgus alignment guide is placed into the femoral canal and the distal femoral cutting block is pinned to remove 10 mm off the distal femur. Resection is made with an oscillating saw.      The tibia is subluxed forward and the menisci are removed. The extramedullary alignment guide is placed referencing proximally at the medial aspect of the tibial tubercle and distally along the second metatarsal axis and tibial crest. The block is pinned to remove 43mm off the more deficient medial  side. Resection is made with an oscillating saw. Size 4is the most appropriate size for the tibia and the proximal tibia is prepared with the modular drill and keel punch for that size.      The femoral sizing guide is placed and size 5 is most appropriate. Rotation is marked off the epicondylar axis and confirmed by creating a rectangular flexion gap at 90 degrees. The size 5 cutting block is pinned in this rotation and the anterior, posterior and chamfer cuts are made with the oscillating saw. The intercondylar block is then  placed and that cut is made.      Trial size 4 tibial component, trial size 5 posterior stabilized femur and a 10  mm posterior stabilized rotating platform insert trial is placed. Full extension is achieved with excellent varus/valgus and anterior/posterior balance throughout full range of motion. The patella is everted and thickness measured to be 27  mm. Free hand resection is taken to 15 mm, a 41 template  is placed, lug holes are drilled, trial patella is placed, and it tracks normally. Osteophytes are removed off the posterior femur with the trial in place. All trials are removed and the cut bone surfaces prepared with pulsatile lavage. Cement is mixed and once ready for implantation, the size 4 tibial implant, size  5 posterior stabilized femoral component, and the size 41 patella are cemented in place and the patella is held with the clamp. The trial insert is placed and the knee held in full extension. The Exparel (20 ml mixed with 60 ml saline) is injected into the extensor mechanism, posterior capsule, medial and lateral gutters and subcutaneous tissues.  All extruded cement is removed and once the cement is hard the permanent 10 mm posterior stabilized rotating platform insert is placed into the tibial tray.      The wound is copiously irrigated with saline solution and the extensor mechanism closed over a hemovac drain with #1 V-loc suture. The tourniquet is released for a total tourniquet time of 50  minutes. Flexion against gravity is 140 degrees and the patella tracks normally. Subcutaneous tissue is closed with 2.0 vicryl and subcuticular with running 4.0 Monocryl. The incision is cleaned and dried and steri-strips and a bulky sterile dressing are applied. The limb is placed into a knee immobilizer and the patient is awakened and transported to recovery in stable condition.      Please note that a surgical assistant was a medical necessity for this procedure in order to perform it in a safe and expeditious manner. Surgical assistant was necessary to retract the ligaments and vital neurovascular structures to prevent injury to them and also necessary for proper positioning of the limb to allow for anatomic placement of the prosthesis.   Wesley Plover Jaiah Weigel, MD    06/07/2019, 11:34 AM  '

## 2019-06-07 NOTE — Transfer of Care (Signed)
Immediate Anesthesia Transfer of Care Note  Patient: Wesley Fuller  Procedure(s) Performed: TOTAL KNEE ARTHROPLASTY (Left Knee)  Patient Location: PACU  Anesthesia Type:GA combined with regional for post-op pain  Level of Consciousness: drowsy  Airway & Oxygen Therapy: Patient Spontanous Breathing and Patient connected to face mask oxygen  Post-op Assessment: Report given to RN and Post -op Vital signs reviewed and stable  Post vital signs: Reviewed and stable  Last Vitals:  Vitals Value Taken Time  BP 166/77 06/07/19 1204  Temp 36.5 C 06/07/19 1204  Pulse 72 06/07/19 1208  Resp 16 06/07/19 1208  SpO2 98 % 06/07/19 1208  Vitals shown include unvalidated device data.  Last Pain:  Vitals:   06/07/19 0936  TempSrc:   PainSc: 0-No pain      Patients Stated Pain Goal: 4 (57/84/[id number])  Complications: No apparent anesthesia complications

## 2019-06-07 NOTE — Discharge Instructions (Signed)
° °Dr. Frank Aluisio °Total Joint Specialist °[facility] °[address] °[place], [place] [address] °[phone] ° °TOTAL KNEE REPLACEMENT POSTOPERATIVE DIRECTIONS ° °Knee Rehabilitation, Guidelines Following Surgery  °Results after knee surgery are often greatly improved when you follow the exercise, range of motion and muscle strengthening exercises prescribed by your doctor. Safety measures are also important to protect the knee from further injury. Any time any of these exercises cause you to have increased pain or swelling in your knee joint, decrease the amount until you are comfortable again and slowly increase them. If you have problems or questions, call your caregiver or physical therapist for advice.  ° °HOME CARE INSTRUCTIONS  °• Remove items at home which could result in a fall. This includes throw rugs or furniture in walking pathways.  °· ICE to the affected knee every three hours for 30 minutes at a time and then as needed for pain and swelling.  Continue to use ice on the knee for pain and swelling from surgery. You may notice swelling that will progress down to the foot and ankle.  This is normal after surgery.  Elevate the leg when you are not up walking on it.   °· Continue to use the breathing machine which will help keep your temperature down.  It is common for your temperature to cycle up and down following surgery, especially at night when you are not up moving around and exerting yourself.  The breathing machine keeps your lungs expanded and your temperature down. °· Do not place pillow under knee, focus on keeping the knee straight while resting ° °DIET °You may resume your previous home diet once your are discharged from the hospital. ° °DRESSING / WOUND CARE / SHOWERING °You may shower 3 days after surgery, but keep the wounds dry during showering.  You may use an occlusive plastic wrap (Press'n Seal for example), NO SOAKING/SUBMERGING IN THE BATHTUB.  If the bandage  gets wet, change with a clean dry gauze.  If the incision gets wet, pat the wound dry with a clean towel. °You may start showering once you are discharged home but do not submerge the incision under water. Just pat the incision dry and apply a dry gauze dressing on daily. °Change the surgical dressing daily and reapply a dry dressing each time. ° °ACTIVITY °Walk with your walker as instructed. °Use walker as long as suggested by your caregivers. °Avoid periods of inactivity such as sitting longer than an hour when not asleep. This helps prevent blood clots.  °You may resume a sexual relationship in one month or when given the OK by your doctor.  °You may return to work once you are cleared by your doctor.  °Do not drive a car for 6 weeks or until released by you surgeon.  °Do not drive while taking narcotics. ° °WEIGHT BEARING °Weight bearing as tolerated with assist device (walker, cane, etc) as directed, use it as long as suggested by your surgeon or therapist, typically at least 4-6 weeks. ° °POSTOPERATIVE CONSTIPATION PROTOCOL °Constipation - defined medically as fewer than three stools per week and severe constipation as less than one stool per week. ° °One of the most common issues patients have following surgery is constipation.  Even if you have a regular bowel pattern at home, your normal regimen is likely to be disrupted due to multiple reasons following surgery.  Combination of anesthesia, postoperative narcotics, change in appetite and fluid intake all can affect your bowels.    In order to avoid complications following surgery, here are some recommendations in order to help you during your recovery period. ° °Colace (docusate) - Pick up an over-the-counter form of Colace or another stool softener and take twice a day as long as you are requiring postoperative pain medications.  Take with a full glass of water daily.  If you experience loose stools or diarrhea, hold the colace until you stool forms back  up.  If your symptoms do not get better within 1 week or if they get worse, check with your doctor. ° °Dulcolax (bisacodyl) - Pick up over-the-counter and take as directed by the product packaging as needed to assist with the movement of your bowels.  Take with a full glass of water.  Use this product as needed if not relieved by Colace only.  ° °MiraLax (polyethylene glycol) - Pick up over-the-counter to have on hand.  MiraLax is a solution that will increase the amount of water in your bowels to assist with bowel movements.  Take as directed and can mix with a glass of water, juice, soda, coffee, or tea.  Take if you go more than two days without a movement. °Do not use MiraLax more than once per day. Call your doctor if you are still constipated or irregular after using this medication for 7 days in a row. ° °If you continue to have problems with postoperative constipation, please contact the office for further assistance and recommendations.  If you experience "the worst abdominal pain ever" or develop nausea or vomiting, please contact the office immediatly for further recommendations for treatment. ° °ITCHING ° If you experience itching with your medications, try taking only a single pain pill, or even half a pain pill at a time.  You can also use Benadryl over the counter for itching or also to help with sleep.  ° °TED HOSE STOCKINGS °Wear the elastic stockings on both legs for three weeks following surgery during the day but you may remove then at night for sleeping. ° °MEDICATIONS °See your medication summary on the “After Visit Summary” that the nursing staff will review with you prior to discharge.  You may have some home medications which will be placed on hold until you complete the course of blood thinner medication.  It is important for you to complete the blood thinner medication as prescribed by your surgeon.  Continue your approved medications as instructed at time of discharge. ° °PRECAUTIONS °If  you experience chest pain or shortness of breath - call 911 immediately for transfer to the hospital emergency department.  °If you develop a fever greater that 101 F, purulent drainage from wound, increased redness or drainage from wound, foul odor from the wound/dressing, or calf pain - CONTACT YOUR SURGEON.   °                                                °FOLLOW-UP APPOINTMENTS °Make sure you keep all of your appointments after your operation with your surgeon and caregivers. You should call the office at the above phone number and make an appointment for approximately two weeks after the date of your surgery or on the date instructed by your surgeon outlined in the "After Visit Summary". ° ° °RANGE OF MOTION AND STRENGTHENING EXERCISES  °Rehabilitation of the knee is important following a knee injury or   an operation. After just a few days of immobilization, the muscles of the thigh which control the knee become weakened and shrink (atrophy). Knee exercises are designed to build up the tone and strength of the thigh muscles and to improve knee motion. Often times heat used for twenty to thirty minutes before working out will loosen up your tissues and help with improving the range of motion but do not use heat for the first two weeks following surgery. These exercises can be done on a training (exercise) mat, on the floor, on a table or on a bed. Use what ever works the best and is most comfortable for you Knee exercises include:  °• Leg Lifts - While your knee is still immobilized in a splint or cast, you can do straight leg raises. Lift the leg to 60 degrees, hold for 3 sec, and slowly lower the leg. Repeat 10-20 times 2-3 times daily. Perform this exercise against resistance later as your knee gets better.  °• Quad and Hamstring Sets - Tighten up the muscle on the front of the thigh (Quad) and hold for 5-10 sec. Repeat this 10-20 times hourly. Hamstring sets are done by pushing the foot backward against an  object and holding for 5-10 sec. Repeat as with quad sets.  °· Leg Slides: Lying on your back, slowly slide your foot toward your buttocks, bending your knee up off the floor (only go as far as is comfortable). Then slowly slide your foot back down until your leg is flat on the floor again. °· Angel Wings: Lying on your back spread your legs to the side as far apart as you can without causing discomfort.  °A rehabilitation program following serious knee injuries can speed recovery and prevent re-injury in the future due to weakened muscles. Contact your doctor or a physical therapist for more information on knee rehabilitation.  ° °IF YOU ARE TRANSFERRED TO A SKILLED REHAB FACILITY °If the patient is transferred to a skilled rehab facility following release from the hospital, a list of the current medications will be sent to the facility for the patient to continue.  When discharged from the skilled rehab facility, please have the facility set up the patient's Home Health Physical Therapy prior to being released. Also, the skilled facility will be responsible for providing the patient with their medications at time of release from the facility to include their pain medication, the muscle relaxants, and their blood thinner medication. If the patient is still at the rehab facility at time of the two week follow up appointment, the skilled rehab facility will also need to assist the patient in arranging follow up appointment in our office and any transportation needs. ° °MAKE SURE YOU:  °• Understand these instructions.  °• Get help right away if you are not doing well or get worse.  ° ° °Pick up stool softner and laxative for home use following surgery while on pain medications. °Do not submerge incision under water. °Please use good hand washing techniques while changing dressing each day. °May shower starting three days after surgery. °Please use a clean towel to pat the incision dry following showers. °Continue to  use ice for pain and swelling after surgery. °Do not use any lotions or creams on the incision until instructed by your surgeon. ° °

## 2019-06-08 ENCOUNTER — Encounter (HOSPITAL_COMMUNITY): Payer: Self-pay | Admitting: Orthopedic Surgery

## 2019-06-08 DIAGNOSIS — M1712 Unilateral primary osteoarthritis, left knee: Secondary | ICD-10-CM | POA: Diagnosis not present

## 2019-06-08 DIAGNOSIS — Z85820 Personal history of malignant melanoma of skin: Secondary | ICD-10-CM | POA: Diagnosis not present

## 2019-06-08 DIAGNOSIS — Z7901 Long term (current) use of anticoagulants: Secondary | ICD-10-CM | POA: Diagnosis not present

## 2019-06-08 DIAGNOSIS — Z79899 Other long term (current) drug therapy: Secondary | ICD-10-CM | POA: Diagnosis not present

## 2019-06-08 DIAGNOSIS — I480 Paroxysmal atrial fibrillation: Secondary | ICD-10-CM | POA: Diagnosis not present

## 2019-06-08 DIAGNOSIS — M25762 Osteophyte, left knee: Secondary | ICD-10-CM | POA: Diagnosis not present

## 2019-06-08 DIAGNOSIS — I10 Essential (primary) hypertension: Secondary | ICD-10-CM | POA: Diagnosis not present

## 2019-06-08 LAB — BASIC METABOLIC PANEL
Anion gap: 12 (ref 5–15)
BUN: 15 mg/dL (ref 8–23)
CO2: 22 mmol/L (ref 22–32)
Calcium: 8.8 mg/dL — ABNORMAL LOW (ref 8.9–10.3)
Chloride: 107 mmol/L (ref 98–111)
Creatinine, Ser: 0.79 mg/dL (ref 0.61–1.24)
GFR calc Af Amer: >60 mL/min (ref >60)
GFR calc non Af Amer: >60 mL/min (ref >60)
Glucose, Bld: 124 mg/dL — ABNORMAL HIGH (ref 70–99)
Potassium: 4 mmol/L (ref 3.5–5.1)
Sodium: 141 mmol/L (ref 135–145)

## 2019-06-08 LAB — CBC
HCT: 42.6 % (ref 39.0–52.0)
Hemoglobin: 15.1 g/dL (ref 13.0–17.0)
MCH: 33.9 pg (ref 26.0–34.0)
MCHC: 35.4 g/dL (ref 30.0–36.0)
MCV: 95.5 fL (ref 80.0–100.0)
Platelets: 159 10*3/uL (ref 150–400)
RBC: 4.46 MIL/uL (ref 4.22–5.81)
RDW: 11.9 % (ref 11.5–15.5)
WBC: 17.7 10*3/uL — ABNORMAL HIGH (ref 4.0–10.5)
nRBC: 0 % (ref 0.0–0.2)

## 2019-06-08 MED ORDER — OXYCODONE HCL 5 MG PO TABS: 5.0000 mg | ORAL_TABLET | Freq: Four times a day (QID) | ORAL | 0 refills | Status: DC | PRN

## 2019-06-08 MED ORDER — TRAMADOL HCL 50 MG PO TABS: 50.0000 mg | ORAL_TABLET | Freq: Four times a day (QID) | ORAL | 0 refills | Status: DC | PRN

## 2019-06-08 MED ORDER — GABAPENTIN 100 MG PO CAPS: 200.0000 mg | ORAL_CAPSULE | Freq: Three times a day (TID) | ORAL | 0 refills | Status: DC

## 2019-06-08 MED ORDER — METHOCARBAMOL 500 MG PO TABS: 500.0000 mg | ORAL_TABLET | Freq: Four times a day (QID) | ORAL | 0 refills | Status: DC | PRN

## 2019-06-08 NOTE — Progress Notes (Signed)
Physical Therapy Treatment Patient Details Name: Wesley Fuller MRN: [id number] DOB: [birth year] Today's Date: 06/08/2019    History of Present Illness 78 yo male s/p L TKR on 06/07/19. PMH includes PAF, depression, MCI, OA, GERD, HTN.    PT Comments    Pt assisted to bathroom.  Spouse standing with pt (for urinating) and pt with LOB (attempted to wipe off toilet after urinating) however pt self corrected holding wall.  Spouse educated to hold pt's gait belt to provide steadying assist if needed and remain with pt during any standing and mobility initially.  Pt ambulated into hallway and practiced safe stair technique.  Pt also performed multiple sit to stands for instructions and spouse provided cues.  Pt and spouse reports understanding of safely mobilizing.  Pt provided with HEP handout.  Pt and spouse feel ready for d/c home today.   Follow Up Recommendations  Follow surgeon's recommendation for DC plan and follow-up therapies;Supervision for mobility/OOB     Equipment Recommendations  None recommended by PT    Recommendations for Other Services       Precautions / Restrictions Precautions Precautions: Fall;Knee Precaution Comments: pt and spouse educated on appopriate use of KI Required Braces or Orthoses: Knee Immobilizer - Left Knee Immobilizer - Left: On when out of bed or walking;Discontinue once straight leg raise with < 10 degree lag Restrictions Other Position/Activity Restrictions: WBAT    Mobility  Bed Mobility Overal bed mobility: Needs Assistance Bed Mobility: Supine to Sit     Supine to sit: Min guard;HOB elevated     General bed mobility comments: pt up in recliner  Transfers Overall transfer level: Needs assistance Equipment used: Rolling walker (2 wheeled) Transfers: Sit to/from Stand Sit to Stand: Min guard;From elevated surface;Min assist         General transfer comment: verbal cues for hand placement and LE positioning; performed 10  repetitions for education; spouse also providing cues  Ambulation/Gait Ambulation/Gait assistance: Min guard Gait Distance (Feet): 80 Feet Assistive device: Rolling walker (2 wheeled) Gait Pattern/deviations: Step-to pattern;Decreased step length - left;Decreased step length - right;Antalgic Gait velocity: decr   General Gait Details: Min guard for safety, verbal cuing for placement in and turning with RW, sequencing.   Stairs Stairs: Yes Stairs assistance: Min guard Stair Management: Step to pattern;Forwards;One rail Left Number of Stairs: 2 General stair comments: pt performed with one rail, cues for safety and sequence, performed with only left rail and then only right rail; spouse present and educated as well   Wheelchair Mobility    Modified Rankin (Stroke Patients Only)       Balance                                            Cognition Arousal/Alertness: Awake/alert Behavior During Therapy: WFL for tasks assessed/performed Overall Cognitive Status: Within Functional Limits for tasks assessed                                 General Comments: history of cognitive impairment, pleasantly confused, required repeated VC, but otherwise pt WFL with PT.      Exercises Total Joint Exercises Ankle Circles/Pumps: AROM;Both;10 reps Quad Sets: AROM;Left;10 reps Short Arc QuadSinclair Ship;Left;10 reps Heel Slides: AAROM;Left;10 reps Hip ABduction/ADduction: AROM;Left;10 reps Straight Leg Raises: 10 reps;Left;AAROM  General Comments        Pertinent Vitals/Pain Pain Assessment: Faces Faces Pain Scale: Hurts a little bit Pain Location: L knee Pain Descriptors / Indicators: Sore;Tightness Pain Intervention(s): Monitored during session;Repositioned    Home Living                      Prior Function            PT Goals (current goals can now be found in the care plan section) Progress towards PT goals: Progressing toward  goals    Frequency    7X/week      PT Plan Current plan remains appropriate    Co-evaluation              AM-PAC PT "6 Clicks" Mobility   Outcome Measure  Help needed turning from your back to your side while in a flat bed without using bedrails?: A Little Help needed moving from lying on your back to sitting on the side of a flat bed without using bedrails?: A Little Help needed moving to and from a bed to a chair (including a wheelchair)?: A Little Help needed standing up from a chair using your arms (e.g., wheelchair or bedside chair)?: A Little Help needed to walk in hospital room?: A Little Help needed climbing 3-5 steps with a railing? : A Little 6 Click Score: 18    End of Session Equipment Utilized During Treatment: Gait belt;Left knee immobilizer Activity Tolerance: Patient tolerated treatment well;No increased pain Patient left: in chair;with call bell/phone within reach;with family/visitor present Nurse Communication: Mobility status PT Visit Diagnosis: Other abnormalities of gait and mobility (R26.89);Difficulty in walking, not elsewhere classified (R26.2)     Time: 7915-0569 PT Time Calculation (min) (ACUTE ONLY): 26 min  Charges:  $Gait Training: 23-37 mins $Therapeutic Exercise: 8-22 mins                     Carmelia Bake, PT, DPT Acute Rehabilitation Services Office: [phone] Pager: [phone]  Trena Platt 06/08/2019, 3:51 PM

## 2019-06-08 NOTE — Progress Notes (Signed)
   Subjective: 1 Day Post-Op Procedure(s) (LRB): TOTAL KNEE ARTHROPLASTY (Left) Patient reports pain as mild.   Patient seen in rounds by Dr. Wynelle Link. Patient is well, and has had no acute complaints or problems other than pain in the left knee. Denies chest pain, SOB, or calf pain. Did well ambulating with therapy yesterday. Foley catheter removed this AM, no issues overnight.  We will continue therapy today.   Objective: Vital signs in last 24 hours: Temp:  [97.7 F (36.5 C)-99.1 F (37.3 C)] 98.5 F (36.9 C) (06/16 0542) Pulse Rate:  [53-80] 69 (06/16 0542) Resp:  [8-19] 19 (06/16 0542) BP: (127-166)/(67-93) 152/81 (06/16 0542) SpO2:  [95 %-100 %] 96 % (06/16 0542) Weight:  [86.4 kg] 86.4 kg (06/15 0838)  Intake/Output from previous day:  Intake/Output Summary (Last 24 hours) at 06/08/2019 0734 Last data filed at 06/08/2019 0600 Gross per 24 hour  Intake 3730.18 ml  Output 2040 ml  Net 1690.18 ml    Labs: Recent Labs    06/08/19 0320  HGB 15.1   Recent Labs    06/08/19 0320  WBC 17.7*  RBC 4.46  HCT 42.6  PLT 159   Recent Labs    06/08/19 0320  NA 141  K 4.0  CL 107  CO2 22  BUN 15  CREATININE 0.79  GLUCOSE 124*  CALCIUM 8.8*   Exam: General - Patient is Alert and Oriented Extremity - Neurologically intact Neurovascular intact Sensation intact distally Dorsiflexion/Plantar flexion intact Dressing - dressing C/D/I Motor Function - intact, moving foot and toes well on exam.   Past Medical History:  Diagnosis Date  . Allergy    seasonal  . Asthma    as a child  . Calcium oxalate renal stones   . Cataract 2013  . Diverticulosis   . Dysrhythmia    a fib  . ED (erectile dysfunction)   . GERD (gastroesophageal reflux disease)   . History of kidney stones   . Hypertension   . Melanoma of back ([place]) 1974  . Nail fungal infection   . Osteoarthritis of left knee 03/02/2014    Assessment/Plan: 1 Day Post-Op Procedure(s) (LRB): TOTAL KNEE  ARTHROPLASTY (Left) Principal Problem:   OA (osteoarthritis) of knee Active Problems:   Osteoarthritis of left knee  Estimated body mass index is 25.82 kg/m as calculated from the following:   Height as of this encounter: 6' (1.829 m).   Weight as of this encounter: 86.4 kg. Advance diet Up with therapy D/C IV fluids  Anticipated LOS equal to or greater than 2 midnights due to - Age 78 and older with one or more of the following:  - Obesity  - Expected need for hospital services (PT, OT, Nursing) required for safe  discharge  - Anticipated need for postoperative skilled nursing care or inpatient rehab  - Active co-morbidities: Cardiac Arrhythmia OR   - Unanticipated findings during/Post Surgery: None  - Patient is a high risk of re-admission due to: None    DVT Prophylaxis - Xarelto Weight bearing as tolerated. D/C O2 and pulse ox and try on room air. Hemovac pulled without difficulty, will continue therapy today.  Plan is to go Home after hospital stay. Possible discharge this afternoon if progresses with therapy and meeting his goals. Scheduled for outpatient physical therapy at [facility]. Follow-up in the office in 2 weeks.   Theresa Duty, PA-C Orthopedic Surgery 06/08/2019, 7:34 AM

## 2019-06-08 NOTE — Progress Notes (Signed)
Physical Therapy Treatment Patient Details Name: Wesley Fuller MRN: [id number] DOB: 15-Jul-1941 Today's Date: 06/08/2019    History of Present Illness 78 yo male s/p L TKR on 06/07/19. PMH includes PAF, depression, MCI, OA, GERD, HTN.    PT Comments    Pt ambulated in hallway and performed LE exercises.  Spouse present and observed session.  Will return to practice steps prior to d/c home today.   Follow Up Recommendations  Follow surgeon's recommendation for DC plan and follow-up therapies;Supervision for mobility/OOB     Equipment Recommendations  None recommended by PT    Recommendations for Other Services       Precautions / Restrictions Precautions Precautions: Fall;Knee Required Braces or Orthoses: Knee Immobilizer - Left Knee Immobilizer - Left: On when out of bed or walking;Discontinue once straight leg raise with < 10 degree lag Restrictions Other Position/Activity Restrictions: WBAT    Mobility  Bed Mobility Overal bed mobility: Needs Assistance Bed Mobility: Supine to Sit     Supine to sit: Min guard;HOB elevated     General bed mobility comments: Increased time and effort.  Transfers Overall transfer level: Needs assistance Equipment used: Rolling walker (2 wheeled) Transfers: Sit to/from Stand Sit to Stand: Min guard;From elevated surface         General transfer comment: verbal cues for hand placement and LE positioning  Ambulation/Gait Ambulation/Gait assistance: Min guard Gait Distance (Feet): 120 Feet Assistive device: Rolling walker (2 wheeled) Gait Pattern/deviations: Step-to pattern;Decreased step length - left;Decreased step length - right;Antalgic Gait velocity: decr   General Gait Details: Min guard for safety, verbal cuing for placement in and turning with RW, sequencing.   Stairs             Wheelchair Mobility    Modified Rankin (Stroke Patients Only)       Balance                                             Cognition Arousal/Alertness: Awake/alert Behavior During Therapy: WFL for tasks assessed/performed Overall Cognitive Status: Within Functional Limits for tasks assessed                                 General Comments: history of cognitive impairment, pleasantly confused, required repeated VC, but otherwise pt WFL with PT.      Exercises Total Joint Exercises Ankle Circles/Pumps: AROM;Both;10 reps Quad Sets: AROM;Left;10 reps Short Arc QuadSinclair Ship;Left;10 reps Heel Slides: AAROM;Left;10 reps Hip ABduction/ADduction: AROM;Left;10 reps Straight Leg Raises: 10 reps;Left;AAROM    General Comments        Pertinent Vitals/Pain Pain Assessment: Faces(pt reports 7/10 however does not appear in severe pain) Faces Pain Scale: Hurts little more Pain Location: L knee Pain Descriptors / Indicators: Sore;Tightness Pain Intervention(s): Monitored during session;Limited activity within patient's tolerance;Repositioned    Home Living                      Prior Function            PT Goals (current goals can now be found in the care plan section) Progress towards PT goals: Progressing toward goals    Frequency    7X/week      PT Plan Current plan remains appropriate    Co-evaluation  AM-PAC PT "6 Clicks" Mobility   Outcome Measure  Help needed turning from your back to your side while in a flat bed without using bedrails?: A Little Help needed moving from lying on your back to sitting on the side of a flat bed without using bedrails?: A Little Help needed moving to and from a bed to a chair (including a wheelchair)?: A Little Help needed standing up from a chair using your arms (e.g., wheelchair or bedside chair)?: A Little Help needed to walk in hospital room?: A Little Help needed climbing 3-5 steps with a railing? : A Little 6 Click Score: 18    End of Session Equipment Utilized During Treatment: Gait belt;Left  knee immobilizer Activity Tolerance: Patient tolerated treatment well;No increased pain Patient left: in chair;with call bell/phone within reach;with family/visitor present Nurse Communication: Mobility status PT Visit Diagnosis: Other abnormalities of gait and mobility (R26.89);Difficulty in walking, not elsewhere classified (R26.2)     Time: 5427-0623 PT Time Calculation (min) (ACUTE ONLY): 27 min  Charges:  $Gait Training: 8-22 mins $Therapeutic Exercise: 8-22 mins                     Carmelia Bake, PT, DPT Acute Rehabilitation Services Office: [phone] Pager: [phone]  Trena Platt 06/08/2019, 1:20 PM

## 2019-06-10 DIAGNOSIS — M25562 Pain in left knee: Secondary | ICD-10-CM | POA: Diagnosis not present

## 2019-06-13 ENCOUNTER — Encounter: Payer: Self-pay | Admitting: Family Medicine

## 2019-06-13 ENCOUNTER — Other Ambulatory Visit: Payer: Self-pay | Admitting: Cardiology

## 2019-06-14 ENCOUNTER — Other Ambulatory Visit: Payer: Self-pay

## 2019-06-14 DIAGNOSIS — M25562 Pain in left knee: Secondary | ICD-10-CM | POA: Diagnosis not present

## 2019-06-14 MED ORDER — RIVAROXABAN 20 MG PO TABS: 20.0000 mg | ORAL_TABLET | Freq: Every day | ORAL | 2 refills | Status: DC

## 2019-06-14 NOTE — Telephone Encounter (Signed)
Xarelto refill sent 

## 2019-06-16 DIAGNOSIS — M25562 Pain in left knee: Secondary | ICD-10-CM | POA: Diagnosis not present

## 2019-06-18 DIAGNOSIS — M25562 Pain in left knee: Secondary | ICD-10-CM | POA: Diagnosis not present

## 2019-06-21 DIAGNOSIS — M25562 Pain in left knee: Secondary | ICD-10-CM | POA: Diagnosis not present

## 2019-06-23 DIAGNOSIS — M25562 Pain in left knee: Secondary | ICD-10-CM | POA: Diagnosis not present

## 2019-06-24 DIAGNOSIS — M25562 Pain in left knee: Secondary | ICD-10-CM | POA: Diagnosis not present

## 2019-06-29 DIAGNOSIS — M25562 Pain in left knee: Secondary | ICD-10-CM | POA: Diagnosis not present

## 2019-07-01 DIAGNOSIS — M25562 Pain in left knee: Secondary | ICD-10-CM | POA: Diagnosis not present

## 2019-07-06 DIAGNOSIS — M25562 Pain in left knee: Secondary | ICD-10-CM | POA: Diagnosis not present

## 2019-07-08 DIAGNOSIS — M25562 Pain in left knee: Secondary | ICD-10-CM | POA: Diagnosis not present

## 2019-07-12 DIAGNOSIS — M25562 Pain in left knee: Secondary | ICD-10-CM | POA: Diagnosis not present

## 2019-07-13 DIAGNOSIS — Z96652 Presence of left artificial knee joint: Secondary | ICD-10-CM | POA: Diagnosis not present

## 2019-07-13 DIAGNOSIS — Z471 Aftercare following joint replacement surgery: Secondary | ICD-10-CM | POA: Diagnosis not present

## 2019-07-15 DIAGNOSIS — M25562 Pain in left knee: Secondary | ICD-10-CM | POA: Diagnosis not present

## 2019-07-20 DIAGNOSIS — M25562 Pain in left knee: Secondary | ICD-10-CM | POA: Diagnosis not present

## 2019-07-22 DIAGNOSIS — M25562 Pain in left knee: Secondary | ICD-10-CM | POA: Diagnosis not present

## 2019-07-28 ENCOUNTER — Other Ambulatory Visit: Payer: Self-pay | Admitting: Family Medicine

## 2019-07-29 ENCOUNTER — Encounter: Payer: Self-pay | Admitting: Family Medicine

## 2019-07-29 DIAGNOSIS — M25562 Pain in left knee: Secondary | ICD-10-CM | POA: Diagnosis not present

## 2019-07-30 ENCOUNTER — Encounter: Payer: Self-pay | Admitting: Physician Assistant

## 2019-07-30 ENCOUNTER — Other Ambulatory Visit: Payer: Self-pay

## 2019-07-30 ENCOUNTER — Other Ambulatory Visit: Payer: Medicare HMO

## 2019-07-30 ENCOUNTER — Ambulatory Visit (INDEPENDENT_AMBULATORY_CARE_PROVIDER_SITE_OTHER): Payer: Medicare HMO | Admitting: Physician Assistant

## 2019-07-30 DIAGNOSIS — R350 Frequency of micturition: Secondary | ICD-10-CM

## 2019-07-30 LAB — POCT URINALYSIS DIPSTICK
Bilirubin, UA: NEGATIVE
Blood, UA: NEGATIVE
Glucose, UA: NEGATIVE
Ketones, UA: NEGATIVE
Leukocytes, UA: NEGATIVE
Nitrite, UA: NEGATIVE
Protein, UA: NEGATIVE
Spec Grav, UA: 1.02 (ref 1.010–1.025)
Urobilinogen, UA: 0.2 E.U./dL
pH, UA: 6 (ref 5.0–8.0)

## 2019-07-30 MED ORDER — TAMSULOSIN HCL 0.4 MG PO CAPS: 0.4000 mg | ORAL_CAPSULE | Freq: Every day | ORAL | 0 refills | Status: DC

## 2019-07-30 NOTE — Progress Notes (Signed)
Virtual Visit via Video   I connected with Wesley Fuller on 07/30/19 at  3:40 PM EDT by a video enabled telemedicine application and verified that I am speaking with the correct person using two identifiers. Location patient: Home Location provider: [facility], Office Persons participating in the virtual visit: RAYSHAWN MANEY, Nedim Oki PA-C, Devinn Hurwitz (wife)  I discussed the limitations of evaluation and management by telemedicine and the availability of in person appointments. The patient expressed understanding and agreed to proceed.  I acted as a Education administrator for [employer], PA-C [employer], LPN  Subjective:   HPI:   Urinary Frequency Pt c/o frequency and nocturia, getting up every hour at night since June 15 th pt had Knee replacement surgery. Denies dysuria or back pain. He is having urgency and urge incontinence. Denies fever, chills, abdominal pain, no nausea or vomiting.  BP Readings from Last 3 Encounters:  06/08/19 131/70  06/02/19 138/76  05/25/19 (!) 147/80   Prostate Symptoms Questionnaire: 1. Have you had the sensation of not emptying your bladder completely after you finished urinating? Yes.   2. Have you had to urinate again less than two hours after you finished urinating? Yes.   3. Have you found you stopped and started again several times when you urinated? Yes.   4. Have you found it difficult to postpone urination? Yes.   5. Have you had a weak urinary stream? Yes.   6. Have you had to push or strain to begin urination? No. 7. How many times did you most typically get up to urinate from the time you went to bed at night until the time you got up in the morning? 12  ROS: See pertinent positives and negatives per HPI.  Patient Active Problem List   Diagnosis Date Noted  . Osteoarthritis of left knee 06/07/2019  . Pre-operative cardiovascular examination 05/12/2019  . BPH associated with nocturia 02/22/2019  . Pain in left knee 01/28/2019   . Paroxysmal atrial fibrillation ([place]) 08/05/2018  . Depression, major, single episode, mild ([place]) 06/01/2018  . Low blood pressure reading 04/13/2018  . Depression 03/23/2018  . Hyperglycemia 10/15/2016  . Asymmetric SNHL (sensorineural hearing loss) 04/11/2016  . MCI (mild cognitive impairment) 08/22/2015  . OA (osteoarthritis) of knee 03/02/2014  . VARICOSE VEINS LOWER EXTREMITIES W/INFLAMMATION 02/20/2010  . GERD 11/21/2008  . Actinic keratosis 10/12/2008  . ERECTILE DYSFUNCTION 02/09/2008  . Essential hypertension 02/09/2008  . History of melanoma 02/09/2008  . RENAL CALCULUS, HX OF 02/09/2008    Social History   Tobacco Use  . Smoking status: Never Smoker  . Smokeless tobacco: Never Used  Substance Use Topics  . Alcohol use: Yes    Alcohol/week: 5.0 standard drinks    Types: 5 Glasses of wine per week    Comment: does not drink very much; glass of wine every day    Current Outpatient Medications:  .  amLODipine (NORVASC) 5 MG tablet, TAKE ONE TABLET BY MOUTH ONE TIME DAILY  AS DIRECTED (Patient taking differently: Take 5 mg by mouth daily. TAKE ONE TABLET BY MOUTH ONE TIME DAILY  AS DIRECTED), Disp: 90 tablet, Rfl: 0 .  Astaxanthin 4 MG CAPS, Take 4 mg by mouth at bedtime. , Disp: , Rfl:  .  CALCIUM PO, Take 1 tablet by mouth daily., Disp: , Rfl:  .  donepezil (ARICEPT) 10 MG tablet, Take 1 tablet (10 mg total) by mouth at bedtime., Disp: 90 tablet, Rfl: 3 .  hydrochlorothiazide (HYDRODIURIL) 12.5 MG tablet, TAKE ONE TABLET BY MOUTH ONE TIME DAILY  (Patient taking differently: Take 12.5 mg by mouth daily. ), Disp: 90 tablet, Rfl: 2 .  irbesartan (AVAPRO) 300 MG tablet, TAKE ONE TABLET BY MOUTH ONE TIME DAILY , Disp: 90 tablet, Rfl: 2 .  Melatonin 5 MG TABS, Take 5 mg by mouth at bedtime., Disp: , Rfl:  .  rivaroxaban (XARELTO) 20 MG TABS tablet, Take 1 tablet (20 mg total) by mouth daily with supper., Disp: 90 tablet, Rfl: 2 .  TURMERIC PO, Take 1,200 mg by mouth daily.,  Disp: , Rfl:  .  tamsulosin (FLOMAX) 0.4 MG CAPS capsule, Take 1 capsule (0.4 mg total) by mouth daily., Disp: 30 capsule, Rfl: 0  No Known Allergies  Objective:   VITALS: Per patient if applicable, see vitals. GENERAL: Alert, appears well and in no acute distress. HEENT: Atraumatic, conjunctiva clear, no obvious abnormalities on inspection of external nose and ears. NECK: Normal movements of the head and neck. CARDIOPULMONARY: No increased WOB. Speaking in clear sentences. I:E ratio WNL.  MS: Moves all visible extremities without noticeable abnormality. PSYCH: Pleasant and cooperative, well-groomed. Speech normal rate and rhythm. Affect is appropriate. Insight and judgement are appropriate. Attention is focused, linear, and appropriate.  NEURO: CN grossly intact. Oriented as arrived to appointment on time with no prompting. Moves both UE equally.  SKIN: No obvious lesions, wounds, erythema, or cyanosis noted on face or hands.  Results for orders placed or performed in visit on 07/30/19  POCT urinalysis dipstick  Result Value Ref Range   Color, UA Yellow    Clarity, UA Clear    Glucose, UA Negative Negative   Bilirubin, UA Negative    Ketones, UA Negative    Spec Grav, UA 1.020 1.010 - 1.025   Blood, UA Negative    pH, UA 6.0 5.0 - 8.0   Protein, UA Negative Negative   Urobilinogen, UA 0.2 0.2 or 1.0 E.U./dL   Nitrite, UA Negative    Leukocytes, UA Negative Negative   Appearance     Odor      Assessment and Plan:   Otto was seen today for urinary frequency.  Diagnoses and all orders for this visit:  Urinary frequency Urinalysis was negative. No signs of infection. Will send off for culture. I think that this is a manifestation of his enlarged prostate, as he feels like he is not emptying his bladder completely. Will trial Flomax, did give precautions of orthostatic hypotension or dizziness while on this medication. If fails treatment, will have him reach out to his  urologist.  -     POCT urinalysis dipstick -     Urine Culture  Other orders -     tamsulosin (FLOMAX) 0.4 MG CAPS capsule; Take 1 capsule (0.4 mg total) by mouth daily.    . Reviewed expectations re: course of current medical issues. . Discussed self-management of symptoms. . Outlined signs and symptoms indicating need for more acute intervention. . Patient verbalized understanding and all questions were answered. Marland Kitchen Health Maintenance issues including appropriate healthy diet, exercise, and smoking avoidance were discussed with patient. . See orders for this visit as documented in the electronic medical record.  I discussed the assessment and treatment plan with the patient. The patient was provided an opportunity to ask questions and all were answered. The patient agreed with the plan and demonstrated an understanding of the instructions.   The patient was advised to call back or  seek an in-person evaluation if the symptoms worsen or if the condition fails to improve as anticipated.   CMA or LPN served as scribe during this visit. History, Physical, and Plan performed by medical provider. The above documentation has been reviewed and is accurate and complete.   [place], [place] 07/30/2019

## 2019-07-31 LAB — URINE CULTURE
MICRO NUMBER:: 748791
Result:: NO GROWTH
SPECIMEN QUALITY:: ADEQUATE

## 2019-08-04 DIAGNOSIS — M25562 Pain in left knee: Secondary | ICD-10-CM | POA: Diagnosis not present

## 2019-08-06 ENCOUNTER — Encounter: Payer: Self-pay | Admitting: Family Medicine

## 2019-08-06 ENCOUNTER — Encounter: Payer: Self-pay | Admitting: Physician Assistant

## 2019-08-06 DIAGNOSIS — M25562 Pain in left knee: Secondary | ICD-10-CM | POA: Diagnosis not present

## 2019-08-09 NOTE — Telephone Encounter (Signed)
Spoke to pt spouse [name] and gave Dr. Ansel Bong response " Happy to see patient for a visit if needed to look into causes of fatigue.  I am not the biggest panel in [place]. Could try 1000 mcg vitamin B12 per day and 1000 units of vitamin D per day if not taking currently to see if this helps"  [name] stated she will try the vit D and B12 to see if this helps. [name] stated if this doesn't help she will call the office back to schedule an appt.

## 2019-08-10 DIAGNOSIS — M25562 Pain in left knee: Secondary | ICD-10-CM | POA: Diagnosis not present

## 2019-08-12 ENCOUNTER — Encounter: Payer: Self-pay | Admitting: Family Medicine

## 2019-08-13 DIAGNOSIS — M25562 Pain in left knee: Secondary | ICD-10-CM | POA: Diagnosis not present

## 2019-08-16 DIAGNOSIS — M25562 Pain in left knee: Secondary | ICD-10-CM | POA: Diagnosis not present

## 2019-08-18 DIAGNOSIS — R3915 Urgency of urination: Secondary | ICD-10-CM | POA: Diagnosis not present

## 2019-08-18 DIAGNOSIS — R350 Frequency of micturition: Secondary | ICD-10-CM | POA: Diagnosis not present

## 2019-08-18 DIAGNOSIS — R351 Nocturia: Secondary | ICD-10-CM | POA: Diagnosis not present

## 2019-08-19 ENCOUNTER — Encounter: Payer: Self-pay | Admitting: Neurology

## 2019-08-19 ENCOUNTER — Telehealth (INDEPENDENT_AMBULATORY_CARE_PROVIDER_SITE_OTHER): Payer: Medicare HMO | Admitting: Neurology

## 2019-08-19 ENCOUNTER — Other Ambulatory Visit: Payer: Self-pay

## 2019-08-19 DIAGNOSIS — F0390 Unspecified dementia without behavioral disturbance: Secondary | ICD-10-CM

## 2019-08-19 DIAGNOSIS — R69 Illness, unspecified: Secondary | ICD-10-CM | POA: Diagnosis not present

## 2019-08-19 DIAGNOSIS — F03A0 Unspecified dementia, mild, without behavioral disturbance, psychotic disturbance, mood disturbance, and anxiety: Secondary | ICD-10-CM

## 2019-08-19 NOTE — Progress Notes (Signed)
Virtual Visit via Video Note The purpose of this virtual visit is to provide medical care while limiting exposure to the novel coronavirus.    Consent was obtained for video visit:  Yes.   Answered questions that patient had about telehealth interaction:  Yes.   I discussed the limitations, risks, security and privacy concerns of performing an evaluation and management service by telemedicine. I also discussed with the patient that there may be a patient responsible charge related to this service. The patient expressed understanding and agreed to proceed.  Pt location: Home Physician Location: office Name of referring provider:  Marin Olp, MD I connected with Wesley Fuller at patients initiation/request on 08/19/2019 at  9:30 AM EDT by video enabled telemedicine application and verified that I am speaking with the correct person using two identifiers. Pt MRN:  [id number] Pt DOB:  1941/06/23 Video Participants:  Wesley Fuller;  Wesley Fuller (wife)   History of Present Illness:  The patient was seen as a virtual video visit on 08/19/2019. He was last seen 3 months ago and presents for an earlier visit. [place] 26/30 in May 2020. His wife is concerned that since his knee replacement surgery under general anesthesia last June 15, she immediately noticed a big changes in short-term memory. She states that he had delirium after surgery, hallucinating for a little while. He was only admitted overnight, then hallucinations cleared up soon after he got home. His wife reports that he has a lot of appointments with doctors and physical therapy but they do not stick, he would ask repeatedly when they were. He kept forgetting that their 55th anniversary is tomorrow. He had been managing his own medications but after the surgery he could not remember if he already took his dose, his wife started fixing his medications. His wife does 99% of driving, she has noticed some spatial difficulties where he gets  close on one side or gets confused sometimes. His wife has noticed he shuffles more when walking, hunched down more, and does not exude confidence like before with a little hesitation. He continues to work with PT, no recent falls. No tremors, headaches, dizziness, focal numbness/tingling/weakness. His wife has noticed a lack of energy, enthusiasm and pep after the surgery. Sleep and appetite are okay. He has been involved in a research trial at [facility] for dementia and will resume next month. He is taking Donepezil 10mg  daily without side effects.   History on Initial Assessment 11/29/2015: This is a very pleasant 78 year old right-handed man with a history of hypertension, presenting for evaluation of worsening memory. He started noticing memory changes over the past 9-12 months, however his wife reports noticing changes around 2-1/2 years ago. He mostly notices difficulties with name and word retrieval, as well as misplacing things at home. His wife reports that they were playing bridge 2-1/2 years ago and he got confused with what cards to play. He does not recall this incident. Another time they were visiting their son in [place] and kept changing locations, but he would be confused that whole week as to what day it was or could not recall where they should be. Last September, he was driving out of state for a golf game that he attends once a year, but became so anxious about his drive and uncertain despite being given directions. His wife states it is out of character for him to be anxious. His wife reports that despite writing things down, he would get confused  if he said yes to a commitment. He is more unsure of himself on the computer and cellphone, asking his wife to check if an email he wrote looked okay. He has occasional word-finding difficulties and has always had problems multitasking. He denies getting lost driving, no missed medications or bill payments, although his wife pays majority of bills. He  reports an odd instance 2-1/2 weeks ago while out of town, he went to bed early and thought he was dreaming that he was in his house but it looked different, he got up and asked people what was going on, thinking he was still dreaming, but was later on told by friends that he did come and ask them this. He was more tired that day from driving long distance. His wife reports that she notices more confusion and memory issues when he is tired. MMSE at PCP office last 08/22/15 was 27/30. He was offered Aricept but did not start this, instead they have been to [facility] for consideration of joining a research study. He underwent neurocognitive testing but they have not heard results yet.  He denies any headaches, dizziness, diplopia, dysarthria, dysphagia, neck/back pain, focal numbness/tingling/weakness, tremors, no falls. He has a change in stool consistency. He reports chronic poor sense of smell. No REM behavior disorder. He denies any significant head injuries. His brother has Parkinson's dementia and had a good response to Aricept. He drinks one glass of wine a night, 1-2 drinks occasionally on weekends.     Current Outpatient Medications on File Prior to Visit  Medication Sig Dispense Refill   amLODipine (NORVASC) 5 MG tablet TAKE ONE TABLET BY MOUTH ONE TIME DAILY  AS DIRECTED (Patient taking differently: Take 5 mg by mouth daily. TAKE ONE TABLET BY MOUTH ONE TIME DAILY  AS DIRECTED) 90 tablet 0   Astaxanthin 4 MG CAPS Take 4 mg by mouth at bedtime.      CALCIUM PO Take 1 tablet by mouth daily.     donepezil (ARICEPT) 10 MG tablet Take 1 tablet (10 mg total) by mouth at bedtime. 90 tablet 3   hydrochlorothiazide (HYDRODIURIL) 12.5 MG tablet TAKE ONE TABLET BY MOUTH ONE TIME DAILY  (Patient taking differently: Take 12.5 mg by mouth daily. ) 90 tablet 2   irbesartan (AVAPRO) 300 MG tablet TAKE ONE TABLET BY MOUTH ONE TIME DAILY  90 tablet 2   Melatonin 5 MG TABS Take 5 mg by mouth at bedtime.      rivaroxaban (XARELTO) 20 MG TABS tablet Take 1 tablet (20 mg total) by mouth daily with supper. 90 tablet 2   tamsulosin (FLOMAX) 0.4 MG CAPS capsule Take 1 capsule (0.4 mg total) by mouth daily. 30 capsule 0   TURMERIC PO Take 1,200 mg by mouth daily.     No current facility-administered medications on file prior to visit.      Observations/Objective:   GEN:  The patient appears stated age and is in NAD.  Neurological examination: Patient is awake, alert, oriented x 3. No aphasia or dysarthria. Reduced fluency, able to follow commands. Remote and recent memory impaired. Able to name, difficulty with repetition. Cranial nerves: Extraocular movements intact with no nystagmus. No facial asymmetry. Motor: moves all extremities symmetrically, at least anti-gravity x 4. No incoordination on finger to nose testing. Gait: narrow-based and steady, able to tandem walk adequately. Negative Romberg test.  [facility] Cognitive Assessment  08/19/2019 04/23/2019 08/31/2018 11/29/2015  Visuospatial/ Executive (0/5) 5 4 3 5   Naming (0/3) 3  3 3 3   Attention: Read list of digits (0/2) 2 2 1 2   Attention: Read list of letters (0/1) 1 1 1 1   Attention: Serial 7 subtraction starting at 100 (0/3) 3 3 2 3   Language: Repeat phrase (0/2) 0 2 2 2   Language : Fluency (0/1) 0 1 1 1   Abstraction (0/2) 1 2 2 2   Delayed Recall (0/5) 1 2 2 2   Orientation (0/6) 5 6 5 6   Total 21 26 22 27   Adjusted Score (based on education) - - - 27    Assessment and Plan:   This is a very pleasant 78 yo RH man with hypertension, atrial fibrillation, and mild dementia. His wife requested for an earlier visit due to significant memory change after knee surgery last June 2020. MOCA score today 21/30, which is a decline from last visit 3 months ago (26/30 in May 2020, 22/30 in Sept 2019, 27/30 in December 2016). He will be participating in a drug trial for Aducanumab at [facility] for dementia next month. Discussed would not change  medications at this point with upcoming study, which his wife agrees with. Continue Donepezil 10mg  daily. Depression may also play a role in recent memory decline, continue to monitor. We again discussed the importance of control of vascular risk factors, physical exercise, and brain stimulation exercises for brain health. He will follow-up as scheduled in January 2021, they know to call for any changes.    Follow Up Instructions:   -I discussed the assessment and treatment plan with the patient. The patient was provided an opportunity to ask questions and all were answered. The patient agreed with the plan and demonstrated an understanding of the instructions.   The patient was advised to call back or seek an in-person evaluation if the symptoms worsen or if the condition fails to improve as anticipated.     Cameron Sprang, MD

## 2019-08-23 DIAGNOSIS — M25562 Pain in left knee: Secondary | ICD-10-CM | POA: Diagnosis not present

## 2019-08-26 ENCOUNTER — Encounter: Payer: Self-pay | Admitting: Physician Assistant

## 2019-08-26 DIAGNOSIS — M25562 Pain in left knee: Secondary | ICD-10-CM | POA: Diagnosis not present

## 2019-08-26 MED ORDER — TAMSULOSIN HCL 0.4 MG PO CAPS: 0.4000 mg | ORAL_CAPSULE | Freq: Every day | ORAL | 0 refills | Status: DC

## 2019-09-01 ENCOUNTER — Other Ambulatory Visit: Payer: Self-pay | Admitting: Family Medicine

## 2019-09-01 DIAGNOSIS — R69 Illness, unspecified: Secondary | ICD-10-CM | POA: Diagnosis not present

## 2019-09-02 DIAGNOSIS — M25562 Pain in left knee: Secondary | ICD-10-CM | POA: Diagnosis not present

## 2019-09-06 ENCOUNTER — Encounter: Payer: Self-pay | Admitting: Family Medicine

## 2019-09-06 ENCOUNTER — Telehealth: Payer: Self-pay

## 2019-09-06 NOTE — Telephone Encounter (Signed)
Patient is in an alzheimers study at [facility] and his lab results were abnormal. [name] would like Dr. Docia Furl to review information and respond. Copy of labs forwarded to Dr. Docia Furl by RN.

## 2019-09-06 NOTE — Telephone Encounter (Signed)
What information is she referring to

## 2019-09-06 NOTE — Telephone Encounter (Signed)
Copied from [place] [id number]. Topic: General - Other >> Sep 06, 2019 11:52 AM Sheran Luz wrote: Patient's wife requesting call back from [facility] to discuss results from [facility] regarding alzheimer's test. Wife was unable to give any other information.

## 2019-09-07 ENCOUNTER — Telehealth: Payer: Self-pay

## 2019-09-07 ENCOUNTER — Telehealth: Payer: Self-pay | Admitting: Cardiology

## 2019-09-07 DIAGNOSIS — I480 Paroxysmal atrial fibrillation: Secondary | ICD-10-CM

## 2019-09-07 NOTE — Telephone Encounter (Signed)
Called wife, scheduled visit with Dr. Yong Channel for 09/09/19 at 4:00 PM.

## 2019-09-07 NOTE — Telephone Encounter (Signed)
Wesley Olp, MD  [facility] 16 hours ago (8:28 PM)   Would recommend office visit-I would likely repeat his blood work-please make sure he is not taking Coumadin-typically Coumadin is what increase his PT/INR numbers-Xarelto should not do that.  Thanks, Garret Reddish

## 2019-09-07 NOTE — Telephone Encounter (Signed)
New message   Patient's wife states that the patient is on rivaroxaban (XARELTO) 20 MG TABS tablet and he is in a study at [facility] for altheimers. [facility] wants to know if he can take 325 mg of baby aspirin so that he can be in the study. Please call to discuss.

## 2019-09-07 NOTE — Telephone Encounter (Signed)
Called pt and left VM to call the office.  

## 2019-09-07 NOTE — Telephone Encounter (Signed)
Copied from [place] [phone]. Topic: General - Other >> Sep 07, 2019 10:18 AM Burchel, Abbi R wrote: Reason for CRM: Pt's wife returning a call to [place].  Please call pt: [phone]

## 2019-09-08 NOTE — Telephone Encounter (Signed)
He has paroxysmal atrial fibrillation and by the guidelines he needs Xarelto or Eliquis.  Either one is fine.  Aspirin will not help his condition.  These medications protect him from having a stroke.  About his PT please check with Tiffany if Xarelto has any effect on the PT.  I know it and may increase it a little bit but his PT is significantly elevated and I do not have a clear answer for this so maybe our Coumadin clinic can give [place] some help on this issue.

## 2019-09-08 NOTE — Telephone Encounter (Signed)
Information relayed to patient and Dr. Docia Furl spoke wto Mrs. Stromgren directly. Decision was made to send patient a 30 day event monitor due to his paroxysmal A-Fb. Copy of note sent to [employer] to order device. Physical address verified.

## 2019-09-08 NOTE — Telephone Encounter (Signed)
He is not my patient, needs to go to dr Geraldo Pitter

## 2019-09-09 ENCOUNTER — Ambulatory Visit: Payer: Medicare HMO | Admitting: Family Medicine

## 2019-09-09 DIAGNOSIS — M1712 Unilateral primary osteoarthritis, left knee: Secondary | ICD-10-CM | POA: Diagnosis not present

## 2019-09-09 DIAGNOSIS — M25662 Stiffness of left knee, not elsewhere classified: Secondary | ICD-10-CM | POA: Diagnosis not present

## 2019-09-09 NOTE — Telephone Encounter (Signed)
Left message that no samples available with message to text [phone] "[employer]" or call [phone] for patient assistance.

## 2019-09-13 DIAGNOSIS — M25562 Pain in left knee: Secondary | ICD-10-CM | POA: Diagnosis not present

## 2019-09-14 DIAGNOSIS — R350 Frequency of micturition: Secondary | ICD-10-CM | POA: Diagnosis not present

## 2019-09-14 DIAGNOSIS — R351 Nocturia: Secondary | ICD-10-CM | POA: Diagnosis not present

## 2019-09-14 DIAGNOSIS — N200 Calculus of kidney: Secondary | ICD-10-CM | POA: Diagnosis not present

## 2019-09-16 DIAGNOSIS — M25562 Pain in left knee: Secondary | ICD-10-CM | POA: Diagnosis not present

## 2019-09-17 ENCOUNTER — Telehealth: Payer: Self-pay | Admitting: Cardiology

## 2019-09-17 NOTE — Telephone Encounter (Signed)
States she was to "touch base" with you from last week

## 2019-09-20 ENCOUNTER — Telehealth: Payer: Self-pay | Admitting: Cardiology

## 2019-09-20 DIAGNOSIS — Z961 Presence of intraocular lens: Secondary | ICD-10-CM | POA: Diagnosis not present

## 2019-09-20 DIAGNOSIS — Z0100 Encounter for examination of eyes and vision without abnormal findings: Secondary | ICD-10-CM | POA: Diagnosis not present

## 2019-09-20 DIAGNOSIS — H52203 Unspecified astigmatism, bilateral: Secondary | ICD-10-CM | POA: Diagnosis not present

## 2019-09-20 NOTE — Telephone Encounter (Signed)
Has not received monitor yet 

## 2019-09-20 NOTE — Telephone Encounter (Signed)
Per Ledell Noss, patient should receive monitor this week on Thurs or Friday. RN left message for patient to call back for tracking info if it does not arrive.

## 2019-09-20 NOTE — Telephone Encounter (Signed)
Enrolled pt in [employer] today 9/28. Bartolo Darter will call wife and let her know monitor has been ordered and should get before the end of the week.

## 2019-09-23 ENCOUNTER — Telehealth: Payer: Self-pay | Admitting: Cardiology

## 2019-09-23 NOTE — Telephone Encounter (Signed)
Patient wife called and he has not received the vest yet.Wesley Fuller Please call

## 2019-09-24 ENCOUNTER — Telehealth: Payer: Self-pay | Admitting: Cardiology

## 2019-09-24 NOTE — Telephone Encounter (Signed)
Wife called back this afternoon and still does not have monitor.

## 2019-09-28 ENCOUNTER — Ambulatory Visit (INDEPENDENT_AMBULATORY_CARE_PROVIDER_SITE_OTHER): Payer: Medicare HMO

## 2019-09-28 DIAGNOSIS — I480 Paroxysmal atrial fibrillation: Secondary | ICD-10-CM

## 2019-09-29 ENCOUNTER — Telehealth: Payer: Self-pay | Admitting: *Deleted

## 2019-09-29 NOTE — Telephone Encounter (Signed)
Spoke with wife who stated they had received the 30 Day Event monitor yesterday 10/6 and he has put it on. I spoke w/[employer] who let me know the end date of the monitor is 11/5 and informed wife of this date.

## 2019-09-30 ENCOUNTER — Other Ambulatory Visit: Payer: Self-pay

## 2019-09-30 ENCOUNTER — Encounter: Payer: Self-pay | Admitting: Family Medicine

## 2019-09-30 ENCOUNTER — Ambulatory Visit (INDEPENDENT_AMBULATORY_CARE_PROVIDER_SITE_OTHER): Payer: Medicare HMO

## 2019-09-30 DIAGNOSIS — Z23 Encounter for immunization: Secondary | ICD-10-CM | POA: Diagnosis not present

## 2019-10-01 NOTE — Telephone Encounter (Signed)
Pt has monitor

## 2019-10-09 DIAGNOSIS — M25662 Stiffness of left knee, not elsewhere classified: Secondary | ICD-10-CM | POA: Diagnosis not present

## 2019-10-09 DIAGNOSIS — M1712 Unilateral primary osteoarthritis, left knee: Secondary | ICD-10-CM | POA: Diagnosis not present

## 2019-10-25 ENCOUNTER — Other Ambulatory Visit: Payer: Self-pay

## 2019-10-25 ENCOUNTER — Ambulatory Visit (INDEPENDENT_AMBULATORY_CARE_PROVIDER_SITE_OTHER): Payer: Medicare HMO

## 2019-10-25 VITALS — BP 124/64 | Temp 98.1°F | Ht 72.0 in | Wt 193.0 lb

## 2019-10-25 DIAGNOSIS — Z0000 Encounter for general adult medical examination without abnormal findings: Secondary | ICD-10-CM | POA: Diagnosis not present

## 2019-10-25 NOTE — Progress Notes (Signed)
I have reviewed and agree with note, evaluation, plan.  I appreciate the medication assistance forms that were given-hopefully that will be beneficial  Garret Reddish, MD

## 2019-10-25 NOTE — Progress Notes (Signed)
Subjective:   Wesley Fuller is a 78 y.o. male who presents for Medicare Annual/Subsequent preventive examination.  Review of Systems:   Cardiac Risk Factors include: advanced age (>67men, >81 women);male gender;hypertension     Objective:    Vitals: BP 124/64   Temp 98.1 F (36.7 C) (Temporal)   Ht 6' (1.829 m)   Wt 193 lb (87.5 kg)   BMI 26.18 kg/m   Body mass index is 26.18 kg/m.  Advanced Directives 10/25/2019 06/07/2019 06/02/2019 04/22/2019 05/07/2018 04/15/2017 02/22/2015  Does Patient Have a Medical Advance Directive? Yes Yes Yes Yes Yes Yes Yes  Type of Paramedic of [place];Living will [place];Living will [place];Living will - - - [place];Living will  Does patient want to make changes to medical advance directive? No - Patient declined No - Patient declined No - Patient declined - - - -  Copy of [place] in Chart? No - copy requested No - copy requested No - copy requested - - - No - copy requested    Tobacco Social History   Tobacco Use  Smoking Status Never Smoker  Smokeless Tobacco Never Used     Counseling given: Not Answered   Clinical Intake:  Pre-visit preparation completed: Yes  Pain : No/denies pain  How often do you need to have someone help you when you read instructions, pamphlets, or other written materials from your doctor or pharmacy?: 2 - Rarely  Interpreter Needed?: No  Comments: accompanied by spouse Information entered by :: Denman George LPN  Past Medical History:  Diagnosis Date  . Allergy    seasonal  . Asthma    as a child  . Calcium oxalate renal stones   . Cataract 2013  . Diverticulosis   . Dysrhythmia    a fib  . ED (erectile dysfunction)   . GERD (gastroesophageal reflux disease)   . History of kidney stones   . Hypertension   . Melanoma of back ([place]) 1974  . Nail fungal infection   . Osteoarthritis of left  knee 03/02/2014   Past Surgical History:  Procedure Laterality Date  . EYE SURGERY     cataract, glaucoma surgery  . HERNIA REPAIR     x 2  . left leg stripping     varicose vein  . lymph node disection  1981  . melanoma removal of back   1974  . NASAL POLYP SURGERY    . skin cancer    . TONSILLECTOMY    . TOTAL KNEE ARTHROPLASTY Left 06/07/2019   Procedure: TOTAL KNEE ARTHROPLASTY;  Surgeon: Gaynelle Arabian, MD;  Location: [facility];  Service: Orthopedics;  Laterality: Left;  38min   Family History  Problem Relation Age of Onset  . Colon cancer Father   . Colon cancer Paternal Uncle   . Colon cancer Paternal Uncle   . Hypertension Other    Social History   Socioeconomic History  . Marital status: Married    Spouse name: Not on file  . Number of children: 2  . Years of education: Not on file  . Highest education level: Not on file  Occupational History  . Occupation: Retired  Scientific laboratory technician  . Financial resource strain: Not on file  . Food insecurity    Worry: Not on file    Inability: Not on file  . Transportation needs    Medical: Not on file    Non-medical:  Not on file  Tobacco Use  . Smoking status: Never Smoker  . Smokeless tobacco: Never Used  Substance and Sexual Activity  . Alcohol use: Yes    Alcohol/week: 5.0 standard drinks    Types: 5 Glasses of wine per week    Comment: does not drink very much; glass of wine every day  . Drug use: No  . Sexual activity: Yes    Partners: Female  Lifestyle  . Physical activity    Days per week: Not on file    Minutes per session: Not on file  . Stress: Not on file  Relationships  . Social Herbalist on phone: Not on file    Gets together: Not on file    Attends religious service: Not on file    Active member of club or organization: Not on file    Attends meetings of clubs or organizations: Not on file    Relationship status: Not on file  Other Topics Concern  . Not on file  Social History Narrative    Married ([name]) 66. 2 sons- 1 in [place]. 3 grandsons.       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 amLODipine (NORVASC) 5 MG tablet TAKE ONE TABLET BY MOUTH ONE TIME DAILY AS DIRECTED  . Astaxanthin 4 MG CAPS Take 4 mg by mouth at bedtime.   Marland Kitchen CALCIUM PO Take 1 tablet by mouth daily.  Marland Kitchen donepezil (ARICEPT) 10 MG tablet Take 1 tablet (10 mg total) by mouth at bedtime.  . hydrochlorothiazide (HYDRODIURIL) 12.5 MG tablet TAKE ONE TABLET BY MOUTH ONE TIME DAILY  (Patient taking differently: Take 12.5 mg by mouth daily. )  . irbesartan (AVAPRO) 300 MG tablet TAKE ONE TABLET BY MOUTH ONE TIME DAILY   . Melatonin 5 MG TABS Take 5 mg by mouth at bedtime.  . mirabegron ER (MYRBETRIQ) 25 MG TB24 tablet Take 25 mg by mouth daily.  . rivaroxaban (XARELTO) 20 MG TABS tablet Take 1 tablet (20 mg total) by mouth daily with supper.  . tamsulosin (FLOMAX) 0.4 MG CAPS capsule Take 1 capsule (0.4 mg total) by mouth daily.  . TURMERIC PO Take 1,200 mg by mouth daily.   No facility-administered encounter medications on file as of 10/25/2019.     Activities of Daily Living In your present state of health, do you have any difficulty performing the following activities: 10/25/2019 06/07/2019  Hearing? Tempie Donning  Vision? N N  Difficulty concentrating or making decisions? Tempie Donning  Walking or climbing stairs? Y Y  Dressing or bathing? N N  Doing errands, shopping? Y N  Preparing Food and eating ? N -  Using the Toilet? N -  In the past six months, have you accidently leaked urine? N -  Do you have problems with loss of bowel control? N -  Managing your Medications? Y -  Managing your Finances? Y -  Housekeeping or managing your Housekeeping? Y -  Some recent data might be hidden    Patient Care Team: Marin Olp, MD as PCP - General (Family  Medicine) Luberta Mutter, MD as Consulting Physician (Ophthalmology) [name], Reita Cliche, MD as Consulting Physician (Cardiology) Allyn Kenner, MD as Consulting Physician (Dermatology) Cameron Sprang, MD as Consulting Physician (Neurology) Gaynelle Arabian, MD as Consulting Physician (Orthopedic Surgery)  Assessment:   This is a routine wellness examination for Wesley Fuller.  Exercise Activities and Dietary recommendations Current Exercise Habits: Structured exercise class, Type of exercise: walking, Time (Minutes): 45, Frequency (Times/Week): 3, Weekly Exercise (Minutes/Week): 135, Intensity: Mild  Goals    . Exercise 150 minutes per week (moderate activity)     Keep exercising more time The problem is overcoming schedule limitations with playing golf  Plays with the same group x 25 years and will work out the other exercise  Can go to the gym on Monday's and ad lib; do what you want to do .     Marland Kitchen Patient Stated     Would like to collect information regarding long term plans  [email or web address]  [employer]; [phone] Management consultant; Information regarding Long Term Care        Fall Risk Fall Risk  10/25/2019 04/22/2019 04/22/2019 08/31/2018 05/07/2018  Falls in the past year? 0 1 0 No No  Number falls in past yr: - 1 0 - -  Injury with Fall? 0 0 0 - -  Risk for fall due to : Impaired balance/gait;Mental status change - - - -  Follow up Education provided;Falls evaluation completed;Falls prevention discussed - - - -   Is the patient's home free of loose throw rugs in walkways, pet beds, electrical cords, etc?   yes      Grab bars in the bathroom? yes      Handrails on the stairs?   yes      Adequate lighting?   yes  Timed Get Up and Go Performed: completed and within normal timeframe; no gait abnormalities noted   Depression Screen PHQ 2/9 Scores 10/25/2019 05/25/2019 02/22/2019 01/06/2019  PHQ - 2 Score 0 2 0 1  PHQ- 9 Score - 5 2 -     Cognitive Function- followed by neurology last [place] within last 6 months no significant decline since last evaluation but patient is no longer part of clinical trial  MMSE - Mini Mental State Exam 05/07/2018 04/15/2017  Not completed: (No Data) -  Orientation to time 5 5  Orientation to Place 5 5  Registration 3 3  Attention/ Calculation 5 4  Recall 2 3  Language- name 2 objects 2 2  Language- repeat 1 1  Language- follow 3 step command 3 3  Language- read & follow direction 1 1  Write a sentence 1 1  Copy design 1 1  Total score 29 29   Montreal Cognitive Assessment  08/19/2019 04/23/2019 08/31/2018 11/29/2015  Visuospatial/ Executive (0/5) 5 4 3 5   Naming (0/3) 3 3 3 3   Attention: Read list of digits (0/2) 2 2 1 2   Attention: Read list of letters (0/1) 1 1 1 1   Attention: Serial 7 subtraction starting at 100 (0/3) 3 3 2 3   Language: Repeat phrase (0/2) 0 2 2 2   Language : Fluency (0/1) 0 1 1 1   Abstraction (0/2) 1 2 2 2   Delayed Recall (0/5) 1 2 2 2   Orientation (0/6) 5 6 5 6   Total 21 26 22 27   Adjusted Score (based on education) - - - 27   6CIT Screen 04/15/2017  What Year? 0 points  What month? 0 points  What time? 0 points  Count back from 20 4 points  Months in reverse 2 points  Repeat phrase 2 points  Total Score 8    Immunization History  Administered Date(s) Administered  . Fluad Quad(high Dose 65+) 09/30/2019  .  Hepatitis A 08/11/2007  . Influenza Split 09/08/2012  . Influenza Whole 09/26/2008, 10/04/2009, 09/04/2010, 09/03/2011  . Influenza, High Dose Seasonal PF 10/06/2013, 11/08/2014, 10/15/2016, 09/11/2017, 07/23/2018, 09/22/2018  . Influenza,inj,Quad PF,6+ Mos 08/22/2015  . Pneumococcal Conjugate-13 12/08/2014  . Pneumococcal Polysaccharide-23 01/28/2006  . Td 01/28/2006  . Tdap 10/16/2016  . Zoster 12/08/2014  . Zoster Recombinat (Shingrix) 05/07/2017, 08/01/2017    Qualifies for Shingles Vaccine? Shingrix completed   Screening Tests Health  Maintenance  Topic Date Due  . COLONOSCOPY  02/10/2019  . TETANUS/TDAP  10/16/2026  . INFLUENZA VACCINE  Completed  . PNA vac Low Risk Adult  Completed   Cancer Screenings: Lung: Low Dose CT Chest recommended if Age 76-80 years, 30 pack-year currently smoking OR have quit w/in 15years. Patient does not qualify. Colorectal: colonoscopy 02/10/14; repeat 5 years (patient would like to hold at this time)      Plan:  I have personally reviewed and addressed the Medicare Annual Wellness questionnaire and have noted the following in the patient's chart:  A. Medical and social history B. Use of alcohol, tobacco or illicit drugs  C. Current medications and supplements D. Functional ability and status E.  Nutritional status F.  Physical activity G. Advance directives H. List of other physicians I.  Hospitalizations, surgeries, and ER visits in previous 12 months J.  [place] such as hearing and vision if needed, cognitive and depression L. Referrals, records requested, and appointments- none   In addition, I have reviewed and discussed with patient certain preventive protocols, quality metrics, and best practice recommendations. A written personalized care plan for preventive services as well as general preventive health recommendations were provided to patient.   Signed,  Denman George, LPN  Nurse Health Advisor   Nurse Notes: Patient with increased costs for Myrbetriq and Xarelto, he is currently in the "donut hole" with these medications.  Patient assistance forms through manufacturer provided to patient and spouse.

## 2019-10-25 NOTE — Patient Instructions (Addendum)
Wesley Fuller , Thank you for taking time to come for your Medicare Wellness Visit. I appreciate your ongoing commitment to your health goals. Please review the following plan we discussed and let me know if I can assist you in the future.   Screening recommendations/referrals: Colorectal Screening: up to date; colonoscopy last 02/10/14  Vision and Dental Exams: Recommended annual ophthalmology exams for early detection of glaucoma and other disorders of the eye Recommended annual dental exams for proper oral hygiene  Vaccinations: Influenza vaccine: completed 09/30/19 Pneumococcal vaccine: up to date; last 12/08/14 Tdap vaccine: up to date; last 10/16/16 Shingles vaccine:Shingrix completed   Advanced directives: Please bring a copy of your POA (Power of [place]) and/or Living Will to your next appointment.  Goals: Recommend to drink at least 6-8 8oz glasses of water per day and consume a balanced diet rich in fresh fruits and vegetables.   Next appointment: Please schedule your Annual Wellness Visit with your Nurse Health Advisor in one year.  Preventive Care 16 Years and Older, Male Preventive care refers to lifestyle choices and visits with your health care provider that can promote health and wellness. What does preventive care include?  A yearly physical exam. This is also called an annual well check.  Dental exams once or twice a year.  Routine eye exams. Ask your health care provider how often you should have your eyes checked.  Personal lifestyle choices, including:  Daily care of your teeth and gums.  Regular physical activity.  Eating a healthy diet.  Avoiding tobacco and drug use.  Limiting alcohol use.  Practicing safe sex.  Taking low doses of aspirin every day if recommended by your health care provider..  Taking vitamin and mineral supplements as recommended by your health care provider. What happens during an annual well check? The services and screenings  done by your health care provider during your annual well check will depend on your age, overall health, lifestyle risk factors, and family history of disease. Counseling  Your health care provider may ask you questions about your:  Alcohol use.  Tobacco use.  Drug use.  Emotional well-being.  Home and relationship well-being.  Sexual activity.  Eating habits.  History of falls.  Memory and ability to understand (cognition).  Work and work Statistician. Screening  You may have the following tests or measurements:  Height, weight, and BMI.  Blood pressure.  Lipid and cholesterol levels. These may be checked every 5 years, or more frequently if you are over 69 years old.  Skin check.  Lung cancer screening. You may have this screening every year starting at age 21 if you have a 30-pack-year history of smoking and currently smoke or have quit within the past 15 years.  Fecal occult blood test (FOBT) of the stool. You may have this test every year starting at age 50.  Flexible sigmoidoscopy or colonoscopy. You may have a sigmoidoscopy every 5 years or a colonoscopy every 10 years starting at age 62.  Prostate cancer screening. Recommendations will vary depending on your family history and other risks.  Hepatitis C blood test.  Hepatitis B blood test.  Sexually transmitted disease (STD) testing.  Diabetes screening. This is done by checking your blood sugar (glucose) after you have not eaten for a while (fasting). You may have this done every 1-3 years.  Abdominal aortic aneurysm (AAA) screening. You may need this if you are a current or former smoker.  Osteoporosis. You may be screened starting at age  70 if you are at high risk. Talk with your health care provider about your test results, treatment options, and if necessary, the need for more tests. Vaccines  Your health care provider may recommend certain vaccines, such as:  Influenza vaccine. This is recommended  every year.  Tetanus, diphtheria, and acellular pertussis (Tdap, Td) vaccine. You may need a Td booster every 10 years.  Zoster vaccine. You may need this after age 51.  Pneumococcal 13-valent conjugate (PCV13) vaccine. One dose is recommended after age 80.  Pneumococcal polysaccharide (PPSV23) vaccine. One dose is recommended after age 46. Talk to your health care provider about which screenings and vaccines you need and how often you need them. This information is not intended to replace advice given to you by your health care provider. Make sure you discuss any questions you have with your health care provider. Document Released: 01/05/2016 Document Revised: 08/28/2016 Document Reviewed: 10/10/2015 Elsevier Interactive Patient Education  2017 [place] Prevention in the Home Falls can cause injuries. They can happen to people of all ages. There are many things you can do to make your home safe and to help prevent falls. What can I do on the outside of my home?  Regularly fix the edges of walkways and driveways and fix any cracks.  Remove anything that might make you trip as you walk through a door, such as a raised step or threshold.  Trim any bushes or trees on the path to your home.  Use bright outdoor lighting.  Clear any walking paths of anything that might make someone trip, such as rocks or tools.  Regularly check to see if handrails are loose or broken. Make sure that both sides of any steps have handrails.  Any raised decks and porches should have guardrails on the edges.  Have any leaves, snow, or ice cleared regularly.  Use sand or salt on walking paths during winter.  Clean up any spills in your garage right away. This includes oil or grease spills. What can I do in the bathroom?  Use night lights.  Install grab bars by the toilet and in the tub and shower. Do not use towel bars as grab bars.  Use non-skid mats or decals in the tub or shower.  If  you need to sit down in the shower, use a plastic, non-slip stool.  Keep the floor dry. Clean up any water that spills on the floor as soon as it happens.  Remove soap buildup in the tub or shower regularly.  Attach bath mats securely with double-sided non-slip rug tape.  Do not have throw rugs and other things on the floor that can make you trip. What can I do in the bedroom?  Use night lights.  Make sure that you have a light by your bed that is easy to reach.  Do not use any sheets or blankets that are too big for your bed. They should not hang down onto the floor.  Have a firm chair that has side arms. You can use this for support while you get dressed.  Do not have throw rugs and other things on the floor that can make you trip. What can I do in the kitchen?  Clean up any spills right away.  Avoid walking on wet floors.  Keep items that you use a lot in easy-to-reach places.  If you need to reach something above you, use a strong step stool that has a grab bar.  Keep  electrical cords out of the way.  Do not use floor polish or wax that makes floors slippery. If you must use wax, use non-skid floor wax.  Do not have throw rugs and other things on the floor that can make you trip. What can I do with my stairs?  Do not leave any items on the stairs.  Make sure that there are handrails on both sides of the stairs and use them. Fix handrails that are broken or loose. Make sure that handrails are as long as the stairways.  Check any carpeting to make sure that it is firmly attached to the stairs. Fix any carpet that is loose or worn.  Avoid having throw rugs at the top or bottom of the stairs. If you do have throw rugs, attach them to the floor with carpet tape.  Make sure that you have a light switch at the top of the stairs and the bottom of the stairs. If you do not have them, ask someone to add them for you. What else can I do to help prevent falls?  Wear shoes  that:  Do not have high heels.  Have rubber bottoms.  Are comfortable and fit you well.  Are closed at the toe. Do not wear sandals.  If you use a stepladder:  Make sure that it is fully opened. Do not climb a closed stepladder.  Make sure that both sides of the stepladder are locked into place.  Ask someone to hold it for you, if possible.  Clearly mark and make sure that you can see:  Any grab bars or handrails.  First and last steps.  Where the edge of each step is.  Use tools that help you move around (mobility aids) if they are needed. These include:  Canes.  Walkers.  Scooters.  Crutches.  Turn on the lights when you go into a dark area. Replace any light bulbs as soon as they burn out.  Set up your furniture so you have a clear path. Avoid moving your furniture around.  If any of your floors are uneven, fix them.  If there are any pets around you, be aware of where they are.  Review your medicines with your doctor. Some medicines can make you feel dizzy. This can increase your chance of falling. Ask your doctor what other things that you can do to help prevent falls. This information is not intended to replace advice given to you by your health care provider. Make sure you discuss any questions you have with your health care provider. Document Released: 10/05/2009 Document Revised: 05/16/2016 Document Reviewed: 01/13/2015 Elsevier Interactive Patient Education  2017 [employer].

## 2019-11-03 ENCOUNTER — Telehealth: Payer: Self-pay

## 2019-11-03 NOTE — Telephone Encounter (Signed)
Results relayed, patient scheduled for a f/u appt next week with Dr. Docia Furl to discuss Xarelto. Copy of results sent to Dr. Yong Channel.

## 2019-11-03 NOTE — Telephone Encounter (Signed)
-----   Message from Rajan R [name], MD sent at 11/02/2019  4:52 PM EST ----- The results of the study is unremarkable. Please inform patient. I will discuss in detail at next appointment. Cc  primary care/referring physician Rajan R [name], MD 11/02/2019 4:52 PM 

## 2019-11-08 ENCOUNTER — Ambulatory Visit (INDEPENDENT_AMBULATORY_CARE_PROVIDER_SITE_OTHER): Payer: Medicare HMO | Admitting: Cardiology

## 2019-11-08 ENCOUNTER — Encounter: Payer: Self-pay | Admitting: Cardiology

## 2019-11-08 ENCOUNTER — Other Ambulatory Visit: Payer: Self-pay

## 2019-11-08 VITALS — BP 114/58 | HR 77 | Wt 192.0 lb

## 2019-11-08 DIAGNOSIS — I10 Essential (primary) hypertension: Secondary | ICD-10-CM | POA: Diagnosis not present

## 2019-11-08 DIAGNOSIS — I480 Paroxysmal atrial fibrillation: Secondary | ICD-10-CM | POA: Diagnosis not present

## 2019-11-08 NOTE — Patient Instructions (Addendum)
Medication Instructions:  Your physician has recommended you make the following change in your medication:   STOP:  HYDROCHLORATHIAZIDE   *If you need a refill on your cardiac medications before your next appointment, please call your pharmacy*  Lab Work: NONE If you have labs (blood work) drawn today and your tests are completely normal, you will receive your results only by: Marland Kitchen MyChart Message (if you have MyChart) OR . A paper copy in the mail If you have any lab test that is abnormal or we need to change your treatment, we will call you to review the results.  Testing/Procedures: NONE  Follow-Up: At [facility], you and your health needs are our priority.  As part of our continuing mission to provide you with exceptional heart care, we have created designated Provider Care Teams.  These Care Teams include your primary Cardiologist (physician) and Advanced Practice Providers (APPs -  Physician Assistants and Nurse Practitioners) who all work together to provide you with the care you need, when you need it.  Your next appointment:   3 months  The format for your next appointment:   In Person  Provider:   Jyl Heinz, MD

## 2019-11-08 NOTE — Progress Notes (Signed)
Cardiology Office Note:    Date:  11/08/2019   ID:  Wesley Fuller, DOB 02-27-1941, MRN [id number]  PCP:  Wesley Olp, MD  Cardiologist:  Wesley Lindau, MD   Referring MD: Wesley Olp, MD    ASSESSMENT:    1. Paroxysmal atrial fibrillation (HCC)   2. Essential hypertension    PLAN:    In order of problems listed above:  1. Paroxysmal atrial fibrillation: The patient has a CHADS2 score of 2.  He is a candidate for anticoagulation.  The wife and the patient very keen on the possibility of withholding anticoagulation for 2 years as the preferred for him to be in a study for dementia.  This medicine that is used in this study cannot be used in patients on anticoagulation and therefore the requested medication to be withheld.  Benefits and risks of anticoagulation were discussed with the patient.  I will reach out to my electrophysiology colleagues to review his chart to see if I could reduce the risks of paroxysmal atrial fibrillation by empirically putting the patient on amiodarone 100 or 200 mg daily and monitor his pulmonary and liver function.  I do not have much experience in this and therefore I would like to reach out to them.  An alternative to consider is something like a watchman device however I am not sure whether he will be able to get regular MRI and PET scans for his brain in the presence of a watchman device and this further challenges this pathway of management.  I will discuss this with my electrophysiology colleagues and get back to the patient. 2. Essential hypertension: Blood pressure is borderline in the prefer to withhold hydrochlorothiazide and I agree with them. 3. Patient will be seen in follow-up appointment in 3 months or earlier if the patient has any concerns    Medication Adjustments/Labs and Tests Ordered: Current medicines are reviewed at length with the patient today.  Concerns regarding medicines are outlined above.  No orders of the defined  types were placed in this encounter.  No orders of the defined types were placed in this encounter.    No chief complaint on file.    History of Present Illness:    Wesley Fuller is a 78 y.o. male.  Patient has past medical history of essential hypertension and paroxysmal atrial fibrillation.  He has issues with dementia and mentions to me that he is in a trial by neurology.  This necessitates that his anticoagulation be withheld the patient and the wife have questions about the possibility of withholding anticoagulation.  Past Medical History:  Diagnosis Date  . Allergy    seasonal  . Asthma    as a child  . Calcium oxalate renal stones   . Cataract 2013  . Diverticulosis   . Dysrhythmia    a fib  . ED (erectile dysfunction)   . GERD (gastroesophageal reflux disease)   . History of kidney stones   . Hypertension   . Melanoma of back ([place]) 1974  . Nail fungal infection   . Osteoarthritis of left knee 03/02/2014    Past Surgical History:  Procedure Laterality Date  . EYE SURGERY     cataract, glaucoma surgery  . HERNIA REPAIR     x 2  . left leg stripping     varicose vein  . lymph node disection  1981  . melanoma removal of back   1974  . NASAL POLYP SURGERY    .  skin cancer    . TONSILLECTOMY    . TOTAL KNEE ARTHROPLASTY Left 06/07/2019   Procedure: TOTAL KNEE ARTHROPLASTY;  Surgeon: Gaynelle Arabian, MD;  Location: [facility];  Service: Orthopedics;  Laterality: Left;  71min  . TOTAL KNEE ARTHROPLASTY Left     Current Medications: Current Meds  Medication Sig  . amLODipine (NORVASC) 5 MG tablet TAKE ONE TABLET BY MOUTH ONE TIME DAILY AS DIRECTED  . Astaxanthin 4 MG CAPS Take 4 mg by mouth at bedtime.   Marland Kitchen CALCIUM PO Take 1 tablet by mouth daily.  . cholecalciferol (VITAMIN D3) 25 MCG (1000 UT) tablet Take 1,000 Units by mouth daily.  Marland Kitchen donepezil (ARICEPT) 10 MG tablet Take 1 tablet (10 mg total) by mouth at bedtime.  . irbesartan (AVAPRO) 300 MG tablet TAKE ONE  TABLET BY MOUTH ONE TIME DAILY   . Melatonin 5 MG TABS Take 5 mg by mouth at bedtime.  . mirabegron ER (MYRBETRIQ) 25 MG TB24 tablet Take 25 mg by mouth daily.  . rivaroxaban (XARELTO) 20 MG TABS tablet Take 1 tablet (20 mg total) by mouth daily with supper.  . tamsulosin (FLOMAX) 0.4 MG CAPS capsule Take 1 capsule (0.4 mg total) by mouth daily.  . TURMERIC PO Take 1,200 mg by mouth daily.  . vitamin B-12 (CYANOCOBALAMIN) 500 MCG tablet Take 1,000 mcg by mouth daily.  . [DISCONTINUED] hydrochlorothiazide (HYDRODIURIL) 12.5 MG tablet TAKE ONE TABLET BY MOUTH ONE TIME DAILY  (Patient taking differently: Take 12.5 mg by mouth daily. )     Allergies:   Patient has no known allergies.   Social History   Socioeconomic History  . Marital status: Married    Spouse name: Not on file  . Number of children: 2  . Years of education: Not on file  . Highest education level: Not on file  Occupational History  . Occupation: Retired  Scientific laboratory technician  . Financial resource strain: Not on file  . Food insecurity    Worry: Not on file    Inability: Not on file  . Transportation needs    Medical: Not on file    Non-medical: Not on file  Tobacco Use  . Smoking status: Never Smoker  . Smokeless tobacco: Never Used  Substance and Sexual Activity  . Alcohol use: Yes    Alcohol/week: 5.0 standard drinks    Types: 5 Glasses of wine per week    Comment: does not drink very much; glass of wine every day  . Drug use: No  . Sexual activity: Yes    Partners: Female  Lifestyle  . Physical activity    Days per week: Not on file    Minutes per session: Not on file  . Stress: Not on file  Relationships  . Social Herbalist on phone: Not on file    Gets together: Not on file    Attends religious service: Not on file    Active member of club or organization: Not on file    Attends meetings of clubs or organizations: Not on file    Relationship status: Not on file  Other Topics Concern  . Not  on file  Social History Narrative   Married ([name]) 29. 2 sons- 1 in [place]. 3 grandsons.       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  ROS:   Please see the history of present illness.    All other systems reviewed and are negative.  EKGs/Labs/Other Studies Reviewed:    The following studies were reviewed today: Results of the monitoring were discussed with the patient and they were unremarkable.   Recent Labs: 06/02/2019: ALT 26 06/08/2019: BUN 15; Creatinine, Ser 0.79; Hemoglobin 15.1; Platelets 159; Potassium 4.0; Sodium 141  Recent Lipid Panel    Component Value Date/Time   CHOL 159 10/09/2017 0807   TRIG 105.0 10/09/2017 0807   HDL 52.30 10/09/2017 0807   CHOLHDL 3 10/09/2017 0807   VLDL 21.0 10/09/2017 0807   LDLCALC 86 10/09/2017 0807    Physical Exam:    VS:  BP (!) 114/58 (Patient Position: Sitting, Cuff Size: Normal)   Pulse 77   Wt 192 lb (87.1 kg)   SpO2 98%   BMI 26.04 kg/m     Wt Readings from Last 3 Encounters:  11/08/19 192 lb (87.1 kg)  10/25/19 193 lb (87.5 kg)  06/07/19 190 lb 6.4 oz (86.4 kg)     GEN: Patient is in no acute distress HEENT: Normal NECK: No JVD; No carotid bruits LYMPHATICS: No lymphadenopathy CARDIAC: Hear sounds regular, 2/6 systolic murmur at the apex. RESPIRATORY:  Clear to auscultation without rales, wheezing or rhonchi  ABDOMEN: Soft, non-tender, non-distended MUSCULOSKELETAL:  No edema; No deformity  SKIN: Warm and dry NEUROLOGIC:  Alert and oriented x 3 PSYCHIATRIC:  Normal affect   Signed, Wesley Lindau, MD  11/08/2019 4:17 PM    [facility]

## 2019-11-10 ENCOUNTER — Telehealth: Payer: Self-pay

## 2019-11-10 NOTE — Telephone Encounter (Signed)
Left message on home and cell phone to call office and schedule appt to discuss med changes with Dr. Docia Furl this week.

## 2019-11-12 ENCOUNTER — Ambulatory Visit (INDEPENDENT_AMBULATORY_CARE_PROVIDER_SITE_OTHER): Payer: Medicare HMO | Admitting: Cardiology

## 2019-11-12 ENCOUNTER — Encounter: Payer: Self-pay | Admitting: Cardiology

## 2019-11-12 ENCOUNTER — Other Ambulatory Visit: Payer: Self-pay

## 2019-11-12 VITALS — BP 104/62 | HR 66 | Ht 72.0 in | Wt 194.8 lb

## 2019-11-12 DIAGNOSIS — R079 Chest pain, unspecified: Secondary | ICD-10-CM

## 2019-11-12 DIAGNOSIS — I480 Paroxysmal atrial fibrillation: Secondary | ICD-10-CM

## 2019-11-12 DIAGNOSIS — I10 Essential (primary) hypertension: Secondary | ICD-10-CM

## 2019-11-12 MED ORDER — METOPROLOL TARTRATE 50 MG PO TABS: 100.0000 mg | ORAL_TABLET | Freq: Once | ORAL | 0 refills | Status: DC

## 2019-11-12 MED ORDER — AMLODIPINE BESYLATE 5 MG PO TABS: 2.5000 mg | ORAL_TABLET | Freq: Every day | ORAL | 0 refills | Status: DC

## 2019-11-12 NOTE — Patient Instructions (Addendum)
Medication Instructions:  Your physician has recommended you make the following change in your medication:  DECREASE amlodipine to 2.5 mg (0.5 tablet) once daily If you need a refill on your cardiac medications before your next appointment, please call your pharmacy.   Lab work: You will need to come in 7 days prior to procedure to have a BMP drawn.  If you have labs (blood work) drawn today and your tests are completely normal, you will receive your results only by:  [place] (if you have MyChart) OR  A paper copy in the mail If you have any lab test that is abnormal or we need to change your treatment, we will call you to review the results.  Testing/Procedures: Non-Cardiac CT scanning, (CAT scanning), is a noninvasive, special x-ray that produces cross-sectional images of the body using x-rays and a computer. CT scans help physicians diagnose and treat medical conditions. For some CT exams, a contrast material is used to enhance visibility in the area of the body being studied. CT scans provide greater clarity and reveal more details than regular x-ray exams.  Please arrive at the [facility] main entrance of [facility] at xx:xx AM (30-45 minutes prior to test start time)  [facility] [place], [place] [address] [phone]  Proceed to the [facility] Radiology Department (First Floor).  Please follow these instructions carefully (unless otherwise directed):  Hold all erectile dysfunction medications at least 48 hours prior to test.  On the Night Before the Test:  Be sure to Drink plenty of water.  Do not consume any caffeinated/decaffeinated beverages or chocolate 12 hours prior to your test.  Do not take any antihistamines 12 hours prior to your test.  On the Day of the Test:  Drink plenty of water. Do not drink any water within one hour of the test.  Do not eat any food 4 hours prior to the test.  You may take your  regular medications prior to the test.   Take metoprolol (Lopressor) two hours prior to test.     After the Test:  Drink plenty of water.  After receiving IV contrast, you may experience a mild flushed feeling. This is normal.  On occasion, you may experience a mild rash up to 24 hours after the test. This is not dangerous. If this occurs, you can take Benadryl 25 mg and increase your fluid intake.  If you experience trouble breathing, this can be serious. If it is severe call 911 IMMEDIATELY. If it is mild, please call our office.   Follow-Up: At [facility], you and your health needs are our priority.  As part of our continuing mission to provide you with exceptional heart care, we have created designated Provider Care Teams.  These Care Teams include your primary Cardiologist (physician) and Advanced Practice Providers (APPs -  Physician Assistants and Nurse Practitioners) who all work together to provide you with the care you need, when you need it. You will need a follow up appointment in 1 months.    Any Other Special Instructions Will Be Listed Below  Metoprolol tablets What is this medicine? METOPROLOL (me TOE proe lole) is a beta-blocker. Beta-blockers reduce the workload on the heart and help it to beat more regularly. This medicine is used to treat high blood pressure and to prevent chest pain. It is also used to after a heart attack and to prevent an additional heart attack from occurring. This medicine may be  used for other purposes; ask your health care provider or pharmacist if you have questions. COMMON BRAND NAME(S): Lopressor What should I tell my health care provider before I take this medicine? They need to know if you have any of these conditions: -diabetes -heart or vessel disease like slow heart rate, worsening heart failure, heart block, sick sinus syndrome or Raynaud's disease -kidney disease -liver disease -lung or breathing disease, like asthma or  emphysema -pheochromocytoma -thyroid disease -an unusual or allergic reaction to metoprolol, other beta-blockers, medicines, foods, dyes, or preservatives -pregnant or trying to get pregnant -breast-feeding How should I use this medicine? Take this medicine by mouth with a drink of water. Follow the directions on the prescription label. Take this medicine immediately after meals. Take your doses at regular intervals. Do not take more medicine than directed. Do not stop taking this medicine suddenly. This could lead to serious heart-related effects. Talk to your pediatrician regarding the use of this medicine in children. Special care may be needed. Overdosage: If you think you have taken too much of this medicine contact a poison control center or emergency room at once. NOTE: This medicine is only for you. Do not share this medicine with others. What if I miss a dose? If you miss a dose, take it as soon as you can. If it is almost time for your next dose, take only that dose. Do not take double or extra doses. What may interact with this medicine? This medicine may interact with the following medications: -certain medicines for blood pressure, heart disease, irregular heart beat -certain medicines for depression like monoamine oxidase (MAO) inhibitors, fluoxetine, or paroxetine -clonidine -dobutamine -epinephrine -isoproterenol -reserpine This list may not describe all possible interactions. Give your health care provider a list of all the medicines, herbs, non-prescription drugs, or dietary supplements you use. Also tell them if you smoke, drink alcohol, or use illegal drugs. Some items may interact with your medicine. What should I watch for while using this medicine? Visit your doctor or health care professional for regular check ups. Contact your doctor right away if your symptoms worsen. Check your blood pressure and pulse rate regularly. Ask your health care professional what your  blood pressure and pulse rate should be, and when you should contact them. You may get drowsy or dizzy. Do not drive, use machinery, or do anything that needs mental alertness until you know how this medicine affects you. Do not sit or stand up quickly, especially if you are an older patient. This reduces the risk of dizzy or fainting spells. Contact your doctor if these symptoms continue. Alcohol may interfere with the effect of this medicine. Avoid alcoholic drinks. What side effects may I notice from receiving this medicine? Side effects that you should report to your doctor or health care professional as soon as possible: -allergic reactions like skin rash, itching or hives -cold or numb hands or feet -depression -difficulty breathing -faint -fever with sore throat -irregular heartbeat, chest pain -rapid weight gain -swollen legs or ankles Side effects that usually do not require medical attention (report to your doctor or health care professional if they continue or are bothersome): -anxiety or nervousness -change in sex drive or performance -dry skin -headache -nightmares or trouble sleeping -short term memory loss -stomach upset or diarrhea -unusually tired This list may not describe all possible side effects. Call your doctor for medical advice about side effects. You may report side effects to FDA at 1-800-FDA-1088. Where should I keep  my medicine? Keep out of the reach of children. Store at room temperature between 15 and 30 degrees C (59 and 86 degrees F). Throw away any unused medicine after the expiration date. NOTE: This sheet is a summary. It may not cover all possible information. If you have questions about this medicine, talk to your doctor, pharmacist, or health care provider.  2019 Elsevier/Gold Standard (2013-08-13 14:40:36)   Coronary Angiogram A coronary angiogram is an X-ray procedure that is used to examine the arteries in the heart. In this procedure, a dye  (contrast dye) is injected through a long, thin tube (catheter). The catheter is inserted through the groin, wrist, or arm. The dye is injected into each artery, then X-rays are taken to show if there is a blockage in the arteries of the heart. This procedure can also show if you have valve disease or a disease of the aorta, and it can be used to check the overall function of your heart muscle. You may have a coronary angiogram if:  You are having chest pain, or other symptoms of angina, and you are at risk for heart disease.  You have an abnormal electrocardiogram (ECG) or stress test.  You have chest pain and heart failure.  You are having irregular heart rhythms.  You and your health care provider determine that the benefits of the test information outweigh the risks of the procedure. Let your health care provider know about:  Any allergies you have, including allergies to contrast dye.  All medicines you are taking, including vitamins, herbs, eye drops, creams, and over-the-counter medicines.  Any problems you or family members have had with anesthetic medicines.  Any blood disorders you have.  Any surgeries you have had.  History of kidney problems or kidney failure.  Any medical conditions you have.  Whether you are pregnant or may be pregnant. What are the risks? Generally, this is a safe procedure. However, problems may occur, including:  Infection.  Allergic reaction to medicines or dyes that are used.  Bleeding from the access site or other locations.  Kidney injury, especially in people with impaired kidney function.  Stroke (rare).  Heart attack (rare).  Damage to other structures or organs. What happens before the procedure? Staying hydrated Follow instructions from your health care provider about hydration, which may include:  Up to 2 hours before the procedure - you may continue to drink clear liquids, such as water, clear fruit juice, black coffee, and  plain tea. Eating and drinking restrictions Follow instructions from your health care provider about eating and drinking, which may include:  8 hours before the procedure - stop eating heavy meals or foods such as meat, fried foods, or fatty foods.  6 hours before the procedure - stop eating light meals or foods, such as toast or cereal.  2 hours before the procedure - stop drinking clear liquids. General instructions  Ask your health care provider about: ? Changing or stopping your regular medicines. This is especially important if you are taking diabetes medicines or blood thinners. ? Taking medicines such as ibuprofen. These medicines can thin your blood. Do not take these medicines before your procedure if your health care provider instructs you not to, though aspirin may be recommended prior to coronary angiograms.  Plan to have someone take you home from the hospital or clinic.  You may need to have blood tests or X-rays done. What happens during the procedure?  An IV tube will be inserted into one  of your veins.  You will be given one or more of the following: ? A medicine to help you relax (sedative). ? A medicine to numb the area where the catheter will be inserted into an artery (local anesthetic).  To reduce your risk of infection: ? Your health care team will wash or sanitize their hands. ? Your skin will be washed with soap. ? Hair may be removed from the area where the catheter will be inserted.  You will be connected to a continuous ECG monitor.  The catheter will be inserted into an artery. The location may be in your groin, in your wrist, or in the fold of your arm (near your elbow).  A type of X-ray (fluoroscopy) will be used to help guide the catheter to the opening of the blood vessel that is being examined.  A dye will be injected into the catheter, and X-rays will be taken. The dye will help to show where any narrowing or blockages are located in the heart  arteries.  Tell your health care provider if you have any chest pain or trouble breathing during the procedure.  If blockages are found, your health care provider may perform another procedure, such as inserting a coronary stent. The procedure may vary among health care providers and hospitals. What happens after the procedure?  After the procedure, you will need to keep the area still for a few hours, or for as long as told by your health care provider. If the procedure is done through the groin, you will be instructed to not bend and not cross your legs.  The insertion site will be checked frequently.  The pulse in your foot or wrist will be checked frequently.  You may have additional blood tests, X-rays, and a test that records the electrical activity of your heart (ECG).  Do not drive for 24 hours if you were given a sedative. Summary  A coronary angiogram is an X-ray procedure that is used to look into the arteries in the heart.  During the procedure, a dye (contrast dye) is injected through a long, thin tube (catheter). The catheter is inserted through the groin, wrist, or arm.  Tell your health care provider about any allergies you have, including allergies to contrast dye.  After the procedure, you will need to keep the area still for a few hours, or for as long as told by your health care provider. This information is not intended to replace advice given to you by your health care provider. Make sure you discuss any questions you have with your health care provider. Document Released: 06/15/2003 Document Revised: 09/20/2016 Document Reviewed: 09/20/2016 Elsevier Interactive Patient Education  2019 [employer].

## 2019-11-12 NOTE — Progress Notes (Signed)
Cardiology Office Note:    Date:  11/12/2019   ID:  Wesley Fuller, DOB April 14, 1941, MRN [id number]  PCP:  Marin Olp, MD  Cardiologist:  Jenean Lindau, MD   Referring MD: Marin Olp, MD    ASSESSMENT:    1. Essential hypertension   2. Paroxysmal atrial fibrillation (HCC)    PLAN:    In order of problems listed above:  1. Paroxysmal atrial fibrillation: Over the past several months patient has not realized any palpitations or any such issues.  He is happy about this.  He wants to go off anticoagulation because he plans to get into a study for his dementia which is rapidly deteriorating according to his wife.  The medication will help his dementia and he was using it in the past and the study and it was helping him.  Unfortunately he cannot be on anticoagulation with this medication and he wants to go off it.  I discussed this with my partner Dr. Bobby Rumpf and we came up with the following plan.  I will evaluate him with coronary CT scan to rule out obstructive coronary artery disease.  If this is negative we will initiate him on flecainide.  Hopefully this will give him his best chance of being in sinus rhythm we will drop his anticoagulation with attendant risks with the patient and the family predominantly his wife is willing to accept.  I had this discussion with them and they understand.  They are multiple questions which were answered to their satisfaction.  Further recommendations will be based on the findings of the coronary CTA angiography. 2. Essential hypertension: Blood pressure stable matter-of-fact is borderline.  I have asked him to cut down his amlodipine to 2.5 mg daily.  He will be seen in follow-up appointment in a month or earlier if he has any concerns.   Medication Adjustments/Labs and Tests Ordered: Current medicines are reviewed at length with the patient today.  Concerns regarding medicines are outlined above.  No orders of the defined types were  placed in this encounter.  No orders of the defined types were placed in this encounter.    No chief complaint on file.    History of Present Illness:    Wesley Fuller is a 78 y.o. male.  Patient has past medical history of essential hypertension and paroxysmal atrial fibrillation.  He was seen by me a couple of days ago for consideration getting off anticoagulation and had a long discussion with him.  He is here for follow-up this discussion.  At the time of my evaluation, the patient is alert awake oriented and in no distress.  He denies any history of palpitations or any such issues.  Past Medical History:  Diagnosis Date  . Allergy    seasonal  . Asthma    as a child  . Calcium oxalate renal stones   . Cataract 2013  . Diverticulosis   . Dysrhythmia    a fib  . ED (erectile dysfunction)   . GERD (gastroesophageal reflux disease)   . History of kidney stones   . Hypertension   . Melanoma of back ([place]) 1974  . Nail fungal infection   . Osteoarthritis of left knee 03/02/2014    Past Surgical History:  Procedure Laterality Date  . EYE SURGERY     cataract, glaucoma surgery  . HERNIA REPAIR     x 2  . left leg stripping     varicose vein  .  lymph node disection  1981  . melanoma removal of back   1974  . NASAL POLYP SURGERY    . skin cancer    . TONSILLECTOMY    . TOTAL KNEE ARTHROPLASTY Left 06/07/2019   Procedure: TOTAL KNEE ARTHROPLASTY;  Surgeon: Gaynelle Arabian, MD;  Location: [facility];  Service: Orthopedics;  Laterality: Left;  58min  . TOTAL KNEE ARTHROPLASTY Left     Current Medications: Current Meds  Medication Sig  . amLODipine (NORVASC) 5 MG tablet TAKE ONE TABLET BY MOUTH ONE TIME DAILY AS DIRECTED  . Astaxanthin 4 MG CAPS Take 4 mg by mouth at bedtime.   Marland Kitchen CALCIUM PO Take 1 tablet by mouth daily.  . cholecalciferol (VITAMIN D3) 25 MCG (1000 UT) tablet Take 1,000 Units by mouth daily.  Marland Kitchen donepezil (ARICEPT) 10 MG tablet Take 1 tablet (10 mg total) by  mouth at bedtime.  . irbesartan (AVAPRO) 300 MG tablet TAKE ONE TABLET BY MOUTH ONE TIME DAILY   . Melatonin 5 MG TABS Take 5 mg by mouth at bedtime.  . mirabegron ER (MYRBETRIQ) 25 MG TB24 tablet Take 25 mg by mouth daily.  . rivaroxaban (XARELTO) 20 MG TABS tablet Take 1 tablet (20 mg total) by mouth daily with supper.  . tamsulosin (FLOMAX) 0.4 MG CAPS capsule Take 1 capsule (0.4 mg total) by mouth daily.  . TURMERIC PO Take 1,200 mg by mouth daily.  . vitamin B-12 (CYANOCOBALAMIN) 500 MCG tablet Take 1,000 mcg by mouth daily.     Allergies:   Patient has no known allergies.   Social History   Socioeconomic History  . Marital status: Married    Spouse name: Not on file  . Number of children: 2  . Years of education: Not on file  . Highest education level: Not on file  Occupational History  . Occupation: Retired  Scientific laboratory technician  . Financial resource strain: Not on file  . Food insecurity    Worry: Not on file    Inability: Not on file  . Transportation needs    Medical: Not on file    Non-medical: Not on file  Tobacco Use  . Smoking status: Never Smoker  . Smokeless tobacco: Never Used  Substance and Sexual Activity  . Alcohol use: Yes    Alcohol/week: 5.0 standard drinks    Types: 5 Glasses of wine per week    Comment: does not drink very much; glass of wine every day  . Drug use: No  . Sexual activity: Yes    Partners: Female  Lifestyle  . Physical activity    Days per week: Not on file    Minutes per session: Not on file  . Stress: Not on file  Relationships  . Social Herbalist on phone: Not on file    Gets together: Not on file    Attends religious service: Not on file    Active member of club or organization: Not on file    Attends meetings of clubs or organizations: Not on file    Relationship status: Not on file  Other Topics Concern  . Not on file  Social History Narrative   Married ([name]) 28. 2 sons- 1 in [place]. 3 grandsons.        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  ROS:   Please see the history of present illness.    All other systems reviewed and are negative.  EKGs/Labs/Other Studies Reviewed:    The following studies were reviewed today: I reviewed records from previous visits   Recent Labs: 06/02/2019: ALT 26 06/08/2019: BUN 15; Creatinine, Ser 0.79; Hemoglobin 15.1; Platelets 159; Potassium 4.0; Sodium 141  Recent Lipid Panel    Component Value Date/Time   CHOL 159 10/09/2017 0807   TRIG 105.0 10/09/2017 0807   HDL 52.30 10/09/2017 0807   CHOLHDL 3 10/09/2017 0807   VLDL 21.0 10/09/2017 0807   LDLCALC 86 10/09/2017 0807    Physical Exam:    VS:  BP 104/62   Pulse 66   Ht 6' (1.829 m)   Wt 194 lb 12.8 oz (88.4 kg)   SpO2 96%   BMI 26.42 kg/m     Wt Readings from Last 3 Encounters:  11/12/19 194 lb 12.8 oz (88.4 kg)  11/08/19 192 lb (87.1 kg)  10/25/19 193 lb (87.5 kg)     GEN: Patient is in no acute distress HEENT: Normal NECK: No JVD; No carotid bruits LYMPHATICS: No lymphadenopathy CARDIAC: Hear sounds regular, 2/6 systolic murmur at the apex. RESPIRATORY:  Clear to auscultation without rales, wheezing or rhonchi  ABDOMEN: Soft, non-tender, non-distended MUSCULOSKELETAL:  No edema; No deformity  SKIN: Warm and dry NEUROLOGIC:  Alert and oriented x 3 PSYCHIATRIC:  Normal affect   Signed, Jenean Lindau, MD  11/12/2019 4:09 PM    [facility]

## 2019-11-24 ENCOUNTER — Encounter: Payer: Self-pay | Admitting: Family Medicine

## 2019-11-25 NOTE — Telephone Encounter (Signed)
Left a message for the spouse to call to get scheduled for a virtual appointment.

## 2019-11-28 ENCOUNTER — Other Ambulatory Visit: Payer: Self-pay | Admitting: Physician Assistant

## 2019-12-01 ENCOUNTER — Encounter: Payer: Self-pay | Admitting: Family Medicine

## 2019-12-01 ENCOUNTER — Telehealth: Payer: Self-pay | Admitting: Cardiology

## 2019-12-01 NOTE — Telephone Encounter (Signed)
Pt. Wife calling to speak with Nurse about his CT scan

## 2019-12-03 NOTE — Telephone Encounter (Signed)
Spoke with precert department. His auth is under MD review at [facility]. Wife was informed and appreciative of call.  Earvin Hansen who schedules cardiac CTAs has been in communication with patient's wife via MyChart and will contact her with testing date as soon as insurance authorization is received.   Loel Dubonnet, NP

## 2019-12-10 DIAGNOSIS — Z96652 Presence of left artificial knee joint: Secondary | ICD-10-CM | POA: Diagnosis not present

## 2019-12-13 ENCOUNTER — Encounter: Payer: Self-pay | Admitting: Cardiology

## 2019-12-13 ENCOUNTER — Other Ambulatory Visit: Payer: Self-pay

## 2019-12-13 ENCOUNTER — Ambulatory Visit (INDEPENDENT_AMBULATORY_CARE_PROVIDER_SITE_OTHER): Payer: Medicare HMO | Admitting: Cardiology

## 2019-12-13 ENCOUNTER — Encounter: Payer: Self-pay | Admitting: Family Medicine

## 2019-12-13 VITALS — BP 134/72 | HR 63 | Ht 72.0 in | Wt 195.0 lb

## 2019-12-13 DIAGNOSIS — I10 Essential (primary) hypertension: Secondary | ICD-10-CM | POA: Diagnosis not present

## 2019-12-13 DIAGNOSIS — R600 Localized edema: Secondary | ICD-10-CM | POA: Diagnosis not present

## 2019-12-13 DIAGNOSIS — I480 Paroxysmal atrial fibrillation: Secondary | ICD-10-CM | POA: Diagnosis not present

## 2019-12-13 MED ORDER — HYDROCHLOROTHIAZIDE 12.5 MG PO CAPS: 12.5000 mg | ORAL_CAPSULE | Freq: Every day | ORAL | 3 refills | Status: DC

## 2019-12-13 NOTE — Progress Notes (Signed)
Cardiology Office Note:    Date:  12/13/2019   ID:  Wesley Fuller, DOB [birth year], MRN [id number]  PCP:  Marin Olp, MD  Cardiologist:  Jenean Lindau, MD   Referring MD: Marin Olp, MD    ASSESSMENT:    1. Paroxysmal atrial fibrillation (HCC)   2. Essential hypertension   3. Pedal edema    PLAN:    In order of problems listed above:  1. Paroxysmal atrial fibrillation: I discussed with the patient atrial fibrillation, disease process. Management and therapy including rate and rhythm control, anticoagulation benefits and potential risks were discussed extensively with the patient. Patient had multiple questions which were answered to patient's satisfaction.  As mentioned in the previous visit we are awaiting for CT scan report for assessing coronary artery disease. 2. Essential hypertension and bilateral pedal edema: Left is greater than right.  We will do a DVT study.  Patient will be taken off amlodipine and initiated on hydrochlorothiazide 12.5 daily.  He will be having a Chem-7 when he comes back for DVT study.  He has an appointment to see me in February after the CT scan and we will discuss findings of the aforementioned at that time.   Medication Adjustments/Labs and Tests Ordered: Current medicines are reviewed at length with the patient today.  Concerns regarding medicines are outlined above.  No orders of the defined types were placed in this encounter.  No orders of the defined types were placed in this encounter.    Chief Complaint  Patient presents with  . Follow-up    1 month     History of Present Illness:    Wesley Fuller is a 78 y.o. male.  Patient has past medical history approximately fibrillation and essential hypertension.  He mentions to me that ever since he is gone of the hydrochlorothiazide he has increasing pedal edema.  No chest pain orthopnea or PND patient is taking amlodipine on a regular basis.  At the time of my evaluation,  the patient is alert awake oriented and in no distress.  Past Medical History:  Diagnosis Date  . Allergy    seasonal  . Asthma    as a child  . Calcium oxalate renal stones   . Cataract 2013  . Diverticulosis   . Dysrhythmia    a fib  . ED (erectile dysfunction)   . GERD (gastroesophageal reflux disease)   . History of kidney stones   . Hypertension   . Melanoma of back ([place]) 1974  . Nail fungal infection   . Osteoarthritis of left knee 03/02/2014    Past Surgical History:  Procedure Laterality Date  . EYE SURGERY     cataract, glaucoma surgery  . HERNIA REPAIR     x 2  . left leg stripping     varicose vein  . lymph node disection  1981  . melanoma removal of back   1974  . NASAL POLYP SURGERY    . skin cancer    . TONSILLECTOMY    . TOTAL KNEE ARTHROPLASTY Left 06/07/2019   Procedure: TOTAL KNEE ARTHROPLASTY;  Surgeon: Gaynelle Arabian, MD;  Location: [facility];  Service: Orthopedics;  Laterality: Left;  11min  . TOTAL KNEE ARTHROPLASTY Left     Current Medications: Current Meds  Medication Sig  . amLODipine (NORVASC) 5 MG tablet Take 0.5 tablets (2.5 mg total) by mouth daily.  . Astaxanthin 4 MG CAPS Take 4 mg by mouth at bedtime.   Marland Kitchen  CALCIUM PO Take 1 tablet by mouth daily.  . cholecalciferol (VITAMIN D3) 25 MCG (1000 UT) tablet Take 1,000 Units by mouth daily.  Marland Kitchen donepezil (ARICEPT) 10 MG tablet Take 1 tablet (10 mg total) by mouth at bedtime.  . irbesartan (AVAPRO) 300 MG tablet TAKE ONE TABLET BY MOUTH ONE TIME DAILY   . Melatonin 5 MG TABS Take 5 mg by mouth at bedtime.  . mirabegron ER (MYRBETRIQ) 25 MG TB24 tablet Take 25 mg by mouth daily.  . rivaroxaban (XARELTO) 20 MG TABS tablet Take 1 tablet (20 mg total) by mouth daily with supper.  . tamsulosin (FLOMAX) 0.4 MG CAPS capsule TAKE ONE CAPSULE BY MOUTH ONE TIME DAILY   . TURMERIC PO Take 1,200 mg by mouth daily.  . vitamin B-12 (CYANOCOBALAMIN) 500 MCG tablet Take 1,000 mcg by mouth daily.      Allergies:   Patient has no known allergies.   Social History   Socioeconomic History  . Marital status: Married    Spouse name: Not on file  . Number of children: 2  . Years of education: Not on file  . Highest education level: Not on file  Occupational History  . Occupation: Retired  Tobacco Use  . Smoking status: Never Smoker  . Smokeless tobacco: Never Used  Substance and Sexual Activity  . Alcohol use: Yes    Alcohol/week: 5.0 standard drinks    Types: 5 Glasses of wine per week    Comment: does not drink very much; glass of wine every day  . Drug use: No  . Sexual activity: Yes    Partners: Female  Other Topics Concern  . Not on file  Social History Narrative   Married ([name]) 33. 2 sons- 1 in [place]. 3 grandsons.       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 Difficulty of Paying Living Expenses: Not on file  Food Insecurity:   . Worried About Charity fundraiser in the Last Year: Not on file  . Ran Out of Food in the Last Year: Not on file  Transportation Needs:   . Lack of Transportation (Medical): Not on file  . Lack of Transportation (Non-Medical): Not on file  Physical Activity:   . Days of Exercise per Week: Not on file  . Minutes of Exercise per Session: Not on file  Stress:   . Feeling of Stress : Not on file  Social Connections:   . Frequency of Communication with Friends and Family: Not on file  . Frequency of Social Gatherings with Friends and Family: Not on file  . Attends Religious Services: Not on file  . Active Member of Clubs or Organizations: Not on file  . Attends Archivist Meetings: Not on file  . Marital Status: Not on file     Family History: The patient's family history includes Colon cancer in his father, paternal uncle, and paternal uncle; Hypertension in an  other family member.  ROS:   Please see the history of present illness.    All other systems reviewed and are negative.  EKGs/Labs/Other Studies Reviewed:    The following studies were reviewed today: EKG reveals sinus rhythm and nonspecific ST-T changes   Recent Labs: 06/02/2019: ALT 26 06/08/2019: BUN 15; Creatinine, Ser 0.79; Hemoglobin 15.1; Platelets 159; Potassium 4.0; Sodium 141  Recent Lipid Panel    Component Value Date/Time   CHOL 159 10/09/2017 0807   TRIG 105.0 10/09/2017 0807   HDL 52.30 10/09/2017 0807   CHOLHDL 3 10/09/2017 0807   VLDL 21.0 10/09/2017 0807   LDLCALC 86 10/09/2017 0807    Physical Exam:    VS:  BP 134/72 (BP Location: Right Arm, Patient Position: Sitting, Cuff Size: Normal)   Pulse 63   Ht 6' (1.829 m)   Wt 195 lb (88.5 kg)   SpO2 98%   BMI 26.45 kg/m     Wt Readings from Last 3 Encounters:  12/13/19 195 lb (88.5 kg)  11/12/19 194 lb 12.8 oz (88.4 kg)  11/08/19 192 lb (87.1 kg)     GEN: Patient is in no acute distress HEENT: Normal NECK: No JVD; No carotid bruits LYMPHATICS: No lymphadenopathy CARDIAC: Hear sounds regular, 2/6 systolic murmur at the apex. RESPIRATORY:  Clear to auscultation without rales, wheezing or rhonchi  ABDOMEN: Soft, non-tender, non-distended MUSCULOSKELETAL:  No edema; No deformity  SKIN: Warm and dry NEUROLOGIC:  Alert and oriented x 3 PSYCHIATRIC:  Normal affect   Signed, Jenean Lindau, MD  12/13/2019 11:47 AM    [place]

## 2019-12-13 NOTE — Addendum Note (Signed)
Addended by: Beckey Rutter on: 12/13/2019 11:56 AM   Modules accepted: Orders

## 2019-12-13 NOTE — Patient Instructions (Addendum)
Medication Instructions:  Your physician has recommended you make the following change in your medication:   START taking amlodipine  START taking HCTZ 12.5mg  (1 tablet) once daily   *If you need a refill on your cardiac medications before your next appointment, please call your pharmacy*  Lab Work: Your physician recommends that you have a BMP drawn on same day as DVT study  If you have labs (blood work) drawn today and your tests are completely normal, you will receive your results only by: Marland Kitchen MyChart Message (if you have MyChart) OR . A paper copy in the mail If you have any lab test that is abnormal or we need to change your treatment, we will call you to review the results.  Testing/Procedures: .You will be scheduled to have a DVT study performed  Follow-Up: At [facility], you and your health needs are our priority.  As part of our continuing mission to provide you with exceptional heart care, we have created designated Provider Care Teams.  These Care Teams include your primary Cardiologist (physician) and Advanced Practice Providers (APPs -  Physician Assistants and Nurse Practitioners) who all work together to provide you with the care you need, when you need it.  Other Instructions  Deep Vein Thrombosis  Deep vein thrombosis (DVT) is a condition in which a blood clot forms in a deep vein, such as a lower leg, thigh, or arm vein. A clot is blood that has thickened into a gel or solid. This condition is dangerous. It can lead to serious and even life-threatening complications if the clot travels to the lungs and causes a blockage (pulmonary embolism). It can also damage veins in the leg. This can result in leg pain, swelling, discoloration, and sores (post-thrombotic syndrome). What are the causes? This condition may be caused by:  A slowdown of blood flow.  Damage to a vein.  A condition that causes blood to clot more easily, such as an inherited clotting  disorder. What increases the risk? The following factors may make you more likely to develop this condition:  Being overweight.  Being older, especially over age 40.  Sitting or lying down for more than four hours.  Being in the hospital.  Lack of physical activity (sedentary lifestyle).  Pregnancy, being in childbirth, or having recently given birth.  Taking medicines that contain estrogen, such as medicines to prevent pregnancy.  Smoking.  A history of any of the following: ? Blood clots or a blood clotting disease. ? Peripheral vascular disease. ? Inflammatory bowel disease. ? Cancer. ? Heart disease. ? Genetic conditions that affect how your blood clots, such as Factor V Leiden mutation. ? Neurological diseases that affect your legs (leg paresis). ? A recent injury, such as a car accident. ? Major or lengthy surgery. ? A central line placed inside a large vein. What are the signs or symptoms? Symptoms of this condition include:  Swelling, pain, or tenderness in an arm or leg.  Warmth, redness, or discoloration in an arm or leg. If the clot is in your leg, symptoms may be more noticeable or worse when you stand or walk. Some people may not develop any symptoms. How is this diagnosed? This condition is diagnosed with:  A medical history and physical exam.  Tests, such as: ? Blood tests. These are done to check how well your blood clots. ? Ultrasound. This is done to check for clots. ? Venogram. For this test, contrast dye is injected into a vein and  X-rays are taken to check for any clots. How is this treated? Treatment for this condition depends on:  The cause of your DVT.  Your risk for bleeding or developing more clots.  Any other medical conditions that you have. Treatment may include:  Taking a blood thinner (anticoagulant). This type of medicine prevents clots from forming. It may be taken by mouth, injected under the skin, or injected through an IV  (catheter).  Injecting clot-dissolving medicines into the affected vein (catheter-directed thrombolysis).  Having surgery. Surgery may be done to: ? Remove the clot. ? Place a filter in a large vein to catch blood clots before they reach the lungs. Some treatments may be continued for up to six months. Follow these instructions at home: If you are taking blood thinners:  Take the medicine exactly as told by your health care provider. Some blood thinners need to be taken at the same time every day. Do not skip a dose.  Talk with your health care provider before you take any medicines that contain aspirin or NSAIDs. These medicines increase your risk for dangerous bleeding.  Ask your health care provider about foods and drugs that could change the way the medicine works (may interact). Avoid those things if your health care provider tells you to do so.  Blood thinners can cause easy bruising and may make it difficult to stop bleeding. Because of this: ? Be very careful when using knives, scissors, or other sharp objects. ? Use an electric razor instead of a blade. ? Avoid activities that could cause injury or bruising, and follow instructions about how to prevent falls.  Wear a medical alert bracelet or carry a card that lists what medicines you take. General instructions  Take over-the-counter and prescription medicines only as told by your health care provider.  Return to your normal activities as told by your health care provider. Ask your health care provider what activities are safe for you.  Wear compression stockings if recommended by your health care provider.  Keep all follow-up visits as told by your health care provider. This is important. How is this prevented? To lower your risk of developing this condition again:  For 30 or more minutes every day, do an activity that: ? Involves moving your arms and legs. ? Increases your heart rate.  When traveling for longer than  four hours: ? Exercise your arms and legs every hour. ? Drink plenty of water. ? Avoid drinking alcohol.  Avoid sitting or lying for a long time without moving your legs.  If you have surgery or you are hospitalized, ask about ways to prevent blood clots. These may include taking frequent walks or using anticoagulants.  Stay at a healthy weight.  If you are a woman who is older than age 20, avoid unnecessary use of medicines that contain estrogen, such as some birth control pills.  Do not use any products that contain nicotine or tobacco, such as cigarettes and e-cigarettes. This is especially important if you take estrogen medicines. If you need help quitting, ask your health care provider. Contact a health care provider if:  You miss a dose of your blood thinner.  Your menstrual period is heavier than usual.  You have unusual bruising. Get help right away if:  You have: ? New or increased pain, swelling, or redness in an arm or leg. ? Numbness or tingling in an arm or leg. ? Shortness of breath. ? Chest pain. ? A rapid or irregular heartbeat. ?  A severe headache or confusion. ? A cut that will not stop bleeding.  There is blood in your vomit, stool, or urine.  You have a serious fall or accident, or you hit your head.  You feel light-headed or dizzy.  You cough up blood. These symptoms may represent a serious problem that is an emergency. Do not wait to see if the symptoms will go away. Get medical help right away. Call your local emergency services (911 in the [place] Do not drive yourself to the hospital. Summary  Deep vein thrombosis (DVT) is a condition in which a blood clot forms in a deep vein, such as a lower leg, thigh, or arm vein.  Symptoms can include swelling, warmth, pain, and redness in your leg or arm.  This condition may be treated with a blood thinner (anticoagulant medicine), medicine that is injected to dissolve blood clots,compression stockings, or  surgery.  If you are prescribed blood thinners, take them exactly as told. This information is not intended to replace advice given to you by your health care provider. Make sure you discuss any questions you have with your health care provider. Document Released: 12/09/2005 Document Revised: 11/21/2017 Document Reviewed: 05/09/2017 Elsevier Patient Education  [place].  Hydrochlorothiazide, HCTZ capsules or tablets What is this medicine? HYDROCHLOROTHIAZIDE (hye droe klor oh THYE a zide) is a diuretic. It increases the amount of urine passed, which causes the body to lose salt and water. This medicine is used to treat high blood pressure. It is also reduces the swelling and water retention caused by various medical conditions, such as heart, liver, or kidney disease. This medicine may be used for other purposes; ask your health care provider or pharmacist if you have questions. COMMON BRAND NAME(S): Esidrix, Ezide, HydroDIURIL, Microzide, Oretic, Zide What should I tell my health care provider before I take this medicine? They need to know if you have any of these conditions:  diabetes  gout  immune system problems, like lupus  kidney disease or kidney stones  liver disease  pancreatitis  small amount of urine or difficulty passing urine  an unusual or allergic reaction to hydrochlorothiazide, sulfa drugs, other medicines, foods, dyes, or preservatives  pregnant or trying to get pregnant  breast-feeding How should I use this medicine? Take this medicine by mouth with a glass of water. Follow the directions on the prescription label. Take your medicine at regular intervals. Remember that you will need to pass urine frequently after taking this medicine. Do not take your doses at a time of day that will cause you problems. Do not stop taking your medicine unless your doctor tells you to. Talk to your pediatrician regarding the use of this medicine in children. Special  care may be needed. Overdosage: If you think you have taken too much of this medicine contact a poison control center or emergency room at once. NOTE: This medicine is only for you. Do not share this medicine with others. What if I miss a dose? If you miss a dose, take it as soon as you can. If it is almost time for your next dose, take only that dose. Do not take double or extra doses. What may interact with this medicine?  cholestyramine  colestipol  digoxin  dofetilide  lithium  medicines for blood pressure  medicines for diabetes  medicines that relax muscles for surgery  other diuretics  steroid medicines like prednisone or cortisone This list may not describe all possible interactions. Give your  health care provider a list of all the medicines, herbs, non-prescription drugs, or dietary supplements you use. Also tell them if you smoke, drink alcohol, or use illegal drugs. Some items may interact with your medicine. What should I watch for while using this medicine? Visit your doctor or health care professional for regular checks on your progress. Check your blood pressure as directed. Ask your doctor or health care professional what your blood pressure should be and when you should contact him or her. You may need to be on a special diet while taking this medicine. Ask your doctor. Check with your doctor or health care professional if you get an attack of severe diarrhea, nausea and vomiting, or if you sweat a lot. The loss of too much body fluid can make it dangerous for you to take this medicine. You may get drowsy or dizzy. Do not drive, use machinery, or do anything that needs mental alertness until you know how this medicine affects you. Do not stand or sit up quickly, especially if you are an older patient. This reduces the risk of dizzy or fainting spells. Alcohol may interfere with the effect of this medicine. Avoid alcoholic drinks. This medicine may increase blood  sugar. Ask your healthcare provider if changes in diet or medicines are needed if you have diabetes. This medicine can make you more sensitive to the sun. Keep out of the sun. If you cannot avoid being in the sun, wear protective clothing and use sunscreen. Do not use sun lamps or tanning beds/booths. What side effects may I notice from receiving this medicine? Side effects that you should report to your doctor or health care professional as soon as possible:  allergic reactions such as skin rash or itching, hives, swelling of the lips, mouth, tongue, or throat  changes in vision  chest pain  eye pain  fast or irregular heartbeat  feeling faint or lightheaded, falls  gout attack  muscle pain or cramps  pain or difficulty when passing urine  pain, tingling, numbness in the hands or feet  redness, blistering, peeling or loosening of the skin, including inside the mouth   signs and symptoms of high blood sugar such as being more thirsty or hungry or having to urinate more than normal. You may also feel very tired or have blurry vision.  unusually weak Side effects that usually do not require medical attention (report to your doctor or health care professional if they continue or are bothersome):  change in sex drive or performance  dry mouth  headache  stomach upset This list may not describe all possible side effects. Call your doctor for medical advice about side effects. You may report side effects to FDA at 1-800-FDA-1088. Where should I keep my medicine? Keep out of the reach of children. Store at room temperature between 15 and 30 degrees C (59 and 86 degrees F). Do not freeze. Protect from light and moisture. Keep container closed tightly. Throw away any unused medicine after the expiration date. NOTE: This sheet is a summary. It may not cover all possible information. If you have questions about this medicine, talk to your doctor, pharmacist, or health care  provider.  2020 Elsevier/Gold Standard (2018-09-30 09:25:06)

## 2019-12-14 ENCOUNTER — Ambulatory Visit (INDEPENDENT_AMBULATORY_CARE_PROVIDER_SITE_OTHER): Payer: Medicare HMO | Admitting: Family Medicine

## 2019-12-14 ENCOUNTER — Encounter: Payer: Self-pay | Admitting: Family Medicine

## 2019-12-14 VITALS — BP 120/70 | HR 71 | Temp 98.6°F | Ht 72.0 in | Wt 195.0 lb

## 2019-12-14 DIAGNOSIS — I480 Paroxysmal atrial fibrillation: Secondary | ICD-10-CM | POA: Diagnosis not present

## 2019-12-14 DIAGNOSIS — I10 Essential (primary) hypertension: Secondary | ICD-10-CM

## 2019-12-14 DIAGNOSIS — F0390 Unspecified dementia without behavioral disturbance: Secondary | ICD-10-CM | POA: Diagnosis not present

## 2019-12-14 DIAGNOSIS — F03A0 Unspecified dementia, mild, without behavioral disturbance, psychotic disturbance, mood disturbance, and anxiety: Secondary | ICD-10-CM

## 2019-12-14 DIAGNOSIS — R739 Hyperglycemia, unspecified: Secondary | ICD-10-CM | POA: Diagnosis not present

## 2019-12-14 DIAGNOSIS — N401 Enlarged prostate with lower urinary tract symptoms: Secondary | ICD-10-CM

## 2019-12-14 DIAGNOSIS — R351 Nocturia: Secondary | ICD-10-CM | POA: Diagnosis not present

## 2019-12-14 DIAGNOSIS — F320 Major depressive disorder, single episode, mild: Secondary | ICD-10-CM

## 2019-12-14 DIAGNOSIS — Z0000 Encounter for general adult medical examination without abnormal findings: Secondary | ICD-10-CM | POA: Diagnosis not present

## 2019-12-14 DIAGNOSIS — R69 Illness, unspecified: Secondary | ICD-10-CM | POA: Diagnosis not present

## 2019-12-14 MED ORDER — SERTRALINE HCL 25 MG PO TABS: 25.0000 mg | ORAL_TABLET | Freq: Every day | ORAL | 5 refills | Status: DC

## 2019-12-14 MED ORDER — IRBESARTAN 300 MG PO TABS: 300.0000 mg | ORAL_TABLET | Freq: Every day | ORAL | 3 refills | Status: DC

## 2019-12-14 MED ORDER — TAMSULOSIN HCL 0.4 MG PO CAPS: 0.4000 mg | ORAL_CAPSULE | Freq: Every day | ORAL | 3 refills | Status: AC

## 2019-12-14 NOTE — Patient Instructions (Signed)
Thanks for doing labs  Lets try zoloft 25 mg each AM  Recommended follow up: Return in about 6 weeks (around 01/25/2020) for follow up- or sooner if needed.

## 2019-12-14 NOTE — Progress Notes (Signed)
Phone: [phone]   Subjective:  Patient presents today for their annual physical. Chief complaint-noted.   This visit occurred during the SARS-CoV-2 public health emergency.  Safety protocols were in place, including screening questions prior to the visit, additional usage of staff PPE, and extensive cleaning of exam room while observing appropriate contact time as indicated for disinfecting solutions.   See problem oriented charting- Review of Systems  Constitutional: Negative.   HENT: Negative.   Eyes: Negative.   Respiratory: Negative.   Cardiovascular: Negative.   Gastrointestinal: Negative.   Genitourinary: Negative.   Musculoskeletal: Negative.   Skin: Negative.   Neurological: Negative.   Endo/Heme/Allergies: Negative.   Psychiatric/Behavioral: Positive for memory loss.       History of alzheimer    The following were reviewed and entered/updated in epic: Past Medical History:  Diagnosis Date  . Allergy    seasonal  . Asthma    as a child  . Calcium oxalate renal stones   . Cataract 2013  . Diverticulosis   . Dysrhythmia    a fib  . ED (erectile dysfunction)   . GERD (gastroesophageal reflux disease)   . History of kidney stones   . Hypertension   . Melanoma of back ([place]) 1974  . Nail fungal infection   . Osteoarthritis of left knee 03/02/2014   Patient Active Problem List   Diagnosis Date Noted  . Paroxysmal atrial fibrillation ([place]) 08/05/2018    Priority: High  . MCI (mild cognitive impairment) 08/22/2015    Priority: High  . BPH associated with nocturia 02/22/2019    Priority: Medium  . Depression, major, single episode, mild ([place]) 06/01/2018    Priority: Medium  . Hyperglycemia 10/15/2016    Priority: Medium  . Essential hypertension 02/09/2008    Priority: Medium  . History of melanoma 02/09/2008    Priority: Medium  . Pedal edema 12/13/2019    Priority: Low  . Osteoarthritis of left knee 06/07/2019    Priority: Low  . Asymmetric SNHL  (sensorineural hearing loss) 04/11/2016    Priority: Low  . OA (osteoarthritis) of knee 03/02/2014    Priority: Low  . VARICOSE VEINS LOWER EXTREMITIES W/INFLAMMATION 02/20/2010    Priority: Low  . GERD 11/21/2008    Priority: Low  . Actinic keratosis 10/12/2008    Priority: Low  . ERECTILE DYSFUNCTION 02/09/2008    Priority: Low  . RENAL CALCULUS, HX OF 02/09/2008    Priority: Low   Past Surgical History:  Procedure Laterality Date  . EYE SURGERY     cataract, glaucoma surgery  . HERNIA REPAIR     x 2  . left leg stripping     varicose vein  . lymph node disection  1981  . melanoma removal of back   1974  . NASAL POLYP SURGERY    . skin cancer    . TONSILLECTOMY    . TOTAL KNEE ARTHROPLASTY Left 06/07/2019   Procedure: TOTAL KNEE ARTHROPLASTY;  Surgeon: Gaynelle Arabian, MD;  Location: [facility];  Service: Orthopedics;  Laterality: Left;  67min  . TOTAL KNEE ARTHROPLASTY Left     Family History  Problem Relation Age of Onset  . Colon cancer Father   . Colon cancer Paternal Uncle   . Colon cancer Paternal Uncle   . Hypertension Other     Medications- reviewed and updated Current Outpatient Medications  Medication Sig Dispense Refill  . Astaxanthin 4 MG CAPS Take 4 mg by mouth at bedtime.     Marland Kitchen  CALCIUM PO Take 1 tablet by mouth daily.    . cholecalciferol (VITAMIN D3) 25 MCG (1000 UT) tablet Take 1,000 Units by mouth daily.    Marland Kitchen donepezil (ARICEPT) 10 MG tablet Take 1 tablet (10 mg total) by mouth at bedtime. 90 tablet 3  . hydrochlorothiazide (MICROZIDE) 12.5 MG capsule Take 1 capsule (12.5 mg total) by mouth daily. 30 capsule 3  . irbesartan (AVAPRO) 300 MG tablet Take 1 tablet (300 mg total) by mouth daily. 90 tablet 3  . Melatonin 5 MG TABS Take 5 mg by mouth at bedtime.    . mirabegron ER (MYRBETRIQ) 25 MG TB24 tablet Take 25 mg by mouth daily.    . rivaroxaban (XARELTO) 20 MG TABS tablet Take 1 tablet (20 mg total) by mouth daily with supper. 90 tablet 2  .  tamsulosin (FLOMAX) 0.4 MG CAPS capsule Take 1 capsule (0.4 mg total) by mouth daily. 90 capsule 3  . TURMERIC PO Take 1,200 mg by mouth daily.    . vitamin B-12 (CYANOCOBALAMIN) 500 MCG tablet Take 1,000 mcg by mouth daily.    . metoprolol tartrate (LOPRESSOR) 50 MG tablet Take 2 tablets (100 mg total) by mouth once for 1 dose. IF HR is greater than 55 BPM two hours prior to test 2 tablet 0  . sertraline (ZOLOFT) 25 MG tablet Take 1 tablet (25 mg total) by mouth daily. 30 tablet 5   No current facility-administered medications for this visit.    Allergies-reviewed and updated No Known Allergies  Social History   Social History Narrative   Married ([name]) 1965. 2 sons- 1 in [place]. 3 grandsons.       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 Oropharynx normal Neck: no thyromegaly CV: RRR no murmurs rubs or gallops Lungs: CTAB no crackles, wheeze, rhonchi Abdomen: soft/nontender/nondistended/normal bowel sounds. No rebound or guarding.  Ext: 1+ edema on right, 2+ on left Skin: warm, dry Neuro: grossly normal, moves all extremities, PERRLA, memory issues   Assessment and Plan  78 y.o. Fuller presenting for annual physical.  Health Maintenance counseling: 1. Anticipatory guidance: Patient counseled regarding regular dental exams q6 months, eye exams yearly,  avoiding smoking and second hand smoke , limiting alcohol to 1 beverages per day. Patient has glass of wine daily with dinner- no increased confusion with that.  2. Risk factor reduction:  Advised patient of need for regular exercise and diet rich and fruits and vegetables to reduce risk of heart attack and stroke. Exercise-  member of gym working for personal trainer to improve balance, 3 times a week. Diet-eats healthy diet a lot of vegetables .  Wt Readings from Last 3 Encounters:  12/14/19 195 lb (88.5 kg)  12/13/19 195 lb (88.5 kg)  11/12/19 194 lb 12.8 oz (88.4 kg)  3. Immunizations/screenings/ancillary studies - interested in covid vaccine Immunization History  Administered Date(s) Administered  . Fluad Quad(high Dose 65+) 09/30/2019  . Hepatitis A 08/11/2007  . Influenza Split 09/08/2012  . Influenza Whole 09/26/2008, 10/04/2009, 09/04/2010, 09/03/2011  . Influenza, High Dose Seasonal PF 10/06/2013, 11/08/2014, 10/15/2016, 09/11/2017, 07/23/2018, 09/22/2018  .  Influenza,inj,Quad PF,6+ Mos 08/22/2015  . Pneumococcal Conjugate-13 12/08/2014  . Pneumococcal Polysaccharide-23 01/28/2006  . Td 01/28/2006  . Tdap 10/16/2016  . Zoster 12/08/2014  . Zoster Recombinat (Shingrix) 05/07/2017, 08/01/2017  4. Prostate cancer screening-  have discontinued PSA testing due to age, discussed may opt in  Lab Results  Component Value Date   PSA 1.84 09/21/2015   PSA 1.65 06/02/2014   PSA 1.50 06/01/2013   5. Colon cancer screening - would like to hold off due to covid.  6. Skin cancer screening- followed by dermatology advised regular sunscreen use. Denies worrisome, changing, or new skin lesions. Melanoma in 1974.  7. Never  smoker  Status of chronic or acute concerns   #memory issues worse since left total knee Dr. Wynelle Link. Isolation has also been hard on him. Also dealing with some depression. Issues with being limited by knee limited walking which he would typically enjoy.  -trying to get back into the memory study with [facility]- then he was not allowed in as being on blood thinner -chance with cardiology with no obvious a fib recurrence to come off xarelto and go on another medication (which is unclear at this time). Apparently they ar awaiting Ct chest  And DVT scan  # Depression S: worsening symptoms  with covid 19 and continued memory issues. Tried lexapro in past but caused fatigue Depression screen [facility] 2/9 12/14/2019  Decreased Interest 1  Down, Depressed, Hopeless 1  PHQ - 2 Score 2  Altered sleeping 2  Tired, decreased energy 0  Change in appetite 0  Feeling bad or failure about yourself  0  Trouble concentrating 3  Moving slowly or fidgety/restless 0  Suicidal thoughts 0  PHQ-9 Score 7  Difficult doing work/chores Not difficult at all  A/P: trial zoloft 25 mg with follow up in 6 weeks- suggested virtual but they prefer in person.  -depression could be worsening memory as well  #Hypertension S:  On hydrochlorothiazide 12.5 mg, irbesartan 300 mg. Had been on amlodipine but stopped by cardiology due to edema this week.  A/P: good control today - continue current meds and update labs    #Atrial fibrillation S: Patient is compliant with Xarelto 20 mg for anticoagulation.  He did not tolerate metoprolol due to fatigue and low blood pressure. Was seen recently by cardiology  A/P: rate controlled without meds. Properly anticoagulated- continue current meds    #Mild dementia- follows with neurology S: Compliant with donepezil 10 mg.  Follows up with Dr. [name] and next visit in January A/P: seems to be worsening. Will continue neuro follow up. Hoping to get enrolled in study if he can work with cardiology to get off [place].     #Hyperglycemia S: Patient with elevated CBGs in the past but has not had elevated A1c's A/P: not fasting today so will check a1c again   #BPH- on flomax. Also on myrbetriq which is helpful. Following with urology- they are giving some samples  Recommended follow up: Return in about 6 weeks (around 01/25/2020) for follow up- or sooner if needed. Future Appointments  Date Time Provider Bas[place]  12/16/2019 11:15 AM [facility] 1 [facility]  12/27/2019  3:00 PM Cameron Sprang, MD [facility] None  01/05/2020 12:30 PM [facility]-CT 1 MC-CT [facility]  01/27/2020  4:20 PM  Marin Olp, MD [facility]  02/08/2020  3:15 PM [name], Reita Cliche, MD [facility] None   Lab/Order associations:not fasting   ICD-10-CM   1. Preventative health care  Z00.00   2. Paroxysmal atrial fibrillation (HCC)  I48.0   3. MCI (mild cognitive impairment)  G31.84   4. Hyperglycemia  R73.9 Hemoglobin A1c  5. Essential hypertension  I10 CBC with Differential/Platelet    Comprehensive metabolic panel    Lipid panel  6. Depression, major, single episode, mild ([place])  F32.0   7. BPH associated with nocturia  N40.1    R35.1     Meds ordered this encounter  Medications  . irbesartan (AVAPRO) 300 MG tablet    Sig: Take 1 tablet (300 mg total) by mouth daily.    Dispense:  90 tablet    Refill:  3  . tamsulosin (FLOMAX) 0.4 MG CAPS capsule    Sig: Take 1 capsule (0.4 mg total) by mouth daily.    Dispense:  90 capsule    Refill:  3  . DISCONTD: sertraline (ZOLOFT) 25 MG tablet    Sig: Take 1 tablet (25 mg total) by mouth daily.    Dispense:  30 tablet    Refill:  5  . sertraline (ZOLOFT) 25 MG tablet    Sig: Take 1 tablet (25 mg total) by mouth daily.    Dispense:  30 tablet    Refill:  5    Return precautions advised.  Garret Reddish, MD

## 2019-12-14 NOTE — Assessment & Plan Note (Signed)
S: worsening symptoms with covid 19 and continued memory issues. Tried lexapro in past but caused fatigue Depression screen [facility] 2/9 12/14/2019  Decreased Interest 1  Down, Depressed, Hopeless 1  PHQ - 2 Score 2  Altered sleeping 2  Tired, decreased energy 0  Change in appetite 0  Feeling bad or failure about yourself  0  Trouble concentrating 3  Moving slowly or fidgety/restless 0  Suicidal thoughts 0  PHQ-9 Score 7  Difficult doing work/chores Not difficult at all  A/P: trial zoloft 25 mg with follow up in 6 weeks- suggested virtual but they prefer in person.  -depression could be worsening memory as well

## 2019-12-14 NOTE — Assessment & Plan Note (Signed)
S:  On hydrochlorothiazide 12.5 mg, irbesartan 300 mg. Had been on amlodipine but stopped by cardiology due to edema this week.  A/P: good control today - continue current meds and update labs

## 2019-12-15 ENCOUNTER — Other Ambulatory Visit: Payer: Self-pay

## 2019-12-15 DIAGNOSIS — R0683 Snoring: Secondary | ICD-10-CM

## 2019-12-15 LAB — COMPREHENSIVE METABOLIC PANEL
ALT: 23 U/L (ref 0–53)
AST: 23 U/L (ref 0–37)
Albumin: 4 g/dL (ref 3.5–5.2)
Alkaline Phosphatase: 70 U/L (ref 39–117)
BUN: 15 mg/dL (ref 6–23)
CO2: 26 mEq/L (ref 19–32)
Calcium: 9.6 mg/dL (ref 8.4–10.5)
Chloride: 106 mEq/L (ref 96–112)
Creatinine, Ser: 0.87 mg/dL (ref 0.40–1.50)
GFR: 84.66 mL/min (ref >60.00)
Glucose, Bld: 100 mg/dL — ABNORMAL HIGH (ref 70–99)
Potassium: 4 mEq/L (ref 3.5–5.1)
Sodium: 142 mEq/L (ref 135–145)
Total Bilirubin: 1.4 mg/dL — ABNORMAL HIGH (ref 0.2–1.2)
Total Protein: 5.9 g/dL — ABNORMAL LOW (ref 6.0–8.3)

## 2019-12-15 LAB — CBC WITH DIFFERENTIAL/PLATELET
Basophils Absolute: 0.1 10*3/uL (ref 0.0–0.1)
Basophils Relative: 1.2 % (ref 0.0–3.0)
Eosinophils Absolute: 0.1 10*3/uL (ref 0.0–0.7)
Eosinophils Relative: 1.9 % (ref 0.0–5.0)
HCT: 44.7 % (ref 39.0–52.0)
Hemoglobin: 15.4 g/dL (ref 13.0–17.0)
Lymphocytes Relative: 20.8 % (ref 12.0–46.0)
Lymphs Abs: 1.6 10*3/uL (ref 0.7–4.0)
MCHC: 34.4 g/dL (ref 30.0–36.0)
MCV: 96.1 fl (ref 78.0–100.0)
Monocytes Absolute: 0.7 10*3/uL (ref 0.1–1.0)
Monocytes Relative: 9.9 % (ref 3.0–12.0)
Neutro Abs: 5 10*3/uL (ref 1.4–7.7)
Neutrophils Relative %: 66.2 % (ref 43.0–77.0)
Platelets: 174 10*3/uL (ref 150.0–400.0)
RBC: 4.65 Mil/uL (ref 4.22–5.81)
RDW: 13.4 % (ref 11.5–15.5)
WBC: 7.5 10*3/uL (ref 4.0–10.5)

## 2019-12-15 LAB — LIPID PANEL
Cholesterol: 146 mg/dL (ref 0–200)
HDL: 60.8 mg/dL (ref >39.00)
LDL Cholesterol: 62 mg/dL (ref 0–99)
NonHDL: 85.55
Total CHOL/HDL Ratio: 2
Triglycerides: 116 mg/dL (ref 0.0–149.0)
VLDL: 23.2 mg/dL (ref 0.0–40.0)

## 2019-12-15 LAB — HEMOGLOBIN A1C: Hgb A1c MFr Bld: 5.1 % (ref 4.6–6.5)

## 2019-12-16 ENCOUNTER — Ambulatory Visit (HOSPITAL_BASED_OUTPATIENT_CLINIC_OR_DEPARTMENT_OTHER): Admission: RE | Admit: 2019-12-16 | Payer: Medicare HMO | Source: Ambulatory Visit

## 2019-12-20 ENCOUNTER — Ambulatory Visit (HOSPITAL_COMMUNITY)
Admission: RE | Admit: 2019-12-20 | Discharge: 2019-12-20 | Disposition: A | Discharge status: Home / Self Care | Payer: Medicare HMO | Source: Ambulatory Visit | Attending: Cardiology | Admitting: Cardiology

## 2019-12-20 ENCOUNTER — Other Ambulatory Visit: Payer: Self-pay

## 2019-12-20 DIAGNOSIS — R600 Localized edema: Secondary | ICD-10-CM | POA: Insufficient documentation

## 2019-12-20 DIAGNOSIS — I480 Paroxysmal atrial fibrillation: Secondary | ICD-10-CM | POA: Diagnosis not present

## 2019-12-20 NOTE — Progress Notes (Signed)
Bilateral lower extremity venous duplex completed. Refer to "CV Proc" under chart review to view preliminary results.  12/20/2019 11:32 AM Kelby Aline., MHA, RVT, RDCS, RDMS

## 2019-12-22 ENCOUNTER — Telehealth: Payer: Self-pay | Admitting: Neurology

## 2019-12-22 NOTE — Telephone Encounter (Signed)
Patient's wife called concerned about Wesley Fuller having a Film/video editor on 12/27/19. She said that she feels he will not benefit from a Virtual and would prefer to have an in office visit. I did explain that this was a Special educational needs teacher. Please Advise. Thank you

## 2019-12-23 NOTE — Telephone Encounter (Signed)
Pls let her know I completely understand however this is our office policy right now, unfortunately.

## 2019-12-23 NOTE — Telephone Encounter (Signed)
Yes Ma'am. °

## 2019-12-27 ENCOUNTER — Encounter: Payer: Self-pay | Admitting: Neurology

## 2019-12-27 ENCOUNTER — Other Ambulatory Visit: Payer: Self-pay

## 2019-12-27 ENCOUNTER — Telehealth (INDEPENDENT_AMBULATORY_CARE_PROVIDER_SITE_OTHER): Payer: Medicare HMO | Admitting: Neurology

## 2019-12-27 VITALS — Ht 71.0 in | Wt 192.0 lb

## 2019-12-27 DIAGNOSIS — F0390 Unspecified dementia without behavioral disturbance: Secondary | ICD-10-CM

## 2019-12-27 DIAGNOSIS — R69 Illness, unspecified: Secondary | ICD-10-CM | POA: Diagnosis not present

## 2019-12-27 DIAGNOSIS — F03B0 Unspecified dementia, moderate, without behavioral disturbance, psychotic disturbance, mood disturbance, and anxiety: Secondary | ICD-10-CM

## 2019-12-27 MED ORDER — DONEPEZIL HCL 10 MG PO TABS: 10.0000 mg | ORAL_TABLET | Freq: Every day | ORAL | 3 refills | Status: DC

## 2019-12-27 MED ORDER — MEMANTINE HCL 10 MG PO TABS: ORAL_TABLET | ORAL | 11 refills | Status: DC

## 2019-12-27 NOTE — Progress Notes (Signed)
Virtual Visit via Video Note The purpose of this virtual visit is to provide medical care while limiting exposure to the novel coronavirus.    Consent was obtained for video visit:  Yes.   Answered questions that patient had about telehealth interaction:  Yes.   I discussed the limitations, risks, security and privacy concerns of performing an evaluation and management service by telemedicine. I also discussed with the patient that there may be a patient responsible charge related to this service. The patient expressed understanding and agreed to proceed.  Pt location: Home Physician Location: office Name of referring provider:  Marin Olp, MD I connected with Wesley Fuller at patients initiation/request on 12/27/2019 at  3:00 PM EST by video enabled telemedicine application and verified that I am speaking with the correct person using two identifiers. Pt MRN:  [id number] Pt DOB:  09-29-1941 Video Participants:  Wesley Fuller;  Wesley Fuller (wife)   History of Present Illness:  The patient was seen as a virtual video visit on 12/27/2019. His wife if present to provide additional information. He was last seen 4 months ago for dementia. [place] 21/30 in August 2020. On his last visit, his wife reported a significant change in cognition post-knee surgery in June 2020, he became unable to manage his own medications. He is on Donepezil 10mg  daily. They were hoping to re-initiate aducanumab through [facility] where he was part of a trial, however he could not participate since he was on Xarelto. They report that in 2 weeks, he will have a cardiac CT and potentially switch Xarelto. His wife reports continued cognitive deterioration. His logic is not good. She manages his medications, but he came up to her one time and took the medications out of schedule. He does not drive. He needs a little help with dressing, with trouble getting clothes over his head, which is new. He sleeps very well, but does wake  up 3x at night to urinate. Mood is pretty good, he was started on sertraline recently which has helped. No paranoia or hallucinations.   History on Initial Assessment 11/29/2015: This is a very pleasant 79 year old right-handed man with a history of hypertension, presenting for evaluation of worsening memory. He started noticing memory changes over the past 9-12 months, however his wife reports noticing changes around 2-1/2 years ago. He mostly notices difficulties with name and word retrieval, as well as misplacing things at home. His wife reports that they were playing bridge 2-1/2 years ago and he got confused with what cards to play. He does not recall this incident. Another time they were visiting their son in [place] and kept changing locations, but he would be confused that whole week as to what day it was or could not recall where they should be. Last September, he was driving out of state for a golf game that he attends once a year, but became so anxious about his drive and uncertain despite being given directions. His wife states it is out of character for him to be anxious. His wife reports that despite writing things down, he would get confused if he said yes to a commitment. He is more unsure of himself on the computer and cellphone, asking his wife to check if an email he wrote looked okay. He has occasional word-finding difficulties and has always had problems multitasking. He denies getting lost driving, no missed medications or bill payments, although his wife pays majority of bills. He reports an odd instance  2-1/2 weeks ago while out of town, he went to bed early and thought he was dreaming that he was in his house but it looked different, he got up and asked people what was going on, thinking he was still dreaming, but was later on told by friends that he did come and ask them this. He was more tired that day from driving long distance. His wife reports that she notices more confusion and memory  issues when he is tired. MMSE at PCP office last 08/22/15 was 27/30. He was offered Aricept but did not start this, instead they have been to [facility] for consideration of joining a research study. He underwent neurocognitive testing but they have not heard results yet.  He denies any headaches, dizziness, diplopia, dysarthria, dysphagia, neck/back pain, focal numbness/tingling/weakness, tremors, no falls. He has a change in stool consistency. He reports chronic poor sense of smell. No REM behavior disorder. He denies any significant head injuries. His brother has Parkinson's dementia and had a good response to Aricept. He drinks one glass of wine a night, 1-2 drinks occasionally on weekends.     Current Outpatient Medications on File Prior to Visit  Medication Sig Dispense Refill  . Astaxanthin 4 MG CAPS Take 4 mg by mouth at bedtime.     Marland Kitchen CALCIUM PO Take 1 tablet by mouth daily.    . cholecalciferol (VITAMIN D3) 25 MCG (1000 UT) tablet Take 1,000 Units by mouth daily.    Marland Kitchen donepezil (ARICEPT) 10 MG tablet Take 1 tablet (10 mg total) by mouth at bedtime. 90 tablet 3  . hydrochlorothiazide (MICROZIDE) 12.5 MG capsule Take 1 capsule (12.5 mg total) by mouth daily. 30 capsule 3  . irbesartan (AVAPRO) 300 MG tablet Take 1 tablet (300 mg total) by mouth daily. 90 tablet 3  . Melatonin 5 MG TABS Take 5 mg by mouth at bedtime.    . mirabegron ER (MYRBETRIQ) 25 MG TB24 tablet Take 25 mg by mouth daily.    . rivaroxaban (XARELTO) 20 MG TABS tablet Take 1 tablet (20 mg total) by mouth daily with supper. 90 tablet 2  . sertraline (ZOLOFT) 25 MG tablet Take 1 tablet (25 mg total) by mouth daily. 30 tablet 5  . tamsulosin (FLOMAX) 0.4 MG CAPS capsule Take 1 capsule (0.4 mg total) by mouth daily. 90 capsule 3  . TURMERIC PO Take 1,200 mg by mouth daily.    . vitamin B-12 (CYANOCOBALAMIN) 500 MCG tablet Take 1,000 mcg by mouth daily.    . metoprolol tartrate (LOPRESSOR) 50 MG tablet Take 2 tablets (100 mg  total) by mouth once for 1 dose. IF HR is greater than 55 BPM two hours prior to test 2 tablet 0   No current facility-administered medications on file prior to visit.     Observations/Objective:   GEN:  The patient appears stated age and is in NAD.  Neurological examination: Patient is awake, alert, oriented x 3. No aphasia or dysarthria. Reduced fluency, able to follow commands. Remote and recent memory impaired. SLUMS 10/30 [employer] Mental Exam 12/27/2019  Weekday Correct 1  Current year 1  What state are we in? 1  Amount spent 0  Amount left 0  # of Animals 1  5 objects recall 1  Number series 1  Hour markers 0  Time correct 0  Placed X in triangle correctly 1  Largest Figure 1  Name of male 2  Date back to work 0  Type of work  0  State she lived in 0  Total score 10    Cranial nerves: Extraocular movements intact with no nystagmus. No facial asymmetry. Motor: moves all extremities symmetrically, at least anti-gravity x 4. No incoordination on finger to nose testing. Gait: narrow-based and steady, able to tandem walk adequately. Negative Romberg test.  Assessment and Plan:   This is a very pleasant 79 yo RH man with hypertension, atrial fibrillation, and mild to moderate dementia. His wife continues to note decline since his knee surgery in June 2020. SLUMS score today 10/30 (MOCA 21/30 in August 2020, 26/30 in May 2020, 22/30 in Sept 2019, 27/30 in December 2016). We discussed adding on Memantine to Donepezil. Side effects and expectations were discussed, start Memantine 10mg  qhs x 2 weeks, then increase to 10mg  BID. They continue to be interested in restarting Aducanumab through trials at [facility], and will speak to the research coordinator about the Memantine. Continue close supervision. He will follow-up in 6 months, they know to call for any changes.    Follow Up Instructions:   -I discussed the assessment and treatment plan with the patient. The patient was  provided an opportunity to ask questions and all were answered. The patient agreed with the plan and demonstrated an understanding of the instructions.   The patient was advised to call back or seek an in-person evaluation if the symptoms worsen or if the condition fails to improve as anticipated.     Cameron Sprang, MD

## 2019-12-28 ENCOUNTER — Encounter: Payer: Self-pay | Admitting: *Deleted

## 2019-12-29 ENCOUNTER — Telehealth: Payer: Self-pay | Admitting: Cardiology

## 2019-12-29 DIAGNOSIS — R600 Localized edema: Secondary | ICD-10-CM | POA: Diagnosis not present

## 2019-12-29 DIAGNOSIS — I480 Paroxysmal atrial fibrillation: Secondary | ICD-10-CM | POA: Diagnosis not present

## 2019-12-29 NOTE — Telephone Encounter (Signed)
Left message to call back  

## 2019-12-29 NOTE — Telephone Encounter (Signed)
Please call patient regarding medication prior to cardiac ct

## 2019-12-30 LAB — BASIC METABOLIC PANEL
BUN/Creatinine Ratio: 21 (ref 10–24)
BUN: 19 mg/dL (ref 8–27)
CO2: 25 mmol/L (ref 20–29)
Calcium: 9.6 mg/dL (ref 8.6–10.2)
Chloride: 105 mmol/L (ref 96–106)
Creatinine, Ser: 0.91 mg/dL (ref 0.76–1.27)
GFR calc Af Amer: 93 mL/min/{1.73_m2} (ref >59)
GFR calc non Af Amer: 80 mL/min/{1.73_m2} (ref >59)
Glucose: 96 mg/dL (ref 65–99)
Potassium: 4.1 mmol/L (ref 3.5–5.2)
Sodium: 144 mmol/L (ref 134–144)

## 2019-12-31 ENCOUNTER — Telehealth: Payer: Self-pay | Admitting: Cardiology

## 2019-12-31 NOTE — Telephone Encounter (Signed)
Please call patient regarding what to do with his Metoprolol before CT...they forgot.Wesley Fuller

## 2020-01-03 ENCOUNTER — Telehealth (HOSPITAL_COMMUNITY): Payer: Self-pay | Admitting: Emergency Medicine

## 2020-01-03 ENCOUNTER — Encounter (HOSPITAL_COMMUNITY): Payer: Self-pay

## 2020-01-03 NOTE — Telephone Encounter (Signed)
phone rings but there was no answer or answering machine to leave message

## 2020-01-04 NOTE — Telephone Encounter (Signed)
Clarified CT instructions for patient again. All questions answered.

## 2020-01-05 ENCOUNTER — Ambulatory Visit (HOSPITAL_COMMUNITY)
Admission: RE | Admit: 2020-01-05 | Discharge: 2020-01-05 | Disposition: A | Discharge status: Home / Self Care | Payer: Medicare HMO | Source: Ambulatory Visit | Attending: Cardiology | Admitting: Cardiology

## 2020-01-05 ENCOUNTER — Other Ambulatory Visit: Payer: Self-pay

## 2020-01-05 DIAGNOSIS — I10 Essential (primary) hypertension: Secondary | ICD-10-CM | POA: Diagnosis not present

## 2020-01-05 DIAGNOSIS — I25119 Atherosclerotic heart disease of native coronary artery with unspecified angina pectoris: Secondary | ICD-10-CM | POA: Diagnosis not present

## 2020-01-05 DIAGNOSIS — I2510 Atherosclerotic heart disease of native coronary artery without angina pectoris: Secondary | ICD-10-CM | POA: Diagnosis not present

## 2020-01-05 DIAGNOSIS — R079 Chest pain, unspecified: Secondary | ICD-10-CM | POA: Diagnosis present

## 2020-01-05 MED ORDER — NITROGLYCERIN 0.4 MG SL SUBL: 0.8000 mg | SUBLINGUAL_TABLET | Freq: Once | SUBLINGUAL | Status: DC

## 2020-01-05 MED ORDER — IOHEXOL 350 MG/ML SOLN
100.0000 mL | Freq: Once | INTRAVENOUS | Status: AC | PRN
  Administered 2020-01-05: 100 mL via INTRAVENOUS

## 2020-01-05 MED ORDER — NITROGLYCERIN 0.4 MG SL SUBL
SUBLINGUAL_TABLET | SUBLINGUAL | Status: AC
  Filled 2020-01-05: qty 2

## 2020-01-05 NOTE — Progress Notes (Signed)
CT scan completed. Tolerated well. D/C home with wife. Awake and alert. In no distress

## 2020-01-06 ENCOUNTER — Ambulatory Visit (HOSPITAL_COMMUNITY)
Admission: RE | Admit: 2020-01-06 | Discharge: 2020-01-06 | Disposition: A | Discharge status: Home / Self Care | Payer: Medicare HMO | Source: Ambulatory Visit | Attending: Cardiology | Admitting: Cardiology

## 2020-01-06 DIAGNOSIS — R079 Chest pain, unspecified: Secondary | ICD-10-CM | POA: Diagnosis not present

## 2020-01-07 DIAGNOSIS — I2510 Atherosclerotic heart disease of native coronary artery without angina pectoris: Secondary | ICD-10-CM | POA: Diagnosis not present

## 2020-01-10 ENCOUNTER — Telehealth: Payer: Self-pay

## 2020-01-10 DIAGNOSIS — R739 Hyperglycemia, unspecified: Secondary | ICD-10-CM

## 2020-01-10 DIAGNOSIS — I10 Essential (primary) hypertension: Secondary | ICD-10-CM

## 2020-01-10 NOTE — Addendum Note (Signed)
Addended by: Beckey Rutter on: 01/10/2020 05:08 PM   Modules accepted: Orders

## 2020-01-10 NOTE — Telephone Encounter (Signed)
Labs and CT results relayed, copy sent to Dr. Yong Channel

## 2020-01-10 NOTE — Telephone Encounter (Signed)
-----   Message from Jenean Lindau, MD sent at 12/30/2019  8:19 AM EST ----- The results of the study is unremarkable. Please inform patient. I will discuss in detail at next appointment. Cc  primary care/referring physician Jenean Lindau, MD 12/30/2019 8:18 AM

## 2020-01-11 DIAGNOSIS — R739 Hyperglycemia, unspecified: Secondary | ICD-10-CM | POA: Diagnosis not present

## 2020-01-11 DIAGNOSIS — I10 Essential (primary) hypertension: Secondary | ICD-10-CM | POA: Diagnosis not present

## 2020-01-12 LAB — HEPATIC FUNCTION PANEL
ALT: 20 IU/L (ref 0–44)
AST: 22 IU/L (ref 0–40)
Albumin: 3.8 g/dL (ref 3.7–4.7)
Alkaline Phosphatase: 90 IU/L (ref 39–117)
Bilirubin Total: 0.9 mg/dL (ref 0.0–1.2)
Bilirubin, Direct: 0.23 mg/dL (ref 0.00–0.40)
Total Protein: 6 g/dL (ref 6.0–8.5)

## 2020-01-12 LAB — LIPID PANEL
Chol/HDL Ratio: 2.2 ratio (ref 0.0–5.0)
Cholesterol, Total: 155 mg/dL (ref 100–199)
HDL: 70 mg/dL (ref >39)
LDL Chol Calc (NIH): 70 mg/dL (ref 0–99)
Triglycerides: 78 mg/dL (ref 0–149)
VLDL Cholesterol Cal: 15 mg/dL (ref 5–40)

## 2020-01-13 ENCOUNTER — Ambulatory Visit (INDEPENDENT_AMBULATORY_CARE_PROVIDER_SITE_OTHER): Payer: Medicare HMO | Admitting: Cardiology

## 2020-01-13 ENCOUNTER — Other Ambulatory Visit: Payer: Self-pay

## 2020-01-13 ENCOUNTER — Encounter: Payer: Self-pay | Admitting: Cardiology

## 2020-01-13 VITALS — BP 142/78 | HR 68 | Ht 71.0 in | Wt 196.0 lb

## 2020-01-13 DIAGNOSIS — I480 Paroxysmal atrial fibrillation: Secondary | ICD-10-CM | POA: Diagnosis not present

## 2020-01-13 DIAGNOSIS — I10 Essential (primary) hypertension: Secondary | ICD-10-CM

## 2020-01-13 DIAGNOSIS — I2510 Atherosclerotic heart disease of native coronary artery without angina pectoris: Secondary | ICD-10-CM

## 2020-01-13 NOTE — Patient Instructions (Signed)
Medication Instructions:  None   *If you need a refill on your cardiac medications before your next appointment, please call your pharmacy*  Lab Work: None  If you have labs (blood work) drawn today and your tests are completely normal, you will receive your results only by: Marland Kitchen MyChart Message (if you have MyChart) OR . A paper copy in the mail If you have any lab test that is abnormal or we need to change your treatment, we will call you to review the results.  Testing/Procedures: None  Follow-Up: At [facility], you and your health needs are our priority.  As part of our continuing mission to provide you with exceptional heart care, we have created designated Provider Care Teams.  These Care Teams include your primary Cardiologist (physician) and Advanced Practice Providers (APPs -  Physician Assistants and Nurse Practitioners) who all work together to provide you with the care you need, when you need it.  Your next appointment:   3 Month FU   The format for your next appointment:   In Person or Virtual   Provider:   Dr. Jyl Heinz, MD   Other Instructions None

## 2020-01-13 NOTE — Progress Notes (Signed)
Cardiology Office Note:    Date:  01/13/2020   ID:  Wesley Fuller, DOB 07-10-1941, MRN [id number]  PCP:  Marin Olp, MD  Cardiologist:  Jenean Lindau, MD   Referring MD: Marin Olp, MD    ASSESSMENT:    1. Paroxysmal atrial fibrillation (HCC)   2. Essential hypertension   3. Coronary artery disease involving native coronary artery of native heart without angina pectoris    PLAN:    In order of problems listed above:  1. Coronary artery disease: Secondary prevention stressed with the patient.  Importance of compliance with diet and medication stressed and he vocalized understanding.  I discussed about low-dose statin therapy but his lipids are excellent.  They are going to get back to me and let us know whether they are willing to initiate this.  I will leave it up to them and respect their wishes.  Importance of regular exercise stressed. 2. Paroxysmal atrial fibrillation: As mentioned below the patient plans to undergo enrollment in a study which needs him to be coming off anticoagulation.  With this in mind I wanted to initiate flecainide but now this is a challenge of the new CT coronary angiography FFR findings.  I will discuss this with my electrophysiology colleagues to see if we can still initiate flecainide for this gentleman.  Would consider amiodarone but he cannot tolerate it. 3. I discussed with the patient atrial fibrillation, disease process. Management and therapy including rate and rhythm control, anticoagulation benefits and potential risks were discussed extensively with the patient. Patient had multiple questions which were answered to patient's satisfaction. 4. Patient will be seen in follow-up appointment in 3 months or earlier if the patient has any concerns   Medication Adjustments/Labs and Tests Ordered: Current medicines are reviewed at length with the patient today.  Concerns regarding medicines are outlined above.  No orders of the defined  types were placed in this encounter.  No orders of the defined types were placed in this encounter.    Chief Complaint  Patient presents with  . Follow-up    CT FU      History of Present Illness:    Wesley Fuller is a 79 y.o. male.  Patient has past medical history of paroxysmal atrial fibrillation and essential hypertension.  He is on anticoagulation.  He is planning to undergo enrollment in a study for dementia.  This needs for him to be off anticoagulation and is at significant risk of thromboembolism.  For this reason we wanted to put him on a medication such as flecainide and we wanted to assess the coronary arteries.  Interestingly his coronary evaluation revealed coronary artery disease and therefore this is now a new challenge to see whether he can be initiated on medication such as flecainide.  Past Medical History:  Diagnosis Date  . Allergy    seasonal  . Asthma    as a child  . Calcium oxalate renal stones   . Cataract 2013  . Diverticulosis   . Dysrhythmia    a fib  . ED (erectile dysfunction)   . GERD (gastroesophageal reflux disease)   . History of kidney stones   . Hypertension   . Melanoma of back ([place]) 1974  . Nail fungal infection   . Osteoarthritis of left knee 03/02/2014    Past Surgical History:  Procedure Laterality Date  . EYE SURGERY     cataract, glaucoma surgery  . HERNIA REPAIR  x 2  . left leg stripping     varicose vein  . lymph node disection  1981  . melanoma removal of back   1974  . NASAL POLYP SURGERY    . skin cancer    . TONSILLECTOMY    . TOTAL KNEE ARTHROPLASTY Left 06/07/2019   Procedure: TOTAL KNEE ARTHROPLASTY;  Surgeon: Gaynelle Arabian, MD;  Location: [facility];  Service: Orthopedics;  Laterality: Left;  69min  . TOTAL KNEE ARTHROPLASTY Left     Current Medications: Current Meds  Medication Sig  . Astaxanthin 4 MG CAPS Take 4 mg by mouth at bedtime.   Marland Kitchen CALCIUM PO Take 1 tablet by mouth daily.  . cholecalciferol  (VITAMIN D3) 25 MCG (1000 UT) tablet Take 1,000 Units by mouth daily.  Marland Kitchen donepezil (ARICEPT) 10 MG tablet Take 1 tablet (10 mg total) by mouth at bedtime.  . hydrochlorothiazide (MICROZIDE) 12.5 MG capsule Take 1 capsule (12.5 mg total) by mouth daily.  . irbesartan (AVAPRO) 300 MG tablet Take 1 tablet (300 mg total) by mouth daily.  . Melatonin 5 MG TABS Take 5 mg by mouth at bedtime.  . memantine (NAMENDA) 10 MG tablet Take 1 tablet every night for 2 weeks, then increase to 1 tablet twice a day  . mirabegron ER (MYRBETRIQ) 25 MG TB24 tablet Take 25 mg by mouth daily.  . rivaroxaban (XARELTO) 20 MG TABS tablet Take 1 tablet (20 mg total) by mouth daily with supper.  . sertraline (ZOLOFT) 25 MG tablet Take 1 tablet (25 mg total) by mouth daily.  . tamsulosin (FLOMAX) 0.4 MG CAPS capsule Take 1 capsule (0.4 mg total) by mouth daily.  . TURMERIC PO Take 1,200 mg by mouth daily.  . vitamin B-12 (CYANOCOBALAMIN) 500 MCG tablet Take 1,000 mcg by mouth daily.     Allergies:   Patient has no known allergies.   Social History   Socioeconomic History  . Marital status: Married    Spouse name: Not on file  . Number of children: 2  . Years of education: Not on file  . Highest education level: Not on file  Occupational History  . Occupation: Retired  Tobacco Use  . Smoking status: Never Smoker  . Smokeless tobacco: Never Used  Substance and Sexual Activity  . Alcohol use: Yes    Alcohol/week: 5.0 standard drinks    Types: 5 Glasses of wine per week    Comment: does not drink very much; glass of wine every day  . Drug use: No  . Sexual activity: Yes    Partners: Female  Other Topics Concern  . Not on file  Social History Narrative   Married ([name]) 51. 2 sons- 1 in [place]. 3 grandsons.       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 Difficulty of Paying Living Expenses: Not on file  Food Insecurity:   . Worried About Charity fundraiser in the Last Year: Not on file  . Ran Out of Food in the Last Year: Not on file  Transportation Needs:   . Lack of Transportation (Medical): Not on file  . Lack of Transportation (Non-Medical): Not on file  Physical Activity:   . Days of Exercise per Week: Not on file  . Minutes of Exercise per Session: Not on file  Stress:   .  Feeling of Stress : Not on file  Social Connections:   . Frequency of Communication with Friends and Family: Not on file  . Frequency of Social Gatherings with Friends and Family: Not on file  . Attends Religious Services: Not on file  . Active Member of Clubs or Organizations: Not on file  . Attends Archivist Meetings: Not on file  . Marital Status: Not on file     Family History: The patient's family history includes Colon cancer in his father, paternal uncle, and paternal uncle; Hypertension in an other family member.  ROS:   Please see the history of present illness.    All other systems reviewed and are negative.  EKGs/Labs/Other Studies Reviewed:    The following studies were reviewed today: EXAM: CT FFR ANALYSIS  CLINICAL DATA:  79 year old male with hypertension.  FINDINGS: FFRct analysis was performed on the original cardiac CT angiogram dataset. Diagrammatic representation of the FFRct analysis is provided in a separate PDF document in PACS. This dictation was created using the PDF document and an interactive 3D model of the results. 3D model is not available in the EMR/PACS. Normal FFR range is >0.80.  1. Left Main:  No significant stenosis.  2. LAD: No significant stenosis. 3. LCX: No significant stenosis. 4. Ramus: No significant stenosis. 5. RCA: No significant stenosis.  IMPRESSION: 1.  CT FFR analysis didn't show any significant stenosis.   Electronically Signed   By: Berniece Salines MD   On:  01/07/2020 20:01  IMPRESSION: 1. Coronary calcium score of 223. This was 66 percentile for age and sex matched control.  2. Normal coronary origin with right dominance.  3. Moderate Coronary artery disease, with a moderate (50-69%) calcified plaque in the mid portion of the LAD. The study will be sent for FFR.  4.  Mild to moderate mitral annular calcification.  Berniece Salines, DO   Summary: Right: There is no evidence of deep vein thrombosis in the lower extremity. No cystic structure found in the popliteal fossa. Left: There is no evidence of deep vein thrombosis in the lower extremity. No cystic structure found in the popliteal fossa.   *See table(s) above for measurements and observations.  Electronically signed by Ruta Hinds MD on 12/21/2019 at 1:48:09 PM.   Recent Labs: 12/14/2019: Hemoglobin 15.4; Platelets 174.0 12/29/2019: BUN 19; Creatinine, Ser 0.91; Potassium 4.1; Sodium 144 01/11/2020: ALT 20  Recent Lipid Panel    Component Value Date/Time   CHOL 155 01/11/2020 0922   TRIG 78 01/11/2020 0922   HDL 70 01/11/2020 0922   CHOLHDL 2.2 01/11/2020 0922   CHOLHDL 2 12/14/2019 1604   VLDL 23.2 12/14/2019 1604   LDLCALC 70 01/11/2020 0922    Physical Exam:    VS:  BP (!) 142/78   Pulse 68   Ht 5\' 11"  (1.803 m)   Wt 196 lb (88.9 kg)   SpO2 97%   BMI 27.34 kg/m     Wt Readings from Last 3 Encounters:  01/13/20 196 lb (88.9 kg)  12/27/19 192 lb (87.1 kg)  12/14/19 195 lb (88.5 kg)     GEN: Patient is in no acute distress HEENT: Normal NECK: No JVD; No carotid bruits LYMPHATICS: No lymphadenopathy CARDIAC: Hear sounds regular, 2/6 systolic murmur at the apex. RESPIRATORY:  Clear to auscultation without rales, wheezing or rhonchi  ABDOMEN: Soft, non-tender, non-distended MUSCULOSKELETAL:  No edema; No deformity  SKIN: Warm and dry NEUROLOGIC:  Alert and oriented x 3  PSYCHIATRIC:  Normal affect   Signed, Jenean Lindau, MD  01/13/2020 11:36 AM     [place]

## 2020-01-14 ENCOUNTER — Telehealth: Payer: Self-pay

## 2020-01-14 MED ORDER — IRBESARTAN 300 MG PO TABS: 300.0000 mg | ORAL_TABLET | Freq: Every day | ORAL | 3 refills | Status: DC

## 2020-01-14 MED ORDER — ATORVASTATIN CALCIUM 10 MG PO TABS: 10.0000 mg | ORAL_TABLET | Freq: Every day | ORAL | 3 refills | Status: DC

## 2020-01-14 NOTE — Telephone Encounter (Signed)
90 day refill for irbesartan sent

## 2020-01-14 NOTE — Addendum Note (Signed)
Addended by: Beckey Rutter on: 01/14/2020 08:22 AM   Modules accepted: Orders

## 2020-01-19 ENCOUNTER — Telehealth: Payer: Self-pay | Admitting: Cardiology

## 2020-01-19 NOTE — Telephone Encounter (Signed)
Patient's wife is calling to see whether Dr. Geraldo Pitter has decided whether the patient can come off of Xarelto. He is being re-enrolled in a clinical trial that requires him to come off of the Xarelto. The patient's wife states that Dr. Geraldo Pitter was going to talk with some colleagues to see if it was okay for him to come off of the Xarelto or if it would not be safe. She states that if the patient does come off and there ends up being heart issues then the patient could easily drop out of the trial. She states that they will need an answer by this Friday because he will have to be off of Xarelto for one month prior to the study which starts on March 1st. She was also asking about the patient starting on a statin. It looks like prescription for atorvastatin 10 mg daily was sent in on 01/22. Patient's wife states that she will go pick that up and have him start on it.

## 2020-01-19 NOTE — Telephone Encounter (Signed)
Wife of the patient called. Wife wanted to know what Dr. Geraldo Pitter decided in regards to a possible change in the patient's medication. The wife will need to know if his Xarelto can be stopped so he would qualify for an Alzheimer's Clinical Trial. The trial was halted, but will be starting up again because it was successful. The Clinical Trial was the one to determine  The deadline for answers by the end of the week.   The wife sent a MyChart message as well, but she has not gotten a response yet.

## 2020-01-27 ENCOUNTER — Ambulatory Visit: Payer: Medicare HMO | Admitting: Family Medicine

## 2020-01-27 DIAGNOSIS — I4891 Unspecified atrial fibrillation: Secondary | ICD-10-CM | POA: Diagnosis not present

## 2020-02-08 ENCOUNTER — Ambulatory Visit: Payer: Medicare HMO | Admitting: Cardiology

## 2020-02-10 ENCOUNTER — Ambulatory Visit (INDEPENDENT_AMBULATORY_CARE_PROVIDER_SITE_OTHER): Payer: Medicare HMO | Admitting: Family Medicine

## 2020-02-10 ENCOUNTER — Encounter: Payer: Self-pay | Admitting: Family Medicine

## 2020-02-10 ENCOUNTER — Other Ambulatory Visit: Payer: Self-pay

## 2020-02-10 VITALS — BP 130/81 | HR 79 | Ht 71.0 in | Wt 196.0 lb

## 2020-02-10 DIAGNOSIS — F0390 Unspecified dementia without behavioral disturbance: Secondary | ICD-10-CM

## 2020-02-10 DIAGNOSIS — I480 Paroxysmal atrial fibrillation: Secondary | ICD-10-CM

## 2020-02-10 DIAGNOSIS — F03A0 Unspecified dementia, mild, without behavioral disturbance, psychotic disturbance, mood disturbance, and anxiety: Secondary | ICD-10-CM

## 2020-02-10 DIAGNOSIS — R0683 Snoring: Secondary | ICD-10-CM | POA: Diagnosis not present

## 2020-02-10 DIAGNOSIS — R69 Illness, unspecified: Secondary | ICD-10-CM | POA: Diagnosis not present

## 2020-02-10 DIAGNOSIS — F325 Major depressive disorder, single episode, in full remission: Secondary | ICD-10-CM

## 2020-02-10 NOTE — Patient Instructions (Signed)
There are no preventive care reminders to display for this patient.  Depression screen [facility] 2/9 02/10/2020 12/14/2019 10/25/2019  Decreased Interest 1 1 0  Down, Depressed, Hopeless 0 1 0  PHQ - 2 Score 1 2 0  Altered sleeping 0 2 -  Tired, decreased energy 3 0 -  Change in appetite 0 0 -  Feeling bad or failure about yourself  0 0 -  Trouble concentrating 3 3 -  Moving slowly or fidgety/restless 0 0 -  Suicidal thoughts 0 0 -  PHQ-9 Score 7 7 -  Difficult doing work/chores Not difficult at all Not difficult at all -    Recommended follow up: No follow-ups on file.

## 2020-02-10 NOTE — Assessment & Plan Note (Signed)
S: Patient started on zoloft 25 mg at last visit.  Mild improvement in symptoms not sure if related to zoloft or some changes as below.  New puppy as of Monday. covid 19 vaccine received and feeling a little more comfortable with basic life choices like going to grocery store providing some relief Depression screen PHQ 2/9 02/10/2020  Decreased Interest 1  Down, Depressed, Hopeless 0  PHQ - 2 Score 1  Altered sleeping 0  Tired, decreased energy 3  Change in appetite 0  Feeling bad or failure about yourself  0  Trouble concentrating 3  Moving slowly or fidgety/restless 0  Suicidal thoughts 0  PHQ-9 Score 7  Difficult doing work/chores Not difficult at all  A/P: PHQ9 similar at 7. Depressed mood has resolved. Tired score higher but snoring worse and wonders about OSA as below. I would consider this as remission considering trouble concentration issues are related to dementia. We will continue current meds -if symptoms not continuing to improve then could consider 50mg  dose of zoloft in future.  - in past had fatigue on lexapro

## 2020-02-10 NOTE — Progress Notes (Signed)
Phone [phone] Virtual visit via Video note   Subjective:  Chief complaint: Chief Complaint  Patient presents with  . Follow-up  . Hypertension  . Dementia  . Atrial Fibrillation   This visit type was conducted due to national recommendations for restrictions regarding the COVID-19 Pandemic (e.g. social distancing).  This format is felt to be most appropriate for this patient at this time balancing risks to patient and risks to population by having him in for in person visit.  No physical exam was performed (except for noted visual exam or audio findings with Telehealth visits).    Our team/I connected with [name] at 11:00 AM EST by a video enabled telemedicine application ([email or web address] or caregility through epic) and verified that I am speaking with the correct person using two identifiers.  Location patient: [place] Location provider: [facility], office Persons participating in the virtual visit:  patient  Our team/I discussed the limitations of evaluation and management by telemedicine and the availability of in person appointments. In light of current covid-19 pandemic, patient also understands that we are trying to protect them by minimizing in office contact if at all possible.  The patient expressed consent for telemedicine visit and agreed to proceed. Patient understands insurance will be billed.   Past Medical History-  Patient Active Problem List   Diagnosis Date Noted  . CAD (coronary artery disease) 01/13/2020    Priority: High  . Paroxysmal atrial fibrillation ([place]) 08/05/2018    Priority: High  . Mild dementia ([place]) 08/22/2015    Priority: High  . BPH associated with nocturia 02/22/2019    Priority: Medium  . Depression, major, single episode, mild ([place]) 06/01/2018    Priority: Medium  . Hyperglycemia 10/15/2016    Priority: Medium  . Essential hypertension 02/09/2008    Priority: Medium  . History of melanoma 02/09/2008    Priority: Medium  . Pedal  edema 12/13/2019    Priority: Low  . Osteoarthritis of left knee 06/07/2019    Priority: Low  . Asymmetric SNHL (sensorineural hearing loss) 04/11/2016    Priority: Low  . OA (osteoarthritis) of knee 03/02/2014    Priority: Low  . VARICOSE VEINS LOWER EXTREMITIES W/INFLAMMATION 02/20/2010    Priority: Low  . GERD 11/21/2008    Priority: Low  . Actinic keratosis 10/12/2008    Priority: Low  . ERECTILE DYSFUNCTION 02/09/2008    Priority: Low  . RENAL CALCULUS, HX OF 02/09/2008    Priority: Low    Medications- reviewed and updated Current Outpatient Medications  Medication Sig Dispense Refill  . Astaxanthin 4 MG CAPS Take 4 mg by mouth at bedtime.     Marland Kitchen atorvastatin (LIPITOR) 10 MG tablet Take 1 tablet (10 mg total) by mouth daily. 90 tablet 3  . CALCIUM PO Take 1 tablet by mouth daily.    . cholecalciferol (VITAMIN D3) 25 MCG (1000 UT) tablet Take 1,000 Units by mouth daily.    Marland Kitchen donepezil (ARICEPT) 10 MG tablet Take 1 tablet (10 mg total) by mouth at bedtime. 90 tablet 3  . hydrochlorothiazide (MICROZIDE) 12.5 MG capsule Take 1 capsule (12.5 mg total) by mouth daily. 30 capsule 3  . irbesartan (AVAPRO) 300 MG tablet Take 1 tablet (300 mg total) by mouth daily. 90 tablet 3  . Melatonin 5 MG TABS Take 5 mg by mouth at bedtime.    . memantine (NAMENDA) 10 MG tablet Take 1 tablet every night for 2 weeks, then increase to 1 tablet twice a  day 60 tablet 11  . mirabegron ER (MYRBETRIQ) 25 MG TB24 tablet Take 25 mg by mouth daily.    . rivaroxaban (XARELTO) 20 MG TABS tablet Take 1 tablet (20 mg total) by mouth daily with supper. 90 tablet 2  . sertraline (ZOLOFT) 25 MG tablet Take 1 tablet (25 mg total) by mouth daily. 30 tablet 5  . tamsulosin (FLOMAX) 0.4 MG CAPS capsule Take 1 capsule (0.4 mg total) by mouth daily. 90 capsule 3  . TURMERIC PO Take 1,200 mg by mouth daily.    . vitamin B-12 (CYANOCOBALAMIN) 500 MCG tablet Take 1,000 mcg by mouth daily.     No current  facility-administered medications for this visit.     Objective:  BP 130/81   Pulse 79   Ht 5\' 11"  (1.803 m)   Wt 196 lb (88.9 kg)   BMI 27.34 kg/m  self reported vitals Gen: NAD, resting comfortably Lungs: nonlabored, normal respiratory rate  Skin: appears dry, no obvious rash    Assessment and Plan   # Depression S: Patient started on zoloft 25 mg at last visit.  Mild improvement in symptoms not sure if related to zoloft or some changes as below.  New puppy as of Monday. covid 19 vaccine received and feeling a little more comfortable with basic life choices like going to grocery store providing some relief Depression screen PHQ 2/9 02/10/2020  Decreased Interest 1  Down, Depressed, Hopeless 0  PHQ - 2 Score 1  Altered sleeping 0  Tired, decreased energy 3  Change in appetite 0  Feeling bad or failure about yourself  0  Trouble concentrating 3  Moving slowly or fidgety/restless 0  Suicidal thoughts 0  PHQ-9 Score 7  Difficult doing work/chores Not difficult at all  A/P: PHQ9 similar at 7. Depressed mood has resolved. Tired score higher but snoring worse and wonders about OSA as below. I would consider this as remission considering trouble concentration issues are related to dementia. We will continue current meds -if symptoms not continuing to improve then could consider 50mg  dose of zoloft in future.   # Snoring S:patient has been a long term snoring but has been much heavier lately. Also seems to be more tired in daytime  A/P: suspect OSA- will refer to pulmonary.   # atrial fibrillation/dementia- was trying to get off xarelto to get into study at [facility] related to dementia but unable to do so. Considering a brain stimulator option through [facility] but would still require general anesthesia (shorter but he hasnt tolerated well in past)  - for a fib appears stable- continue xarelto for anticoagulation. Not on rate control due to intolerance in past -for dementia-  will follow along with  Donepezil and Dr. [name] follow up they are going to consider brain stimulation option.   Recommended follow up:  We did not schedule planned follow up- 6 months would be reasonable  Future Appointments  Date Time Provider [place]  07/07/2020 11:30 AM Cameron Sprang, MD [facility] None    Lab/Order associations:   ICD-10-CM   1. Major depressive disorder with single episode, in full remission ([place])  F32.5   2. Snoring  R06.83 Ambulatory referral to Pulmonology  3. Paroxysmal atrial fibrillation (HCC)  I48.0   4. Mild dementia ([place])  F03.90    Time Spent: 35 minutes of total time (11:15 AM- 11:50 AM) was spent on the date of the encounter performing the following actions: chart review prior to seeing  the patient, obtaining history, performing a medically necessary exam, counseling on the treatment plan, placing orders, and documenting in our EHR.   Return precautions advised.  Garret Reddish, MD

## 2020-02-27 ENCOUNTER — Encounter: Payer: Self-pay | Admitting: Family Medicine

## 2020-02-28 ENCOUNTER — Encounter: Payer: Self-pay | Admitting: Family Medicine

## 2020-02-28 NOTE — Telephone Encounter (Signed)
Added from another message sent by patient today:  New thought - years ago [name] had nasal polyps removed.  Could their regrowth be causing the loud snoring?  Should we look into this or proceed with the sleep apnea test first?  I literally can not sleep in the same room with him right now and that is a new situation.  Thanks.  Wesley Fuller

## 2020-02-28 NOTE — Telephone Encounter (Signed)
Added to open message that has been sent to provider.

## 2020-03-06 ENCOUNTER — Encounter: Payer: Self-pay | Admitting: Family Medicine

## 2020-03-06 MED ORDER — SERTRALINE HCL 50 MG PO TABS: 50.0000 mg | ORAL_TABLET | Freq: Every day | ORAL | 5 refills | Status: DC

## 2020-03-08 DIAGNOSIS — R69 Illness, unspecified: Secondary | ICD-10-CM | POA: Diagnosis not present

## 2020-03-19 ENCOUNTER — Other Ambulatory Visit: Payer: Self-pay | Admitting: Cardiology

## 2020-03-20 ENCOUNTER — Telehealth: Payer: Self-pay | Admitting: Cardiology

## 2020-03-20 NOTE — Telephone Encounter (Signed)
Informed pt's wife not to double up on Xarelto to resume as prescribed  Will forward to Dr Geraldo Pitter for review .Adonis Housekeeper

## 2020-03-20 NOTE — Telephone Encounter (Signed)
Need office visit for future refills ./cy

## 2020-03-20 NOTE — Telephone Encounter (Signed)
New Message:   Pt forgot to take his Xarelto last night. Should he take 2 Xarelto today?

## 2020-03-20 NOTE — Telephone Encounter (Signed)
*  STAT* If patient is at the pharmacy, call can be transferred to refill team.   1. Which medications need to be refilled? (please list name of each medication and dose if known) Xarelto- need this asap- only have 1 left  2. Which pharmacy/location (including street and city if local pharmacy) is medication to be sent to? [facility]  3. Do they need a 30 day or 90 day supply? 90 and refills

## 2020-03-22 ENCOUNTER — Telehealth: Payer: Self-pay | Admitting: Family Medicine

## 2020-03-22 ENCOUNTER — Encounter: Payer: Self-pay | Admitting: Family Medicine

## 2020-03-22 NOTE — Telephone Encounter (Signed)
Pt wife called stating pt has been sleeping more than regular lately. Pt wife states she spoke with pharmacist and they believe the Zoloft 50 MG may be causing the drowsiness. Pt wife asked for Dr. Yong Channel or medical assistant to give her a call.

## 2020-03-23 NOTE — Telephone Encounter (Signed)
Called wife let her know Dr. Yong Channel is out of office and I have sent my chart message to him. Not to make any changes in his medications until she has received information from [place]. We will call as soon as message has been reviewed but may be Monday. She will call if any changes or questions.

## 2020-03-29 ENCOUNTER — Encounter: Payer: Self-pay | Admitting: Family Medicine

## 2020-03-29 NOTE — Telephone Encounter (Signed)
Please advise 

## 2020-04-05 ENCOUNTER — Encounter: Payer: Self-pay | Admitting: Family Medicine

## 2020-04-11 ENCOUNTER — Encounter: Payer: Self-pay | Admitting: Family Medicine

## 2020-04-11 ENCOUNTER — Ambulatory Visit: Payer: Medicare HMO | Admitting: Physician Assistant

## 2020-04-11 ENCOUNTER — Other Ambulatory Visit: Payer: Self-pay

## 2020-04-11 ENCOUNTER — Ambulatory Visit (INDEPENDENT_AMBULATORY_CARE_PROVIDER_SITE_OTHER): Payer: Medicare Other | Admitting: Family Medicine

## 2020-04-11 VITALS — BP 138/76 | HR 72 | Temp 98.5°F | Ht 71.0 in | Wt 190.0 lb

## 2020-04-11 DIAGNOSIS — H6121 Impacted cerumen, right ear: Secondary | ICD-10-CM

## 2020-04-11 DIAGNOSIS — R0683 Snoring: Secondary | ICD-10-CM

## 2020-04-11 DIAGNOSIS — R5383 Other fatigue: Secondary | ICD-10-CM

## 2020-04-11 NOTE — Progress Notes (Signed)
Phone [phone] In person visit   Subjective:   Wesley Fuller is a 79 y.o. year old very pleasant male patient who presents for/with See problem oriented charting Chief Complaint  Patient presents with  . Cerumen Impaction   This visit occurred during the SARS-CoV-2 public health emergency.  Safety protocols were in place, including screening questions prior to the visit, additional usage of staff PPE, and extensive cleaning of exam room while observing appropriate contact time as indicated for disinfecting solutions.   Past Medical History-  Patient Active Problem List   Diagnosis Date Noted  . CAD (coronary artery disease) 01/13/2020    Priority: High  . Paroxysmal atrial fibrillation ([place]) 08/05/2018    Priority: High  . Mild dementia ([place]) 08/22/2015    Priority: High  . BPH associated with nocturia 02/22/2019    Priority: Medium  . Depression, major, single episode, mild ([place]) 06/01/2018    Priority: Medium  . Hyperglycemia 10/15/2016    Priority: Medium  . Essential hypertension 02/09/2008    Priority: Medium  . History of melanoma 02/09/2008    Priority: Medium  . Pedal edema 12/13/2019    Priority: Low  . Osteoarthritis of left knee 06/07/2019    Priority: Low  . Asymmetric SNHL (sensorineural hearing loss) 04/11/2016    Priority: Low  . OA (osteoarthritis) of knee 03/02/2014    Priority: Low  . VARICOSE VEINS LOWER EXTREMITIES W/INFLAMMATION 02/20/2010    Priority: Low  . GERD 11/21/2008    Priority: Low  . Actinic keratosis 10/12/2008    Priority: Low  . ERECTILE DYSFUNCTION 02/09/2008    Priority: Low  . RENAL CALCULUS, HX OF 02/09/2008    Priority: Low    Medications- reviewed and updated Current Outpatient Medications  Medication Sig Dispense Refill  . Astaxanthin 4 MG CAPS Take 4 mg by mouth at bedtime.     Marland Kitchen atorvastatin (LIPITOR) 10 MG tablet Take 1 tablet (10 mg total) by mouth daily. 90 tablet 3  . CALCIUM PO Take 1 tablet by mouth  daily.    . cholecalciferol (VITAMIN D3) 25 MCG (1000 UT) tablet Take 1,000 Units by mouth daily.    Marland Kitchen donepezil (ARICEPT) 10 MG tablet Take 1 tablet (10 mg total) by mouth at bedtime. 90 tablet 3  . irbesartan (AVAPRO) 300 MG tablet Take 1 tablet (300 mg total) by mouth daily. 90 tablet 3  . Melatonin 5 MG TABS Take 5 mg by mouth at bedtime.    . memantine (NAMENDA) 10 MG tablet Take 1 tablet every night for 2 weeks, then increase to 1 tablet twice a day 60 tablet 11  . mirabegron ER (MYRBETRIQ) 25 MG TB24 tablet Take 25 mg by mouth daily.    . tamsulosin (FLOMAX) 0.4 MG CAPS capsule Take 1 capsule (0.4 mg total) by mouth daily. 90 capsule 3  . TURMERIC PO Take 1,200 mg by mouth daily.    . vitamin B-12 (CYANOCOBALAMIN) 500 MCG tablet Take 1,000 mcg by mouth daily.    Alveda Reasons 20 MG TABS tablet take 1 tablet by mouth daily with supper  90 tablet 1  . hydrochlorothiazide (MICROZIDE) 12.5 MG capsule Take 1 capsule (12.5 mg total) by mouth daily. 30 capsule 3  . sertraline (ZOLOFT) 50 MG tablet Take 1 tablet (50 mg total) by mouth daily. (Patient not taking: Reported on 04/11/2020) 30 tablet 5   No current facility-administered medications for this visit.     Objective:  BP 138/76  Pulse 72   Temp 98.5 F (36.9 C) (Temporal)   Ht 5\' 11"  (1.803 m)   Wt 190 lb (86.2 kg)   SpO2 96%   BMI 26.50 kg/m  Gen: NAD, resting comfortably Scant cerumen in left ear-easily removed with curette.  Impacted cerumen on the right ear-attempted to curette and removed approximately 75% of cerumen-remaining cerumen attempted to be removed with irrigation    Assessment and Plan   # Cerumen impaction on the right  S:Went to have hearing test and was not able to preform due to wax.   Obviously having hearing issues.   A/P: Cerumen impaction with complete obstruction of tympanic membrane on the right-after curetting was able to remove vast majority of wax but there was still some close to the tympanic  membrane-attempted irrigation and had trouble-we will refer to ENT-multiple attempts were made and cannot remove cerumen  Had less significant cerumen volume in the left ear but this was curetted out.  #Fatigue/depressed mood/snoring S: Patient was started on low-dose Zoloft in December for PHQ-9 of 7.  We later increased to 50 mg.  Patient reported significant sleepiness while on medication-found himself taking prolonged naps and sleeping most of the day and also sleeping at night.  Patient in the past had tried Lexapro but that caused fatigue as well.  We slowly titrated patient off medication and he has been off for a few days-feels like sleepiness has slightly improved.  He does snore a fair amount and has upcoming sleep study in early May-I believe on the seventh A/P: Patient's fatigue seems to be improving off of Zoloft.  His mood has also improved somewhat but they think that may be more to the fact that he has been able to get out of the house more due to having COVID-19 vaccination.  We will plan on getting repeat PHQ-9 at follow-up.  I asked him to let me know how his fatigue does over the next few weeks-they are getting to me a report after sleep study on May 7-I wonder sleep apnea may have been an underlying cause for all these issues   Recommended follow up: as needed for acute concern. Otherwise at least every 3-6 months  Future Appointments  Date Time Provider [place]  04/28/2020 11:30 AM Chesley Mires, MD [facility] None  07/07/2020 11:30 AM Cameron Sprang, MD [facility] None    Lab/Order associations:   ICD-10-CM   1. Impacted cerumen, right ear  H61.21   2. Fatigue, unspecified type  R53.83   3. Snoring  R06.83    Time Spent: 20 minutes of total time (1:14 PM- 1:34 PM) was spent on the date of the encounter performing the following actions: chart review prior to seeing the patient, obtaining history, performing a medically necessary exam, counseling on the treatment  plan, placing orders, and documenting in our EHR.   Return precautions advised.  Garret Reddish, MD

## 2020-04-11 NOTE — Patient Instructions (Addendum)
Update me after sleep study with how fatigue is doing and if they think sleep apnea could be contributing.  Lets stay off Zoloft for now  If you do not hear by the end of the week about referral to ear nose and throat, call us on Monday

## 2020-04-11 NOTE — Progress Notes (Deleted)
Wesley Fuller is a 79 y.o. male here for a new problem.  SCRIBE STATEMENT  History of Present Illness:   No chief complaint on file.   HPI  Past Medical History:  Diagnosis Date  . Allergy    seasonal  . Asthma    as a child  . Calcium oxalate renal stones   . Cataract 2013  . Diverticulosis   . Dysrhythmia    a fib  . ED (erectile dysfunction)   . GERD (gastroesophageal reflux disease)   . History of kidney stones   . Hypertension   . Melanoma of back ([place]) 1974  . Nail fungal infection   . Osteoarthritis of left knee 03/02/2014     Social History   Socioeconomic History  . Marital status: Married    Spouse name: Not on file  . Number of children: 2  . Years of education: Not on file  . Highest education level: Not on file  Occupational History  . Occupation: Retired  Tobacco Use  . Smoking status: Never Smoker  . Smokeless tobacco: Never Used  Substance and Sexual Activity  . Alcohol use: Yes    Alcohol/week: 5.0 standard drinks    Types: 5 Glasses of wine per week    Comment: does not drink very much; glass of wine every day  . Drug use: No  . Sexual activity: Yes    Partners: Female  Other Topics Concern  . Not on file  Social History Narrative   Married (wife Wesley Fuller) 49. 2 sons- 1 in [place]. 3 grandsons.       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 Difficulty of Paying Living Expenses:   Food Insecurity:   . Worried About Charity fundraiser in the Last Year:   . Arboriculturist in the Last Year:   Transportation Needs:   . Film/video editor (Medical):   Marland Kitchen Lack of Transportation (Non-Medical):   Physical Activity:   . Days of Exercise per Week:   . Minutes of Exercise per Session:   Stress:   . Feeling of Stress :   Social Connections:   . Frequency of Communication with Friends  and Family:   . Frequency of Social Gatherings with Friends and Family:   . Attends Religious Services:   . Active Member of Clubs or Organizations:   . Attends Archivist Meetings:   Marland Kitchen Marital Status:   Intimate Partner Violence:   . Fear of Current or Ex-Partner:   . Emotionally Abused:   Marland Kitchen Physically Abused:   . Sexually Abused:     Past Surgical History:  Procedure Laterality Date  . EYE SURGERY     cataract, glaucoma surgery  . HERNIA REPAIR     x 2  . left leg stripping     varicose vein  . lymph node disection  1981  . melanoma removal of back   1974  . NASAL POLYP SURGERY    . skin cancer    . TONSILLECTOMY    . TOTAL KNEE ARTHROPLASTY Left 06/07/2019   Procedure: TOTAL KNEE ARTHROPLASTY;  Surgeon: Gaynelle Arabian, MD;  Location: [facility];  Service: Orthopedics;  Laterality: Left;  44min  . TOTAL KNEE ARTHROPLASTY Left     Family History  Problem Relation Age of Onset  .  Colon cancer Father   . Colon cancer Paternal Uncle   . Colon cancer Paternal Uncle   . Hypertension Other     No Known Allergies  Current Medications:   Current Outpatient Medications:  .  Astaxanthin 4 MG CAPS, Take 4 mg by mouth at bedtime. , Disp: , Rfl:  .  atorvastatin (LIPITOR) 10 MG tablet, Take 1 tablet (10 mg total) by mouth daily., Disp: 90 tablet, Rfl: 3 .  CALCIUM PO, Take 1 tablet by mouth daily., Disp: , Rfl:  .  cholecalciferol (VITAMIN D3) 25 MCG (1000 UT) tablet, Take 1,000 Units by mouth daily., Disp: , Rfl:  .  donepezil (ARICEPT) 10 MG tablet, Take 1 tablet (10 mg total) by mouth at bedtime., Disp: 90 tablet, Rfl: 3 .  hydrochlorothiazide (MICROZIDE) 12.5 MG capsule, Take 1 capsule (12.5 mg total) by mouth daily., Disp: 30 capsule, Rfl: 3 .  irbesartan (AVAPRO) 300 MG tablet, Take 1 tablet (300 mg total) by mouth daily., Disp: 90 tablet, Rfl: 3 .  Melatonin 5 MG TABS, Take 5 mg by mouth at bedtime., Disp: , Rfl:  .  memantine (NAMENDA) 10 MG tablet, Take 1 tablet  every night for 2 weeks, then increase to 1 tablet twice a day, Disp: 60 tablet, Rfl: 11 .  mirabegron ER (MYRBETRIQ) 25 MG TB24 tablet, Take 25 mg by mouth daily., Disp: , Rfl:  .  sertraline (ZOLOFT) 50 MG tablet, Take 1 tablet (50 mg total) by mouth daily., Disp: 30 tablet, Rfl: 5 .  tamsulosin (FLOMAX) 0.4 MG CAPS capsule, Take 1 capsule (0.4 mg total) by mouth daily., Disp: 90 capsule, Rfl: 3 .  TURMERIC PO, Take 1,200 mg by mouth daily., Disp: , Rfl:  .  vitamin B-12 (CYANOCOBALAMIN) 500 MCG tablet, Take 1,000 mcg by mouth daily., Disp: , Rfl:  .  XARELTO 20 MG TABS tablet, take 1 tablet by mouth daily with supper , Disp: 90 tablet, Rfl: 1   Review of Systems:   ROS  Vitals:   There were no vitals filed for this visit.   There is no height or weight on file to calculate BMI.  Physical Exam:   Physical Exam  Results for orders placed or performed in visit on 01/10/20  Lipid Profile  Result Value Ref Range   Cholesterol, Total 155 100 - 199 mg/dL   Triglycerides 78 0 - 149 mg/dL   HDL 70 >39 mg/dL   VLDL Cholesterol Cal 15 5 - 40 mg/dL   LDL Chol Calc (NIH) 70 0 - 99 mg/dL   Chol/HDL Ratio 2.2 0.0 - 5.0 ratio  Hepatic function panel  Result Value Ref Range   Total Protein 6.0 6.0 - 8.5 g/dL   Albumin 3.8 3.7 - 4.7 g/dL   Bilirubin Total 0.9 0.0 - 1.2 mg/dL   Bilirubin, Direct 0.23 0.00 - 0.40 mg/dL   Alkaline Phosphatase 90 39 - 117 IU/L   AST 22 0 - 40 IU/L   ALT 20 0 - 44 IU/L    Assessment and Plan:   There are no diagnoses linked to this encounter.  . Reviewed expectations re: course of current medical issues. . Discussed self-management of symptoms. . Outlined signs and symptoms indicating need for more acute intervention. . Patient verbalized understanding and all questions were answered. . See orders for this visit as documented in the electronic medical record. . Patient received an After-Visit Summary.  ***  Inda Coke, PA-C

## 2020-04-14 DIAGNOSIS — H905 Unspecified sensorineural hearing loss: Secondary | ICD-10-CM | POA: Diagnosis not present

## 2020-04-14 DIAGNOSIS — H6121 Impacted cerumen, right ear: Secondary | ICD-10-CM | POA: Diagnosis not present

## 2020-04-14 DIAGNOSIS — Z974 Presence of external hearing-aid: Secondary | ICD-10-CM | POA: Diagnosis not present

## 2020-04-14 DIAGNOSIS — J343 Hypertrophy of nasal turbinates: Secondary | ICD-10-CM | POA: Diagnosis not present

## 2020-04-17 ENCOUNTER — Other Ambulatory Visit: Payer: Self-pay | Admitting: Cardiology

## 2020-04-17 NOTE — Telephone Encounter (Signed)
Rx refill sent to pharmacy. 

## 2020-04-18 DIAGNOSIS — D2262 Melanocytic nevi of left upper limb, including shoulder: Secondary | ICD-10-CM | POA: Diagnosis not present

## 2020-04-18 DIAGNOSIS — D485 Neoplasm of uncertain behavior of skin: Secondary | ICD-10-CM | POA: Diagnosis not present

## 2020-04-18 DIAGNOSIS — Z1283 Encounter for screening for malignant neoplasm of skin: Secondary | ICD-10-CM | POA: Diagnosis not present

## 2020-04-18 DIAGNOSIS — Z08 Encounter for follow-up examination after completed treatment for malignant neoplasm: Secondary | ICD-10-CM | POA: Diagnosis not present

## 2020-04-18 DIAGNOSIS — L905 Scar conditions and fibrosis of skin: Secondary | ICD-10-CM | POA: Diagnosis not present

## 2020-04-18 DIAGNOSIS — D2261 Melanocytic nevi of right upper limb, including shoulder: Secondary | ICD-10-CM | POA: Diagnosis not present

## 2020-04-18 DIAGNOSIS — T148XXA Other injury of unspecified body region, initial encounter: Secondary | ICD-10-CM | POA: Diagnosis not present

## 2020-04-18 DIAGNOSIS — Z85820 Personal history of malignant melanoma of skin: Secondary | ICD-10-CM | POA: Diagnosis not present

## 2020-04-18 DIAGNOSIS — D225 Melanocytic nevi of trunk: Secondary | ICD-10-CM | POA: Diagnosis not present

## 2020-04-28 ENCOUNTER — Other Ambulatory Visit: Payer: Self-pay

## 2020-04-28 ENCOUNTER — Ambulatory Visit: Payer: Medicare HMO | Admitting: Pulmonary Disease

## 2020-04-28 ENCOUNTER — Encounter: Payer: Self-pay | Admitting: Pulmonary Disease

## 2020-04-28 VITALS — BP 124/82 | HR 90 | Temp 97.7°F | Ht 72.0 in | Wt 191.2 lb

## 2020-04-28 DIAGNOSIS — R0683 Snoring: Secondary | ICD-10-CM | POA: Diagnosis not present

## 2020-04-28 NOTE — Progress Notes (Signed)
[facility]  Chief Complaint  Patient presents with   Consult    pt states sleeping long hours and falls asleep quickly. pt snoring loudly.    Constitutional:  BP 124/82 (BP Location: Left Arm, Cuff Size: Normal)    Pulse 90    Temp 97.7 F (36.5 C) (Temporal)    Ht 6' (1.829 m)    Wt 191 lb 3.2 oz (86.7 kg)    SpO2 96%    BMI 25.93 kg/m   Past Medical History:  A fib, Allergies, Asthma, Nephrolithiasis, Cataract, Diverticulosis, ED, GERD, HTN, Melanoma, OA, Depression, Memory loss  Summary:  Wesley Fuller is a 79 y.o. male with snoring.  Subjective:   He is here with his wife.  She has been concerned about his snoring.  This has been getting worse for years.  She can no longer sleep in the same room as him.  She has also heard him snort when asleep.  He is always sleepy, and fall asleep whenever he is sitting quiet.  He probably sleeps for 11 hours in a day.  He goes to sleep at 10 pm.  He falls asleep immediately.  He wakes up several times to use the bathroom.  He gets out of bed at 11 am.  He feels tired in the morning.  He denies morning headache.  He does not use anything to help him fall sleep or stay awake.  He denies sleep walking, sleep talking, bruxism, or nightmares.  There is no history of restless legs.  He denies sleep hallucinations, sleep paralysis, or cataplexy.  The Epworth score is 13 out of 24.     Physical Exam:   Appearance - well kempt  ENMT - no sinus tenderness, no nasal discharge, no oral exudate, Mallampati 4, narrow jaw, overbite  Respiratory - no wheeze, or rales  CV - regular rate and rhythm, no murmurs  GI - soft, non tender  Lymph - no adenopathy noted in neck  Ext - no edema  Skin - no rashes  Neuro - normal strength, oriented x 3  Psych - normal mood and affect  Discussion:  He has snoring, sleep disruption, apnea, daytime sleepiness, memory difficulty and nocturia.  He has history  of A fib.  His brother has sleep apnea and uses a CPAP machine.  I am concerned that he could have obstructive sleep apnea.  Assessment/Plan:   Snoring with excessive daytime sleepiness. - will need to arrange for a home sleep study  Obesity. - discussed how weight can impact sleep and risk for sleep disordered breathing - discussed options to assist with weight loss: combination of diet modification, cardiovascular and strength training exercises  Cardiovascular risk. - had an extensive discussion regarding the adverse health consequences related to untreated sleep disordered breathing - specifically discussed the risks for hypertension, coronary artery disease, cardiac dysrhythmias, cerebrovascular disease, and diabetes - lifestyle modification discussed  Safe driving practices. - discussed how sleep disruption can increase risk of accidents, particularly when driving - safe driving practices were discussed  Therapies for obstructive sleep apnea. - if the sleep study shows significant sleep apnea, then various therapies for treatment were reviewed: CPAP, oral appliance, and surgical interventions     Follow up:  Patient Instructions  Will arrange for home sleep study Will call to arrange for follow up after sleep study reviewed    Signature:  Chesley Mires, MD [place] Pager: [phone] 04/28/2020, 1:42 PM  Flow Sheet     Pulmonary tests:    Sleep tests:    Cardiac tests:  Echo 09/01/18 >> EF 60 to 65%, grade 1 DD, mild AS, mild ascending aorta dilation  Medications:   Allergies as of 04/28/2020   No Known Allergies     Medication List       Accurate as of Apr 28, 2020  1:42 PM. If you have any questions, ask your nurse or doctor.        Astaxanthin 4 MG Caps Take 4 mg by mouth at bedtime.   atorvastatin 10 MG tablet Commonly known as: LIPITOR Take 1 tablet (10 mg total) by mouth daily.   CALCIUM PO Take 1 tablet by mouth  daily.   cholecalciferol 25 MCG (1000 UNIT) tablet Commonly known as: VITAMIN D3 Take 1,000 Units by mouth daily.   donepezil 10 MG tablet Commonly known as: ARICEPT Take 1 tablet (10 mg total) by mouth at bedtime.   hydrochlorothiazide 12.5 MG capsule Commonly known as: MICROZIDE TAKE ONE CAPSULE BY MOUTH ONE TIME DAILY   irbesartan 300 MG tablet Commonly known as: AVAPRO Take 1 tablet (300 mg total) by mouth daily.   melatonin 5 MG Tabs Take 5 mg by mouth at bedtime.   memantine 10 MG tablet Commonly known as: NAMENDA Take 1 tablet every night for 2 weeks, then increase to 1 tablet twice a day   Myrbetriq 25 MG Tb24 tablet Generic drug: mirabegron ER Take 25 mg by mouth daily.   sertraline 50 MG tablet Commonly known as: ZOLOFT Take 1 tablet (50 mg total) by mouth daily.   tamsulosin 0.4 MG Caps capsule Commonly known as: FLOMAX Take 1 capsule (0.4 mg total) by mouth daily.   TURMERIC PO Take 1,200 mg by mouth daily.   vitamin B-12 500 MCG tablet Commonly known as: CYANOCOBALAMIN Take 1,000 mcg by mouth daily.   Xarelto 20 MG Tabs tablet Generic drug: rivaroxaban take 1 tablet by mouth daily with supper       Past Surgical History:  He  has a past surgical history that includes skin cancer; lymph node disection (1981); melanoma removal of back  (1974); left leg stripping; Hernia repair; Nasal polyp surgery; Eye surgery; Tonsillectomy; Total knee arthroplasty (Left, 06/07/2019); and Total knee arthroplasty (Left).  Family History:  His family history includes Colon cancer in his father, paternal uncle, and paternal uncle; Hypertension in an other family member.  Social History:  He  reports that he has never smoked. He has never used smokeless tobacco. He reports current alcohol use of about 5.0 standard drinks of alcohol per week. He reports that he does not use drugs.

## 2020-04-28 NOTE — Patient Instructions (Signed)
Will arrange for home sleep study Will call to arrange for follow up after sleep study reviewed  

## 2020-05-02 DIAGNOSIS — X32XXXD Exposure to sunlight, subsequent encounter: Secondary | ICD-10-CM | POA: Diagnosis not present

## 2020-05-02 DIAGNOSIS — D485 Neoplasm of uncertain behavior of skin: Secondary | ICD-10-CM | POA: Diagnosis not present

## 2020-05-02 DIAGNOSIS — L570 Actinic keratosis: Secondary | ICD-10-CM | POA: Diagnosis not present

## 2020-05-02 DIAGNOSIS — L988 Other specified disorders of the skin and subcutaneous tissue: Secondary | ICD-10-CM | POA: Diagnosis not present

## 2020-05-12 ENCOUNTER — Encounter: Payer: Self-pay | Admitting: Family Medicine

## 2020-05-12 NOTE — Telephone Encounter (Signed)
Will it be okay to schedule SDA for appt ?

## 2020-05-13 NOTE — Telephone Encounter (Signed)
Called and spoke to Wesley Fuller told her I saw My Chart message and wanted to offer and appt this morning virtually with Dr. Birdie Riddle [facility]. Wesley Fuller declined appt said they were waiting to hear from Dr. Ronney Lion nurse about adding pt to the schedule. Told her okay.

## 2020-05-15 ENCOUNTER — Telehealth: Payer: Self-pay

## 2020-05-15 ENCOUNTER — Other Ambulatory Visit: Payer: Self-pay | Admitting: Cardiology

## 2020-05-15 NOTE — Telephone Encounter (Signed)
Tomorrow 8:20 AM should be fine-I will come in before the meeting

## 2020-05-15 NOTE — Patient Instructions (Addendum)
There are no preventive care reminders to display for this patient.

## 2020-05-15 NOTE — Telephone Encounter (Signed)
Per Colletta [place]: pt wife called and said pt is tired, sleeping a lot, body aches - no URI symptoms - just feeling "rotten" per wife.  i have him scheduled for Thursday.  let me know if something else opens up today or tomorrow (Wed is not a good day for them).  and i scheduled that in office - i can change it if he would rather it be virtual.  i just figured he would want to do labs.  Butch Penny also offered them to go to Saturday [facility] (see phone note from 05/12/20).

## 2020-05-15 NOTE — Progress Notes (Deleted)
Duplicate note

## 2020-05-15 NOTE — Telephone Encounter (Signed)
See below Steph, ok to move appointment from Thursday to tomorrow.

## 2020-05-16 ENCOUNTER — Other Ambulatory Visit: Payer: Self-pay

## 2020-05-16 ENCOUNTER — Ambulatory Visit: Payer: Medicare Other

## 2020-05-16 ENCOUNTER — Ambulatory Visit: Payer: Medicare Other | Admitting: Family Medicine

## 2020-05-16 DIAGNOSIS — R0683 Snoring: Secondary | ICD-10-CM

## 2020-05-16 DIAGNOSIS — G4733 Obstructive sleep apnea (adult) (pediatric): Secondary | ICD-10-CM | POA: Diagnosis not present

## 2020-05-18 ENCOUNTER — Ambulatory Visit (INDEPENDENT_AMBULATORY_CARE_PROVIDER_SITE_OTHER): Payer: Medicare Other | Admitting: Family Medicine

## 2020-05-18 ENCOUNTER — Other Ambulatory Visit: Payer: Self-pay

## 2020-05-18 ENCOUNTER — Encounter: Payer: Self-pay | Admitting: Family Medicine

## 2020-05-18 ENCOUNTER — Ambulatory Visit: Payer: Medicare Other | Admitting: Family Medicine

## 2020-05-18 VITALS — BP 118/80 | HR 71 | Temp 98.4°F | Ht 72.0 in | Wt 188.0 lb

## 2020-05-18 DIAGNOSIS — R52 Pain, unspecified: Secondary | ICD-10-CM | POA: Diagnosis not present

## 2020-05-18 DIAGNOSIS — R5383 Other fatigue: Secondary | ICD-10-CM | POA: Diagnosis not present

## 2020-05-18 DIAGNOSIS — F320 Major depressive disorder, single episode, mild: Secondary | ICD-10-CM

## 2020-05-18 LAB — POC URINALSYSI DIPSTICK (AUTOMATED)
Bilirubin, UA: NEGATIVE
Blood, UA: NEGATIVE
Glucose, UA: NEGATIVE
Ketones, UA: NEGATIVE
Leukocytes, UA: NEGATIVE
Nitrite, UA: NEGATIVE
Protein, UA: NEGATIVE
Spec Grav, UA: 1.025 (ref 1.010–1.025)
Urobilinogen, UA: 0.2 E.U./dL
pH, UA: 6 (ref 5.0–8.0)

## 2020-05-18 MED ORDER — BUPROPION HCL ER (XL) 150 MG PO TB24
150.0000 mg | ORAL_TABLET | Freq: Every day | ORAL | 5 refills | Status: DC
Start: 2020-05-18 — End: 2020-11-10

## 2020-05-18 NOTE — Progress Notes (Signed)
Phone [phone] In person visit   Subjective:   Wesley Fuller is a 79 y.o. year old very pleasant male patient who presents for/with See problem oriented charting Chief Complaint  Patient presents with  . Fatigue   This visit occurred during the SARS-CoV-2 public health emergency.  Safety protocols were in place, including screening questions prior to the visit, additional usage of staff PPE, and extensive cleaning of exam room while observing appropriate contact time as indicated for disinfecting solutions.   Past Medical History-  Patient Active Problem List   Diagnosis Date Noted  . CAD (coronary artery disease) 01/13/2020    Priority: High  . Paroxysmal atrial fibrillation ([place]) 08/05/2018    Priority: High  . Mild dementia ([place]) 08/22/2015    Priority: High  . BPH associated with nocturia 02/22/2019    Priority: Medium  . Depression, major, single episode, mild ([place]) 06/01/2018    Priority: Medium  . Hyperglycemia 10/15/2016    Priority: Medium  . Essential hypertension 02/09/2008    Priority: Medium  . History of melanoma 02/09/2008    Priority: Medium  . Pedal edema 12/13/2019    Priority: Low  . Osteoarthritis of left knee 06/07/2019    Priority: Low  . Asymmetric SNHL (sensorineural hearing loss) 04/11/2016    Priority: Low  . OA (osteoarthritis) of knee 03/02/2014    Priority: Low  . VARICOSE VEINS LOWER EXTREMITIES W/INFLAMMATION 02/20/2010    Priority: Low  . GERD 11/21/2008    Priority: Low  . Actinic keratosis 10/12/2008    Priority: Low  . ERECTILE DYSFUNCTION 02/09/2008    Priority: Low  . RENAL CALCULUS, HX OF 02/09/2008    Priority: Low    Medications- reviewed and updated Current Outpatient Medications  Medication Sig Dispense Refill  . Astaxanthin 4 MG CAPS Take 4 mg by mouth at bedtime.     Marland Kitchen atorvastatin (LIPITOR) 10 MG tablet Take 1 tablet (10 mg total) by mouth daily. 90 tablet 3  . CALCIUM PO Take 1 tablet by mouth daily.    .  cholecalciferol (VITAMIN D3) 25 MCG (1000 UT) tablet Take 1,000 Units by mouth daily.    Marland Kitchen donepezil (ARICEPT) 10 MG tablet Take 1 tablet (10 mg total) by mouth at bedtime. 90 tablet 3  . hydrochlorothiazide (MICROZIDE) 12.5 MG capsule TAKE ONE CAPSULE BY MOUTH ONE TIME DAILY  30 capsule 0  . irbesartan (AVAPRO) 300 MG tablet Take 1 tablet (300 mg total) by mouth daily. 90 tablet 3  . Melatonin 5 MG TABS Take 5 mg by mouth at bedtime.    . memantine (NAMENDA) 10 MG tablet Take 1 tablet every night for 2 weeks, then increase to 1 tablet twice a day 60 tablet 11  . mirabegron ER (MYRBETRIQ) 25 MG TB24 tablet Take 25 mg by mouth daily.    . sertraline (ZOLOFT) 50 MG tablet Take 1 tablet (50 mg total) by mouth daily. (Patient not taking: Reported on 04/11/2020) 30 tablet 5  . tamsulosin (FLOMAX) 0.4 MG CAPS capsule Take 1 capsule (0.4 mg total) by mouth daily. 90 capsule 3  . TURMERIC PO Take 1,200 mg by mouth daily.    . vitamin B-12 (CYANOCOBALAMIN) 500 MCG tablet Take 1,000 mcg by mouth daily.    Alveda Reasons 20 MG TABS tablet take 1 tablet by mouth daily with supper  90 tablet 1   No current facility-administered medications for this visit.     Objective:  BP 118/80   Pulse  71   Temp 98.4 F (36.9 C) (Temporal)   Ht 6' (1.829 m)   Wt 188 lb (85.3 kg)   SpO2 95%   BMI 25.50 kg/m  Gen: NAD, resting comfortably CV: RRR no murmurs rubs or gallops Lungs: CTAB no crackles, wheeze, rhonchi Ext: Trace edema Skin: warm, dry    Results for orders placed or performed in visit on 05/18/20 (from the past 24 hour(s))  POCT Urinalysis Dipstick (Automated)     Status: None   Collection Time: 05/18/20  1:52 PM  Result Value Ref Range   Color, UA yellow    Clarity, UA clear    Glucose, UA Negative Negative   Bilirubin, UA neg    Ketones, UA neg    Spec Grav, UA 1.025 1.010 - 1.025   Blood, UA neg    pH, UA 6.0 5.0 - 8.0   Protein, UA Negative Negative   Urobilinogen, UA 0.2 0.2 or 1.0 E.U./dL    Nitrite, UA neg    Leukocytes, UA Negative Negative       Assessment and Plan  Fatigue/Sleeping A lot/Body aches S:symptoms started gradually over the last few months. He did have sleep study done a few nights ago but has not received results-I do not see results yet in epic. He is sleeping on average 12 hours a night with frequent naps during the day.   Body Aches: started within the last 6 months. Has generalized but noticed more in the back. Has been working with trainer but has not noticed much improvement-particular the right low back. Patient is on atorvastatin 10 mg daily which can be associated with myalgias in retrospect he started this about 4 to 5 months ago and wife wonders if this lines up with when his symptoms started  We have previously tried him on antidepressants to see if that would help without improvement.  He has stopped his Zoloft due to concern that fatigue was actually worsening  LFTs normal last check, BMP normal in January. No diabetes based off A1c this year. Last TSH was 2019.  Does have cardiologist for atrial fibrillation-Dr. [name]. Does have coronary CT scoring of 85th percentile but already on statin and aspirin-no catheterization has been suggested by cardiology.  No chest pain or shortness of breath reported. A/P: At least for the body aches portion we would like to trial off statin for 3 weeks to see if this makes a difference.  We discussed if this does work possibly retrying a different dose such as rosuvastatin 5 mg and see if symptoms recur  As far as his fatigue-I am suspicious sleep apnea may be present-we are still awaiting OSA testing results from home study. -Depression could be contributing-see plan below -At the time of this visit our phlebotomist was not available but we did discuss checking a TSH which has not recently been checked if he fails to improve -We did obtain a urinalysis given right low back pain-no evidence of UTI-I think a  culture would be lower yield -Also could consider referral to sports medicine for low back pain if does not improve off statin  # Depression S: Medication: None Depression screen [facility] 2/9 05/18/2020 02/10/2020 12/14/2019  Decreased Interest 2 1 1   Down, Depressed, Hopeless 3 0 1  PHQ - 2 Score 5 1 2   Altered sleeping 3 0 2  Tired, decreased energy 3 3 0  Change in appetite 0 0 0  Feeling bad or failure about yourself  0 0 0  Trouble concentrating 3 3 3   Moving slowly or fidgety/restless 0 0 0  Suicidal thoughts 0 0 0  PHQ-9 Score 14 7 7   Difficult doing work/chores Not difficult at all Not difficult at all Not difficult at all  Some recent data might be hidden  A/P: Depression appears poorly controlled with PHQ-9 up to 14-we would like to trial Wellbutrin but he is going to wait until after the 3-week trial off atorvastatin so we only make one adjustment at a time  Patient also has significant stressor of potentially moving to [place] and out of his home of the last 40 years.  This is really hard on him and his wife-they are going down on Tuesday to look at a unit in a retirement facility that came open and transition may happen very quickly. -Spent time counseling patient about this transition  Obviously issues with dementia are a stressor for him as well  Recommended follow up: As needed for new or worsening symptoms-he may be transitioning to [place] to live and would need a new PCP there locally Future Appointments  Date Time Provider Van  07/07/2020 11:30 AM Cameron Sprang, MD [facility] None    Lab/Order associations:   ICD-10-CM   1. Fatigue, unspecified type  R53.83 POCT Urinalysis Dipstick (Automated)  2. Body aches  R52   3. Depression, major, single episode, mild ([place])  F32.0     Meds ordered this encounter  Medications  . buPROPion (WELLBUTRIN XL) 150 MG 24 hr tablet    Sig: Take 1 tablet (150 mg total) by mouth daily.    Dispense:  30 tablet     Refill:  5   Time Spent: 43 minutes of total time (11:54 AM- 12:25 AM, 8:03-8:15 pm) was spent on the date of the encounter performing the following actions: chart review prior to seeing the patient, obtaining history, performing a medically necessary exam, counseling on the treatment plan, placing orders, and documenting in our EHR.   Return precautions advised.  Garret Reddish, MD

## 2020-05-26 ENCOUNTER — Telehealth: Payer: Self-pay | Admitting: Pulmonary Disease

## 2020-05-26 DIAGNOSIS — G4733 Obstructive sleep apnea (adult) (pediatric): Secondary | ICD-10-CM | POA: Diagnosis not present

## 2020-05-26 NOTE — Telephone Encounter (Signed)
HST 05/16/20 >> AHI 13.7, SpO2 low 80%   Please inform him that his sleep study shows mild obstructive sleep apnea.  Please arrange for ROV with me or NP to discuss treatment options.

## 2020-05-26 NOTE — Telephone Encounter (Signed)
ATC patient. Someone answered the phone but did not say anything. Will call back later.

## 2020-06-02 DIAGNOSIS — C44319 Basal cell carcinoma of skin of other parts of face: Secondary | ICD-10-CM | POA: Diagnosis not present

## 2020-06-02 DIAGNOSIS — Z85828 Personal history of other malignant neoplasm of skin: Secondary | ICD-10-CM | POA: Diagnosis not present

## 2020-06-02 DIAGNOSIS — Z08 Encounter for follow-up examination after completed treatment for malignant neoplasm: Secondary | ICD-10-CM | POA: Diagnosis not present

## 2020-06-02 DIAGNOSIS — D485 Neoplasm of uncertain behavior of skin: Secondary | ICD-10-CM | POA: Diagnosis not present

## 2020-06-05 NOTE — Telephone Encounter (Signed)
Left message for patient to call.

## 2020-06-06 ENCOUNTER — Encounter: Payer: Self-pay | Admitting: Family Medicine

## 2020-06-12 ENCOUNTER — Encounter: Payer: Self-pay | Admitting: Family Medicine

## 2020-06-23 ENCOUNTER — Telehealth: Payer: Self-pay | Admitting: Pulmonary Disease

## 2020-06-23 NOTE — Telephone Encounter (Signed)
lmom to call back . Chesley Mires, MD     1:32 PM Note HST 05/16/20 >> AHI 13.7, SpO2 low 80%   Please inform him that his sleep study shows mild obstructive sleep apnea.  Please arrange for ROV with me or NP to discuss treatment options.

## 2020-06-27 NOTE — Telephone Encounter (Signed)
Pt wife returning missed call. Can be reached at [phone]

## 2020-06-27 NOTE — Telephone Encounter (Signed)
Spoke with wife, Korie Brabson, listed on DPR regarding sleep study results.  Provided results per Dr. Halford Chessman.  She had already scheduled a f/u visit prior to my call.  Nothing further needed.

## 2020-07-04 ENCOUNTER — Ambulatory Visit: Payer: Medicare Other | Admitting: Primary Care

## 2020-07-04 ENCOUNTER — Other Ambulatory Visit: Payer: Self-pay

## 2020-07-04 ENCOUNTER — Encounter: Payer: Self-pay | Admitting: Primary Care

## 2020-07-04 DIAGNOSIS — G4733 Obstructive sleep apnea (adult) (pediatric): Secondary | ICD-10-CM | POA: Diagnosis not present

## 2020-07-04 NOTE — Patient Instructions (Addendum)
Testing: Home sleep test showed mild obstruction sleep apnea You had 13.7 events/hours, O2 low was 80%  (average O2 was 92%)  Treatment options for obstructive sleep apnea include weight loss, wedge pillow or side sleeping position, oral appliance, CPAP therapy or referral to ear nose and throat   Untreated sleep apnea can increase your risk for underlying cardiovascular disease  Recommendations: If you would like to start CPAP therapy please notify our office and we can send an order to a DME company closer to where he will be moving.  We would need to follow-up with you virtually in 1 to 3 months after starting to review compliance report and address any issues that you may have regarding CPAP  Follow-up: As needed with our office   Sleep Apnea Sleep apnea is a condition in which breathing pauses or becomes shallow during sleep. Episodes of sleep apnea usually last 10 seconds or longer, and they may occur as many as 20 times an hour. Sleep apnea disrupts your sleep and keeps your body from getting the rest that it needs. This condition can increase your risk of certain health problems, including:  Heart attack.  Stroke.  Obesity.  Diabetes.  Heart failure.  Irregular heartbeat. What are the causes? There are three kinds of sleep apnea:  Obstructive sleep apnea. This kind is caused by a blocked or collapsed airway.  Central sleep apnea. This kind happens when the part of the brain that controls breathing does not send the correct signals to the muscles that control breathing.  Mixed sleep apnea. This is a combination of obstructive and central sleep apnea. The most common cause of this condition is a collapsed or blocked airway. An airway can collapse or become blocked if:  Your throat muscles are abnormally relaxed.  Your tongue and tonsils are larger than normal.  You are overweight.  Your airway is smaller than normal. What increases the risk? You are more likely to  develop this condition if you:  Are overweight.  Smoke.  Have a smaller than normal airway.  Are elderly.  Are male.  Drink alcohol.  Take sedatives or tranquilizers.  Have a family history of sleep apnea. What are the signs or symptoms? Symptoms of this condition include:  Trouble staying asleep.  Daytime sleepiness and tiredness.  Irritability.  Loud snoring.  Morning headaches.  Trouble concentrating.  Forgetfulness.  Decreased interest in sex.  Unexplained sleepiness.  Mood swings.  Personality changes.  Feelings of depression.  Waking up often during the night to urinate.  Dry mouth.  Sore throat. How is this diagnosed? This condition may be diagnosed with:  A medical history.  A physical exam.  A series of tests that are done while you are sleeping (sleep study). These tests are usually done in a sleep lab, but they may also be done at home. How is this treated? Treatment for this condition aims to restore normal breathing and to ease symptoms during sleep. It may involve managing health issues that can affect breathing, such as high blood pressure or obesity. Treatment may include:  Sleeping on your side.  Using a decongestant if you have nasal congestion.  Avoiding the use of depressants, including alcohol, sedatives, and narcotics.  Losing weight if you are overweight.  Making changes to your diet.  Quitting smoking.  Using a device to open your airway while you sleep, such as: ? An oral appliance. This is a custom-made mouthpiece that shifts your lower jaw forward. ? A  continuous positive airway pressure (CPAP) device. This device blows air through a mask when you breathe out (exhale). ? A nasal expiratory positive airway pressure (EPAP) device. This device has valves that you put into each nostril. ? A bi-level positive airway pressure (BPAP) device. This device blows air through a mask when you breathe in (inhale) and breathe out  (exhale).  Having surgery if other treatments do not work. During surgery, excess tissue is removed to create a wider airway. It is important to get treatment for sleep apnea. Without treatment, this condition can lead to:  High blood pressure.  Coronary artery disease.  In men, an inability to achieve or maintain an erection (impotence).  Reduced thinking abilities. Follow these instructions at home: Lifestyle  Make any lifestyle changes that your health care provider recommends.  Eat a healthy, well-balanced diet.  Take steps to lose weight if you are overweight.  Avoid using depressants, including alcohol, sedatives, and narcotics.  Do not use any products that contain nicotine or tobacco, such as cigarettes, e-cigarettes, and chewing tobacco. If you need help quitting, ask your health care provider. General instructions  Take over-the-counter and prescription medicines only as told by your health care provider.  If you were given a device to open your airway while you sleep, use it only as told by your health care provider.  If you are having surgery, make sure to tell your health care provider you have sleep apnea. You may need to bring your device with you.  Keep all follow-up visits as told by your health care provider. This is important. Contact a health care provider if:  The device that you received to open your airway during sleep is uncomfortable or does not seem to be working.  Your symptoms do not improve.  Your symptoms get worse. Get help right away if:  You develop: ? Chest pain. ? Shortness of breath. ? Discomfort in your back, arms, or stomach.  You have: ? Trouble speaking. ? Weakness on one side of your body. ? Drooping in your face. These symptoms may represent a serious problem that is an emergency. Do not wait to see if the symptoms will go away. Get medical help right away. Call your local emergency services (911 in the [place] Do not drive  yourself to the hospital. Summary  Sleep apnea is a condition in which breathing pauses or becomes shallow during sleep.  The most common cause is a collapsed or blocked airway.  The goal of treatment is to restore normal breathing and to ease symptoms during sleep. This information is not intended to replace advice given to you by your health care provider. Make sure you discuss any questions you have with your health care provider. Document Revised: 05/26/2019 Document Reviewed: 08/04/2018 Elsevier Patient Education  [place].   CPAP and BPAP Information CPAP and BPAP are methods of helping a person breathe with the use of air pressure. CPAP stands for "continuous positive airway pressure." BPAP stands for "bi-level positive airway pressure." In both methods, air is blown through your nose or mouth and into your air passages to help you breathe well. CPAP and BPAP use different amounts of pressure to blow air. With CPAP, the amount of pressure stays the same while you breathe in and out. With BPAP, the amount of pressure is increased when you breathe in (inhale) so that you can take larger breaths. Your health care provider will recommend whether CPAP or BPAP would be more helpful  for you. Why are CPAP and BPAP treatments used? CPAP or BPAP can be helpful if you have:  Sleep apnea.  Chronic obstructive pulmonary disease (COPD).  Heart failure.  Medical conditions that weaken the muscles of the chest including muscular dystrophy, or neurological diseases such as amyotrophic lateral sclerosis (ALS).  Other problems that cause breathing to be weak, abnormal, or difficult. CPAP is most commonly used for obstructive sleep apnea (OSA) to keep the airways from collapsing when the muscles relax during sleep. How is CPAP or BPAP administered? Both CPAP and BPAP are provided by a small machine with a flexible plastic tube that attaches to a plastic mask. You wear the mask. Air is  blown through the mask into your nose or mouth. The amount of pressure that is used to blow the air can be adjusted on the machine. Your health care provider will determine the pressure setting that should be used based on your individual needs. When should CPAP or BPAP be used? In most cases, the mask only needs to be worn during sleep. Generally, the mask needs to be worn throughout the night and during any daytime naps. People with certain medical conditions may also need to wear the mask at other times when they are awake. Follow instructions from your health care provider about when to use the machine. What are some tips for using the mask?   Because the mask needs to be snug, some people feel trapped or closed-in (claustrophobic) when first using the mask. If you feel this way, you may need to get used to the mask. One way to do this is by holding the mask loosely over your nose or mouth and then gradually applying the mask more snugly. You can also gradually increase the amount of time that you use the mask.  Masks are available in various types and sizes. Some fit over your mouth and nose while others fit over just your nose. If your mask does not fit well, talk with your health care provider about getting a different one.  If you are using a mask that fits over your nose and you tend to breathe through your mouth, a chin strap may be applied to help keep your mouth closed.  The CPAP and BPAP machines have alarms that may sound if the mask comes off or develops a leak.  If you have trouble with the mask, it is very important that you talk with your health care provider about finding a way to make the mask easier to tolerate. Do not stop using the mask. Stopping the use of the mask could have a negative impact on your health. What are some tips for using the machine?  Place your CPAP or BPAP machine on a secure table or stand near an electrical outlet.  Know where the on/off switch is  located on the machine.  Follow instructions from your health care provider about how to set the pressure on your machine and when you should use it.  Do not eat or drink while the CPAP or BPAP machine is on. Food or fluids could get pushed into your lungs by the pressure of the CPAP or BPAP.  Do not smoke. Tobacco smoke residue can damage the machine.  For home use, CPAP and BPAP machines can be rented or purchased through home health care companies. Many different brands of machines are available. Renting a machine before purchasing may help you find out which particular machine works well for you.  Keep the CPAP or BPAP machine and attachments clean. Ask your health care provider for specific instructions. Get help right away if:  You have redness or open areas around your nose or mouth where the mask fits.  You have trouble using the CPAP or BPAP machine.  You cannot tolerate wearing the CPAP or BPAP mask.  You have pain, discomfort, and bloating in your abdomen. Summary  CPAP and BPAP are methods of helping a person breathe with the use of air pressure.  Both CPAP and BPAP are provided by a small machine with a flexible plastic tube that attaches to a plastic mask.  If you have trouble with the mask, it is very important that you talk with your health care provider about finding a way to make the mask easier to tolerate. This information is not intended to replace advice given to you by your health care provider. Make sure you discuss any questions you have with your health care provider. Document Revised: 03/31/2019 Document Reviewed: 10/28/2016 Elsevier Patient Education  [place].

## 2020-07-04 NOTE — Assessment & Plan Note (Addendum)
-   Patient seen for sleep consult in May 2021 for snoring/interrupted sleep  - Home sleep study on 05/16/2020 showed mild obstructive sleep apnea with an AHI of 13.7, SPO2 low 80%. - Discussed treatment options including weight loss, wedge/side sleeping position, oral appliance, CPAP therapy or referral to ENT - Patient is relocating to outside [place] soon and will let us know if he wants to start CPAP. We would need to find DME that is located close to where they are moving and he would need to follow-up virtually if he does start therapy.  - Follow up as needed

## 2020-07-04 NOTE — Progress Notes (Signed)
@Patient  ID: Wesley Fuller, male    DOB: 22-Dec-1941, 79 y.o.   MRN: [id number]  Chief Complaint  Patient presents with  . Follow-up    Referring provider: Marin Olp, MD  HPI: 79 year old male, never smoked.  Past medical history significant for hypertension, coronary artery disease, A. fib, GERD, mild dementia, osteoarthritis, melanoma.  Patient of Dr. Halford Chessman, seen for initial sleep consult on 04/28/2020 for snoring.  Patient underwent home sleep study on 05/16/2020 which showed mild obstructive sleep apnea with an AHI of 13.7, SPO2 low 80%.  07/04/2020 Patient presents today to discuss treatment options for mild obstructive sleep apnea. He feels well, no acute complaints. Snoring keep his wife awake at night. She has had to sleep in a different room the past several months. We discussed home sleep test which showed mild obstructive sleep apnea. Treatment options include oral appliance, CPAP or ENT referral. Patient is normal weight. He is not bothered by his sleeping. He does wake up during the night to use the restroom. Patient/wife are moving soon to area outside [place]. They will think about possibly starting CPAP, we would need to find DME that is located close to where they are relocation and he would need to follow-up virtually if he does start therapy.    No Known Allergies  Immunization History  Administered Date(s) Administered  . Fluad Quad(high Dose 65+) 09/30/2019  . Hepatitis A 08/11/2007  . Influenza Split 09/08/2012  . Influenza Whole 09/26/2008, 10/04/2009, 09/04/2010, 09/03/2011  . Influenza, High Dose Seasonal PF 10/06/2013, 11/08/2014, 10/15/2016, 09/11/2017, 07/23/2018, 09/22/2018  . Influenza,inj,Quad PF,6+ Mos 08/22/2015  . Influenza-Unspecified 08/03/2019  . PFIZER SARS-COV-2 Vaccination 01/07/2020, 01/28/2020  . Pneumococcal Conjugate-13 12/08/2014  . Pneumococcal Polysaccharide-23 01/28/2006  . Td 01/28/2006  . Tdap 10/16/2016  . Zoster 12/08/2014    . Zoster Recombinat (Shingrix) 05/07/2017, 08/01/2017    Past Medical History:  Diagnosis Date  . Allergy    seasonal  . Asthma    as a child  . Calcium oxalate renal stones   . Cataract 2013  . Diverticulosis   . Dysrhythmia    a fib  . ED (erectile dysfunction)   . GERD (gastroesophageal reflux disease)   . History of kidney stones   . Hypertension   . Melanoma of back ([place]) 1974  . Nail fungal infection   . Osteoarthritis of left knee 03/02/2014    Tobacco History: Social History   Tobacco Use  Smoking Status Never Smoker  Smokeless Tobacco Never Used   Counseling given: Not Answered   Outpatient Medications Prior to Visit  Medication Sig Dispense Refill  . Astaxanthin 4 MG CAPS Take 4 mg by mouth at bedtime.     Marland Kitchen buPROPion (WELLBUTRIN XL) 150 MG 24 hr tablet Take 1 tablet (150 mg total) by mouth daily. 30 tablet 5  . CALCIUM PO Take 1 tablet by mouth daily.    . cholecalciferol (VITAMIN D3) 25 MCG (1000 UT) tablet Take 1,000 Units by mouth daily.    Marland Kitchen donepezil (ARICEPT) 10 MG tablet Take 1 tablet (10 mg total) by mouth at bedtime. 90 tablet 3  . hydrochlorothiazide (MICROZIDE) 12.5 MG capsule TAKE ONE CAPSULE BY MOUTH ONE TIME DAILY  30 capsule 3  . irbesartan (AVAPRO) 300 MG tablet Take 1 tablet (300 mg total) by mouth daily. 90 tablet 3  . memantine (NAMENDA) 10 MG tablet Take 1 tablet every night for 2 weeks, then increase to 1 tablet twice a day 60  tablet 11  . mirabegron ER (MYRBETRIQ) 25 MG TB24 tablet Take 25 mg by mouth daily.    . tamsulosin (FLOMAX) 0.4 MG CAPS capsule Take 1 capsule (0.4 mg total) by mouth daily. 90 capsule 3  . TURMERIC PO Take 1,200 mg by mouth daily.    . vitamin B-12 (CYANOCOBALAMIN) 500 MCG tablet Take 1,000 mcg by mouth daily.    Alveda Reasons 20 MG TABS tablet take 1 tablet by mouth daily with supper  90 tablet 1  . atorvastatin (LIPITOR) 10 MG tablet Take 1 tablet (10 mg total) by mouth daily. 90 tablet 3   No  facility-administered medications prior to visit.   Review of Systems  Review of Systems  Constitutional: Negative.   Respiratory: Negative.   Psychiatric/Behavioral: Positive for sleep disturbance.   Physical Exam  BP 124/72 (BP Location: Left Arm, Cuff Size: Normal)   Pulse (!) 57   Temp 98.6 F (37 C)   Ht 6' (1.829 m)   Wt 182 lb (82.6 kg)   SpO2 96%   BMI 24.68 kg/m  Physical Exam Constitutional:      Appearance: Normal appearance.  Cardiovascular:     Rate and Rhythm: Normal rate.  Pulmonary:     Effort: Pulmonary effort is normal.  Neurological:     General: No focal deficit present.     Mental Status: He is alert and oriented to person, place, and time. Mental status is at baseline.  Psychiatric:        Mood and Affect: Mood normal.        Behavior: Behavior normal.        Thought Content: Thought content normal.        Judgment: Judgment normal.      Lab Results:  CBC    Component Value Date/Time   WBC 7.5 12/14/2019 1604   RBC 4.65 12/14/2019 1604   HGB 15.4 12/14/2019 1604   HCT 44.7 12/14/2019 1604   PLT 174.0 12/14/2019 1604   MCV 96.1 12/14/2019 1604   MCH 33.9 06/08/2019 0320   MCHC 34.4 12/14/2019 1604   RDW 13.4 12/14/2019 1604   LYMPHSABS 1.6 12/14/2019 1604   MONOABS 0.7 12/14/2019 1604   EOSABS 0.1 12/14/2019 1604   BASOSABS 0.1 12/14/2019 1604    BMET    Component Value Date/Time   NA 144 12/29/2019 1230   K 4.1 12/29/2019 1230   CL 105 12/29/2019 1230   CO2 25 12/29/2019 1230   GLUCOSE 96 12/29/2019 1230   GLUCOSE 100 (H) 12/14/2019 1604   BUN 19 12/29/2019 1230   CREATININE 0.91 12/29/2019 1230   CALCIUM 9.6 12/29/2019 1230   GFRNONAA 80 12/29/2019 1230   GFRAA 93 12/29/2019 1230    BNP No results found for: BNP  ProBNP No results found for: PROBNP  Imaging: No results found.   Assessment & Plan:   Mild obstructive sleep apnea - Patient seen for sleep consult in May 2021 for snoring/interrupted sleep  -  Home sleep study on 05/16/2020 showed mild obstructive sleep apnea with an AHI of 13.7, SPO2 low 80%. - Discussed treatment options including weight loss, wedge/side sleeping position, oral appliance, CPAP therapy or referral to ENT - Patient is relocating to outside [place] soon and will let us know if he wants to start CPAP. We would need to find DME that is located close to where they are moving and he would need to follow-up virtually if he does start therapy.  - Follow up  as needed   Martyn Ehrich, NP 07/04/2020

## 2020-07-05 NOTE — Progress Notes (Signed)
Reviewed and agree with assessment/plan.   Chesley Mires, MD [facility] Pulmonary/Critical Care 07/05/2020, 7:18 AM Pager:  [phone]

## 2020-07-07 ENCOUNTER — Telehealth: Payer: Self-pay | Admitting: Pulmonary Disease

## 2020-07-07 ENCOUNTER — Ambulatory Visit (INDEPENDENT_AMBULATORY_CARE_PROVIDER_SITE_OTHER): Payer: Medicare Other | Admitting: Neurology

## 2020-07-07 ENCOUNTER — Other Ambulatory Visit: Payer: Self-pay

## 2020-07-07 ENCOUNTER — Encounter: Payer: Self-pay | Admitting: Neurology

## 2020-07-07 VITALS — BP 93/62 | HR 79 | Ht 72.0 in | Wt 182.8 lb

## 2020-07-07 DIAGNOSIS — F0390 Unspecified dementia without behavioral disturbance: Secondary | ICD-10-CM

## 2020-07-07 DIAGNOSIS — G4733 Obstructive sleep apnea (adult) (pediatric): Secondary | ICD-10-CM

## 2020-07-07 DIAGNOSIS — F03B0 Unspecified dementia, moderate, without behavioral disturbance, psychotic disturbance, mood disturbance, and anxiety: Secondary | ICD-10-CM

## 2020-07-07 MED ORDER — MEMANTINE HCL 10 MG PO TABS: ORAL_TABLET | ORAL | 3 refills | Status: AC

## 2020-07-07 MED ORDER — DONEPEZIL HCL 10 MG PO TABS: 10.0000 mg | ORAL_TABLET | Freq: Every day | ORAL | 3 refills | Status: AC

## 2020-07-07 NOTE — Patient Instructions (Addendum)
Great to see you!  1. Continue Donepezil and Memantine  2. Please consider trying the CPAP and see how you feel with the fatigue and it may help with memory as well  3. Follow-up in 6 months, call for any changes. Good luck with the move!  FALL PRECAUTIONS: Be cautious when walking. Scan the area for obstacles that may increase the risk of trips and falls. When getting up in the mornings, sit up at the edge of the bed for a few minutes before getting out of bed. Consider elevating the bed at the head end to avoid drop of blood pressure when getting up. Walk always in a well-lit room (use night lights in the walls). Avoid area rugs or power cords from appliances in the middle of the walkways. Use a walker or a cane if necessary and consider physical therapy for balance exercise. Get your eyesight checked regularly.  HOME SAFETY: Consider the safety of the kitchen when operating appliances like stoves, microwave oven, and blender. Consider having supervision and share cooking responsibilities until no longer able to participate in those. Accidents with firearms and other hazards in the house should be identified and addressed as well.  ABILITY TO BE LEFT ALONE: If patient is unable to contact 911 operator, consider using LifeLine, or when the need is there, arrange for someone to stay with patients. Smoking is a fire hazard, consider supervision or cessation. Risk of wandering should be assessed by caregiver and if detected at any point, supervision and safe proof recommendations should be instituted.   RECOMMENDATIONS FOR ALL PATIENTS WITH MEMORY PROBLEMS: 1. Continue to exercise (Recommend 30 minutes of walking everyday, or 3 hours every week) 2. Increase social interactions - continue going to [place] and enjoy social gatherings with friends and family 3. Eat healthy, avoid fried foods and eat more fruits and vegetables 4. Maintain adequate blood pressure, blood sugar, and blood cholesterol level.  Reducing the risk of stroke and cardiovascular disease also helps promoting better memory. 5. Avoid stressful situations. Live a simple life and avoid aggravations. Organize your time and prepare for the next day in anticipation. 6. Sleep well, avoid any interruptions of sleep and avoid any distractions in the bedroom that may interfere with adequate sleep quality 7. Avoid sugar, avoid sweets as there is a strong link between excessive sugar intake, diabetes, and cognitive impairment The Mediterranean diet has been shown to help patients reduce the risk of progressive memory disorders and reduces cardiovascular risk. This includes eating fish, eat fruits and green leafy vegetables, nuts like almonds and hazelnuts, walnuts, and also use olive oil. Avoid fast foods and fried foods as much as possible. Avoid sweets and sugar as sugar use has been linked to worsening of memory function.  There is always a concern of gradual progression of memory problems. If this is the case, then we may need to adjust level of care according to patient needs. Support, both to the patient and caregiver, should then be put into place.

## 2020-07-07 NOTE — Telephone Encounter (Signed)
Spoke with pt's wife, had questions about the benefits of using a DME closer to pt's home versus one farther away.  I advised that it would be beneficial to use a DME closer to home in case any troubleshooting was needed on the machine from the DME.  Wife expressed understanding.  States that she and pt will look into DME companies close to their home that they are moving to next month and call back next week to tell us what company they would like pt's CPAP order sent to.    Will keep message open until pt/spouse calls back.

## 2020-07-07 NOTE — Progress Notes (Signed)
NEUROLOGY FOLLOW UP OFFICE NOTE  Wesley Fuller [id number] [birth year]  HISTORY OF PRESENT ILLNESS: I had the pleasure of seeing Wesley Fuller in follow-up in the neurology clinic on 07/07/2020.  The patient was last seen 6 months ago for mild to moderate dementia, likely due to Alzheimer's disease. He is again accompanied by his wife who helps supplement the history today.  Records and images were personally reviewed where available.  On his last visit, his wife reported continued deterioration. Memantine was added to Donepezil. He is not driving. His wife manages medications and finances. They report overall stable symptoms since his last visit. He is independent with dressing and bathing. He is able to use the TV remote control and microwave without issues. He was diagnosed with mild sleep apnea, his wife is encouraging a CPAP trial, especially because despite sleeping 12 hours at night, he still feels tired in the morning. He snores loudly and has restless sleep. His wife sleeps in a different room now. He was also recently started on an antidepressant. He thinks his mood is pretty good. He denies any headaches, dizziness, focal numbness/tingling/weakness. He denies any falls, his wife reminds him of a recent fall. No paranoia or hallucinations. They are planning to move to a retirement community in 3 weeks in [place].    History on Initial Assessment 11/29/2015: This is a very pleasant 79 year old right-handed man with a history of hypertension, presenting for evaluation of worsening memory. He started noticing memory changes over the past 9-12 months, however his wife reports noticing changes around 2-1/2 years ago. He mostly notices difficulties with name and word retrieval, as well as misplacing things at home. His wife reports that they were playing bridge 2-1/2 years ago and he got confused with what cards to play. He does not recall this incident. Another time they were visiting their son  in [place] and kept changing locations, but he would be confused that whole week as to what day it was or could not recall where they should be. Last September, he was driving out of state for a golf game that he attends once a year, but became so anxious about his drive and uncertain despite being given directions. His wife states it is out of character for him to be anxious. His wife reports that despite writing things down, he would get confused if he said yes to a commitment. He is more unsure of himself on the computer and cellphone, asking his wife to check if an email he wrote looked okay. He has occasional word-finding difficulties and has always had problems multitasking. He denies getting lost driving, no missed medications or bill payments, although his wife pays majority of bills. He reports an odd instance 2-1/2 weeks ago while out of town, he went to bed early and thought he was dreaming that he was in his house but it looked different, he got up and asked people what was going on, thinking he was still dreaming, but was later on told by friends that he did come and ask them this. He was more tired that day from driving long distance. His wife reports that she notices more confusion and memory issues when he is tired. MMSE at PCP office last 08/22/15 was 27/30. He was offered Aricept but did not start this, instead they have been to [facility] for consideration of joining a research study. He underwent neurocognitive testing but they have not heard results yet.  He denies any  headaches, dizziness, diplopia, dysarthria, dysphagia, neck/back pain, focal numbness/tingling/weakness, tremors, no falls. He has a change in stool consistency. He reports chronic poor sense of smell. No REM behavior disorder. He denies any significant head injuries. His brother has Parkinson's dementia and had a good response to Aricept. He drinks one glass of wine a night, 1-2 drinks occasionally on weekends.    PAST  MEDICAL HISTORY: Past Medical History:  Diagnosis Date   Allergy    seasonal   Asthma    as a child   Calcium oxalate renal stones    Cataract 2013   Diverticulosis    Dysrhythmia    a fib   ED (erectile dysfunction)    GERD (gastroesophageal reflux disease)    History of kidney stones    Hypertension    Melanoma of back ([place]) 1974   Nail fungal infection    Osteoarthritis of left knee 03/02/2014    MEDICATIONS: Current Outpatient Medications on File Prior to Visit  Medication Sig Dispense Refill   Astaxanthin 4 MG CAPS Take 4 mg by mouth at bedtime.      atorvastatin (LIPITOR) 10 MG tablet Take 1 tablet (10 mg total) by mouth daily. 90 tablet 3   buPROPion (WELLBUTRIN XL) 150 MG 24 hr tablet Take 1 tablet (150 mg total) by mouth daily. 30 tablet 5   CALCIUM PO Take 1 tablet by mouth daily.     cholecalciferol (VITAMIN D3) 25 MCG (1000 UT) tablet Take 1,000 Units by mouth daily.     donepezil (ARICEPT) 10 MG tablet Take 1 tablet (10 mg total) by mouth at bedtime. 90 tablet 3   hydrochlorothiazide (MICROZIDE) 12.5 MG capsule TAKE ONE CAPSULE BY MOUTH ONE TIME DAILY  30 capsule 3   irbesartan (AVAPRO) 300 MG tablet Take 1 tablet (300 mg total) by mouth daily. 90 tablet 3   memantine (NAMENDA) 10 MG tablet Take 1 tablet every night for 2 weeks, then increase to 1 tablet twice a day 60 tablet 11   mirabegron ER (MYRBETRIQ) 25 MG TB24 tablet Take 25 mg by mouth daily.     tamsulosin (FLOMAX) 0.4 MG CAPS capsule Take 1 capsule (0.4 mg total) by mouth daily. 90 capsule 3   TURMERIC PO Take 1,200 mg by mouth daily.     vitamin B-12 (CYANOCOBALAMIN) 500 MCG tablet Take 1,000 mcg by mouth daily.     XARELTO 20 MG TABS tablet take 1 tablet by mouth daily with supper  90 tablet 1   No current facility-administered medications on file prior to visit.    ALLERGIES: No Known Allergies  FAMILY HISTORY: Family History  Problem Relation Age of Onset   Colon  cancer Father    Colon cancer Paternal Uncle    Colon cancer Paternal Uncle    Hypertension Other     SOCIAL HISTORY: Social History   Socioeconomic History   Marital status: Married    Spouse name: Not on file   Number of children: 2   Years of education: Not on file   Highest education level: Not on file  Occupational History   Occupation: Retired  Tobacco Use   Smoking status: Never Smoker   Smokeless tobacco: Never Used  Scientific laboratory technician Use: Never used  Substance and Sexual Activity   Alcohol use: Yes    Alcohol/week: 5.0 standard drinks    Types: 5 Glasses of wine per week    Comment: does not drink very much; glass of wine  every day   Drug use: No   Sexual activity: Yes    Partners: Female  Other Topics Concern   Not on file  Social History Narrative   Married ([name]) 1965. 2 sons- 1 in [place]. 3 grandsons.       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[employer], [employer], [employer]: golf, travel   Social Determinants of Radio broadcast assistant Strain:    Difficulty of Paying Living Expenses:   Food Insecurity:    Worried About Charity fundraiser in the Last Year:    Arboriculturist in the Last Year:   Transportation Needs:    Film/video editor (Medical):    Lack of Transportation (Non-Medical):   Physical Activity:    Days of Exercise per Week:    Minutes of Exercise per Session:   Stress:    Feeling of Stress :   Social Connections:    Frequency of Communication with Friends and Family:    Frequency of Social Gatherings with Friends and Family:    Attends Religious Services:    Active Member of Clubs or Organizations:    Attends Music therapist:    Marital Status:   Intimate Partner Violence:    Fear of Current or Ex-Partner:    Emotionally Abused:    Physically Abused:    Sexually Abused:     PHYSICAL EXAM: Vitals:   07/07/20 1134    BP: 93/62  Pulse: 79  SpO2: 95%   General: No acute distress Head:  Normocephalic/atraumatic Skin/Extremities: No rash, no edema Neurological Exam: alert and oriented to person, place, and time. No aphasia or dysarthria. Fund of knowledge is appropriate.  Recent and remote memory are impaired.  Attention and concentration are normal.    Able to name objects and repeat phrases. MMSE 26/30. He was able to draw a clock but took several minutes to complete MMSE - Mini Mental State Exam 07/07/2020 05/07/2018 04/15/2017  Not completed: - (No Data) -  Orientation to time 5 5 5   Orientation to Place 5 5 5   Registration 3 3 3   Attention/ Calculation 5 5 4   Recall 1 2 3   Language- name 2 objects 2 2 2   Language- repeat 1 1 1   Language- follow 3 step command 2 3 3   Language- read & follow direction 1 1 1   Write a sentence 1 1 1   Copy design 0 1 1  Total score 26 29 29    Cranial nerves: Pupils equal, round. Extraocular movements intact with no nystagmus. Visual fields full. No facial asymmetry. Motor: Bulk and tone normal, muscle strength 5/5 throughout with no pronator drift.  Finger to nose testing intact.  Gait slightly hunched, slightly wide-based and cautious, no ataxia   IMPRESSION: This is a very pleasant 79 yo RH man with hypertension, atrial fibrillation, and mild to moderate dementia, likely due to Alzheimer's disease. MMSE today 26/30. Continue Donepezil 10mg  daily and Memantine 10mg  BID. He was encouraged to proceed with CPAP trial as this may help with memory as well. Continue to monitor mood. We again discussed the importance of control of vascular risk factors, physical exercise, and brain stimulation exercises for brain health. Follow-up in 6 months, they know to call for any changes.    Thank you for allowing me to participate in his care.  Please do not hesitate to call for any questions or concerns.   Ellouise Newer,  M.D.   CC: Dr. Yong Channel

## 2020-07-12 NOTE — Telephone Encounter (Signed)
Still awaiting call back from the pt

## 2020-07-13 ENCOUNTER — Other Ambulatory Visit: Payer: Self-pay

## 2020-07-13 NOTE — Telephone Encounter (Signed)
lmom X1  Order is pending

## 2020-07-13 NOTE — Telephone Encounter (Signed)
CPAP auto titrate 5-20cm h20, maks of choice, supplies and enroll in [place]. Will need virtual visit in 30-90 days to review usage and then can establish with a provider closer to where he is moving

## 2020-07-13 NOTE — Telephone Encounter (Signed)
Spoke with Wesley Fuller who stated to go ahead and place order given pt will be moving in 2 weeks. Was told to state that in order. Beth please advise on CPAP settings.

## 2020-07-13 NOTE — Telephone Encounter (Signed)
Patient wife is aware nothing further is needed at this time.

## 2020-07-13 NOTE — Telephone Encounter (Signed)
Pt's wife calling back about this. Please return call.

## 2020-07-31 DIAGNOSIS — I10 Essential (primary) hypertension: Secondary | ICD-10-CM | POA: Diagnosis not present

## 2020-07-31 DIAGNOSIS — G4733 Obstructive sleep apnea (adult) (pediatric): Secondary | ICD-10-CM | POA: Diagnosis not present

## 2020-08-31 DIAGNOSIS — G4733 Obstructive sleep apnea (adult) (pediatric): Secondary | ICD-10-CM | POA: Diagnosis not present

## 2020-09-13 DIAGNOSIS — R3129 Other microscopic hematuria: Secondary | ICD-10-CM | POA: Diagnosis not present

## 2020-09-18 ENCOUNTER — Other Ambulatory Visit: Payer: Self-pay | Admitting: Cardiology

## 2020-09-19 MED ORDER — RIVAROXABAN 20 MG PO TABS: ORAL_TABLET | ORAL | 1 refills | Status: AC

## 2020-09-25 DIAGNOSIS — R3129 Other microscopic hematuria: Secondary | ICD-10-CM | POA: Diagnosis not present

## 2020-09-25 DIAGNOSIS — N200 Calculus of kidney: Secondary | ICD-10-CM | POA: Diagnosis not present

## 2020-09-30 DIAGNOSIS — G4733 Obstructive sleep apnea (adult) (pediatric): Secondary | ICD-10-CM | POA: Diagnosis not present

## 2020-10-02 DIAGNOSIS — R350 Frequency of micturition: Secondary | ICD-10-CM | POA: Diagnosis not present

## 2020-10-02 DIAGNOSIS — K8689 Other specified diseases of pancreas: Secondary | ICD-10-CM | POA: Diagnosis not present

## 2020-10-02 DIAGNOSIS — R413 Other amnesia: Secondary | ICD-10-CM | POA: Diagnosis not present

## 2020-10-02 DIAGNOSIS — R634 Abnormal weight loss: Secondary | ICD-10-CM | POA: Diagnosis not present

## 2020-10-02 DIAGNOSIS — R5383 Other fatigue: Secondary | ICD-10-CM | POA: Diagnosis not present

## 2020-10-18 DIAGNOSIS — I10 Essential (primary) hypertension: Secondary | ICD-10-CM | POA: Diagnosis not present

## 2020-10-18 DIAGNOSIS — G4733 Obstructive sleep apnea (adult) (pediatric): Secondary | ICD-10-CM | POA: Diagnosis not present

## 2020-10-23 DIAGNOSIS — N210 Calculus in bladder: Secondary | ICD-10-CM | POA: Diagnosis not present

## 2020-10-23 DIAGNOSIS — R3129 Other microscopic hematuria: Secondary | ICD-10-CM | POA: Diagnosis not present

## 2020-10-25 ENCOUNTER — Telehealth: Payer: Self-pay | Admitting: Family Medicine

## 2020-10-25 NOTE — Telephone Encounter (Signed)
Left message for patient to call back and schedule Medicare Annual Wellness Visit (AWV) either virtually OR in office.   Last AWV 10/25/19; please schedule at anytime with [facility]-Nurse Health Advisor at [facility]  This should be a 45 minute visit.

## 2020-10-27 ENCOUNTER — Telehealth: Payer: Self-pay

## 2020-10-27 NOTE — Telephone Encounter (Signed)
   [facility] Pre-operative Risk Assessment    HEARTCARE STAFF: - Please ensure there is not already an duplicate clearance open for this procedure. - Under Visit Info/Reason for Call, type in Other and utilize the format Clearance MM/DD/YY or Clearance TBD. Do not use dashes or single digits. - If request is for dental extraction, please clarify the # of teeth to be extracted.  Request for surgical clearance:  1. What type of surgery is being performed? Prostate laser vaporization, cystolitholapaxy    2. When is this surgery scheduled? 11/24/2020   3. What type of clearance is required (medical clearance vs. Pharmacy clearance to hold med vs. Both)? Both  4. Are there any medications that need to be held prior to surgery and how long? Xarelto, for 5 days before the procedure    5. Practice name and name of physician performing surgery? [facility], Reymundo Poll, MD   6. What is the office phone number? [phone]   7.   What is the office fax number? [phone]  8.   Anesthesia type (None, local, MAC, general) ?    Wesley Fuller 10/27/2020, 9:11 AM  _________________________________________________________________   (provider comments below)

## 2020-10-30 DIAGNOSIS — K862 Cyst of pancreas: Secondary | ICD-10-CM | POA: Diagnosis not present

## 2020-10-30 DIAGNOSIS — K8689 Other specified diseases of pancreas: Secondary | ICD-10-CM | POA: Diagnosis not present

## 2020-10-31 DIAGNOSIS — G4733 Obstructive sleep apnea (adult) (pediatric): Secondary | ICD-10-CM | POA: Diagnosis not present

## 2020-11-01 DIAGNOSIS — E042 Nontoxic multinodular goiter: Secondary | ICD-10-CM | POA: Diagnosis not present

## 2020-11-08 ENCOUNTER — Telehealth: Payer: Self-pay | Admitting: Cardiology

## 2020-11-08 NOTE — Telephone Encounter (Signed)
   Primary Cardiologist: Jenean Lindau, MD  Chart reviewed as part of pre-operative protocol coverage. Because of Wesley Fuller's past medical history and time since last visit, he will require a follow-up visit in order to better assess preoperative cardiovascular risk.  Pre-op covering staff: - Please schedule appointment and call patient to inform them. If patient already had an upcoming appointment within acceptable timeframe, please add "pre-op clearance" to the appointment notes so provider is aware. - Please contact requesting surgeon's office via preferred method (i.e, phone, fax) to inform them of need for appointment prior to surgery.  If applicable, this message will also be routed to pharmacy pool and/or primary cardiologist for input on holding anticoagulant/antiplatelet agent as requested below so that this information is available to the clearing provider at time of patient's appointment.   [place], [place]  11/08/2020, 3:50 PM

## 2020-11-08 NOTE — Telephone Encounter (Signed)
Called pt and LVM for pt to CB to schedule his preop clearance appt with [name] in [place]. kbl

## 2020-11-08 NOTE — Telephone Encounter (Signed)
I will send a message to the [facility]/[place] scheduling team to please assist in finding an appt for this pt for pre op clearance.

## 2020-11-08 NOTE — Telephone Encounter (Signed)
-----   Message from [name], [place] sent at 11/08/2020  4:01 PM EST ----- Regarding: pre op appt please Good afternoon everyone,  Per pre op team pt needs an appt for pre op clearance. Could someone please reach to the pt with an appt. I really would appreciate your help in this matter.  Thank you [name]

## 2020-11-08 NOTE — Telephone Encounter (Signed)
I have [facility] on the line and they said that they had sent in a medical clearance in on 10/27/20 and haven't heard anything back yet. said that they really need to speak with someone on the pre-op team.

## 2020-11-08 NOTE — Telephone Encounter (Signed)
Clearance request was never forwarded to pre op pool.

## 2020-11-09 ENCOUNTER — Other Ambulatory Visit: Payer: Self-pay | Admitting: Family Medicine

## 2020-11-09 NOTE — Telephone Encounter (Signed)
(  very important) You completed this message on 11/09/2020. The information that appears might not be up to date.  (important suggestion) You completed this message for P Cv Div Preop Callback.  Appointment (pt needs pre op appt)  You 1 hour ago (1:20 PM)     CORRECT FAX # IS [phone]      Documentation   You 1 hour ago (1:15 PM)     I will fax these notes to requesting office today so that they may proceed to get clearance from pt's new cardiologist. Will remove from the pre op call back pool.       Documentation   Erlene Quan, PA-C routed conversation to Cv Div Preop Callback 1 hour ago (1:12 PM)  Rosalyn Gess, Doreene Burke, PA-C 1 hour ago (1:11 PM)     The pt can get clearance from his new cardiologist.  Kerin Ransom PA-C 11/09/2020 1:10 PM       Documentation   Darrin Nipper L routed conversation to Cv Div Preop 2 hours ago (12:04 PM)  Darrin Nipper L 2 hours ago (12:03 PM)  JR    Wife of the patient called. The patient and his Wife have moved to [place], [place]. The patient is scheduled to see his new Cardiologist Dr. Lorene Dy at the [facility] in [place] on 11/15/20 at 11:30 am Office Phone Number [phone]  It would be difficult for the patient to come back to [facility] to see Dr. Geraldo Pitter.  Please let the patient know what he needs to do      Documentation   Hafley,Sharon [phone]  Darrin Nipper L 2 hours ago (11:53 AM)  [name] 21 hours ago (4:51 PM)  [place] pt and LVM for pt to CB to schedule his preop clearance appt with [name] in [place]. kbl      Documentation   [name], [name] 21 hours ago (4:50 PM)  [name] 21 hours ago (4:50 PM)  [name]   ----- Message from [name], [place] sent at 11/08/2020  4:01 PM EST ----- Regarding: pre op appt please Good afternoon everyone,  Per pre op team pt needs an appt for pre op clearance. Could someone please reach to the pt with  an appt. I really would appreciate your help in this matter.  Thank you [name]

## 2020-11-09 NOTE — Telephone Encounter (Signed)
The pt can get clearance from his new cardiologist.  Kerin Ransom PA-C 11/09/2020 1:10 PM

## 2020-11-09 NOTE — Telephone Encounter (Signed)
  Wife of the patient called. The patient and his Wife have moved to [place], [place]. The patient is scheduled to see his new Cardiologist Dr. Lorene Dy at the [facility] in [place] on 11/15/20 at 11:30 am Office Phone Number [phone]  It would be difficult for the patient to come back to [facility] to see Dr. Geraldo Pitter.  Please let the patient know what he needs to do

## 2020-11-09 NOTE — Telephone Encounter (Signed)
I will fax these notes to requesting office today so that they may proceed to get clearance from pt's new cardiologist. Will remove from the pre op call back pool.

## 2020-11-09 NOTE — Telephone Encounter (Signed)
CORRECT FAX # IS [phone]

## 2020-11-10 ENCOUNTER — Telehealth: Payer: Self-pay | Admitting: Cardiology

## 2020-11-10 NOTE — Telephone Encounter (Signed)
Called on two separate occasions and LVM for pt to CB./kbl

## 2020-11-14 ENCOUNTER — Telehealth: Payer: Self-pay | Admitting: Cardiology

## 2020-11-14 NOTE — Telephone Encounter (Signed)
Attempted to call patient on both numbers listed. No answer/left message for pt to call back to schedule pre-op appt.

## 2020-11-15 DIAGNOSIS — I4891 Unspecified atrial fibrillation: Secondary | ICD-10-CM | POA: Diagnosis not present

## 2020-11-24 DIAGNOSIS — R350 Frequency of micturition: Secondary | ICD-10-CM | POA: Diagnosis not present

## 2020-11-24 DIAGNOSIS — I4891 Unspecified atrial fibrillation: Secondary | ICD-10-CM | POA: Diagnosis not present

## 2020-11-24 DIAGNOSIS — N210 Calculus in bladder: Secondary | ICD-10-CM | POA: Diagnosis not present

## 2020-11-24 DIAGNOSIS — G4733 Obstructive sleep apnea (adult) (pediatric): Secondary | ICD-10-CM | POA: Diagnosis not present

## 2020-11-24 DIAGNOSIS — K219 Gastro-esophageal reflux disease without esophagitis: Secondary | ICD-10-CM | POA: Diagnosis not present

## 2020-11-24 DIAGNOSIS — I10 Essential (primary) hypertension: Secondary | ICD-10-CM | POA: Diagnosis not present

## 2020-11-24 DIAGNOSIS — N138 Other obstructive and reflux uropathy: Secondary | ICD-10-CM | POA: Diagnosis not present

## 2020-11-24 DIAGNOSIS — Z7901 Long term (current) use of anticoagulants: Secondary | ICD-10-CM | POA: Diagnosis not present

## 2020-11-24 DIAGNOSIS — M1990 Unspecified osteoarthritis, unspecified site: Secondary | ICD-10-CM | POA: Diagnosis not present

## 2020-11-24 DIAGNOSIS — Z87442 Personal history of urinary calculi: Secondary | ICD-10-CM | POA: Diagnosis not present

## 2020-11-24 DIAGNOSIS — D759 Disease of blood and blood-forming organs, unspecified: Secondary | ICD-10-CM | POA: Diagnosis not present

## 2020-11-30 DIAGNOSIS — G4733 Obstructive sleep apnea (adult) (pediatric): Secondary | ICD-10-CM | POA: Diagnosis not present

## 2020-12-11 ENCOUNTER — Other Ambulatory Visit: Payer: Self-pay

## 2020-12-11 ENCOUNTER — Other Ambulatory Visit: Payer: Self-pay | Admitting: Family Medicine

## 2020-12-11 ENCOUNTER — Encounter: Payer: Self-pay | Admitting: Family Medicine

## 2020-12-11 ENCOUNTER — Ambulatory Visit (INDEPENDENT_AMBULATORY_CARE_PROVIDER_SITE_OTHER): Payer: Medicare Other | Admitting: Family Medicine

## 2020-12-11 NOTE — Progress Notes (Signed)
  Patient arrived just a few minutes late to his appointment and had already begun seeing the next patient.  Patient had another appointment and was unable to wait for me to complete my first visit.  No charge for visit

## 2020-12-11 NOTE — Patient Instructions (Signed)
Health Maintenance Due  Topic Date Due  . Hepatitis C Screening  Never done  . INFLUENZA VACCINE  07/23/2020    Depression screen [facility] 2/9 05/18/2020 02/10/2020 12/14/2019  Decreased Interest 2 1 1   Down, Depressed, Hopeless 3 0 1  PHQ - 2 Score 5 1 2   Altered sleeping 3 0 2  Tired, decreased energy 3 3 0  Change in appetite 0 0 0  Feeling bad or failure about yourself  0 0 0  Trouble concentrating 3 3 3   Moving slowly or fidgety/restless 0 0 0  Suicidal thoughts 0 0 0  PHQ-9 Score 14 7 7   Difficult doing work/chores Not difficult at all Not difficult at all Not difficult at all  Some recent data might be hidden    Recommended follow up: No follow-ups on file.

## 2020-12-13 DIAGNOSIS — H6123 Impacted cerumen, bilateral: Secondary | ICD-10-CM | POA: Diagnosis not present

## 2020-12-13 DIAGNOSIS — H9190 Unspecified hearing loss, unspecified ear: Secondary | ICD-10-CM | POA: Diagnosis not present

## 2020-12-21 IMAGING — CT CT HEART MORP W/ CTA COR W/ SCORE W/ CA W/CM &/OR W/O CM
4 of 7 series · 8 of 20 positions shown, 9 images · IV contrast (APPLIED)
Comparison: None.
COMPARISON: None.

Addendum:
EXAM:
OVER-READ INTERPRETATION  CT CHEST

The following report is an over-read performed by radiologist Dr.
Piratet Esterina [REDACTED] on 01/05/2020. This
over-read does not include interpretation of cardiac or coronary
anatomy or pathology. The coronary CTA interpretation by the
cardiologist is attached.
CLINICAL DATA: 79 year old male with medical history of
hypertension and paroxsymal atrial fibrillation.
Cardiac/Coronary  CT
TECHNIQUE: The patient was scanned on a [employer] Force scanner.

[Series 6: best diast 72 % · axial · 0.39mm/px · z∈[+1095,+1144]mm · 2 of 368 slices shown, 3 images]
[im 123/368  vessel]
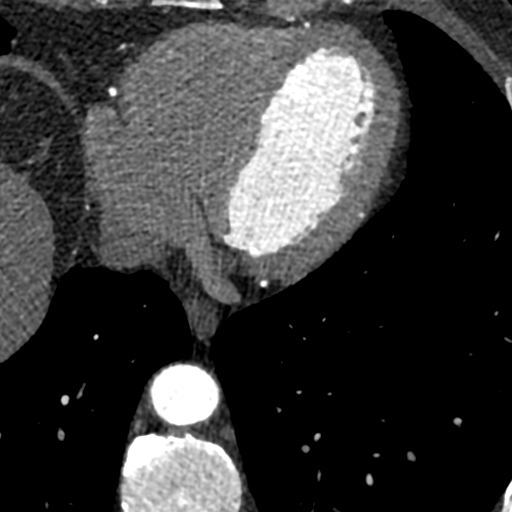
[im 123/368  lung]
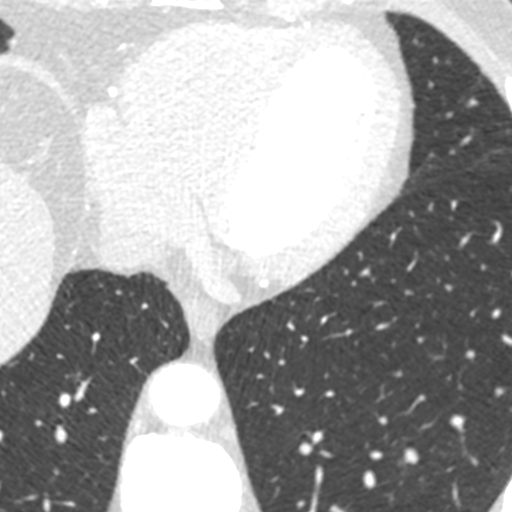
[im 245/368  vessel]
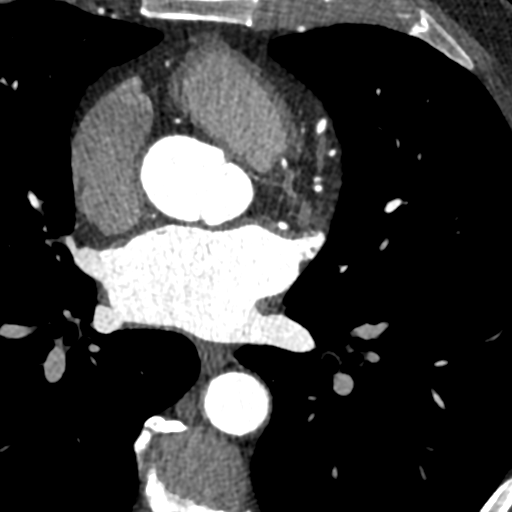

[Series 7: best syst 35 % · axial · 0.39mm/px · z∈[+1095,+1144]mm · 2 of 368 slices shown]
[im 123/368  vessel]
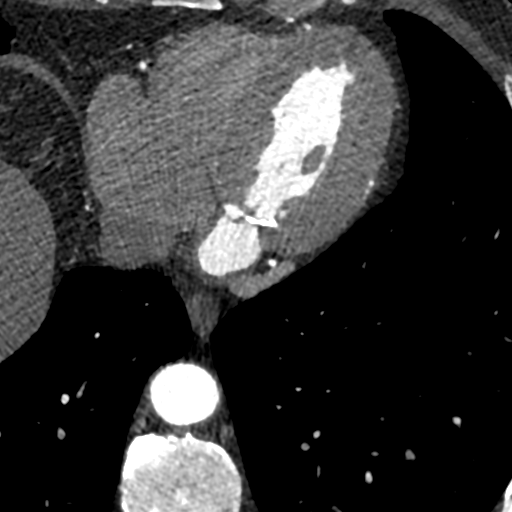
[im 245/368  vessel]
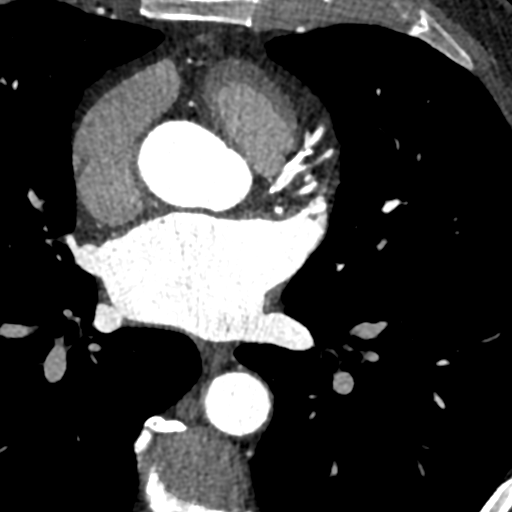

[Series 8: ts diast sharp 35 % · axial · 0.39mm/px · z∈[+1095,+1144]mm · 2 of 368 slices shown]
[im 123/368  lung]
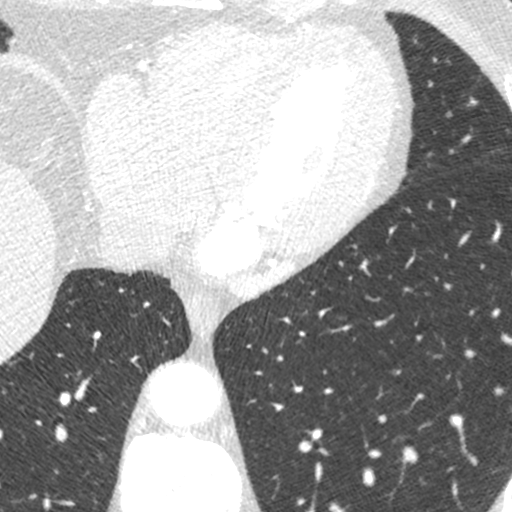
[im 245/368  lung]
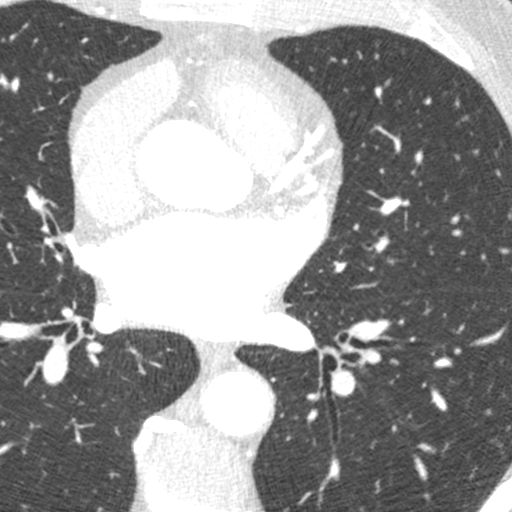

[Series 9: ts syst sharp 35 % · axial · 0.39mm/px · z∈[+1095,+1144]mm · 2 of 368 slices shown]
[im 123/368  lung]
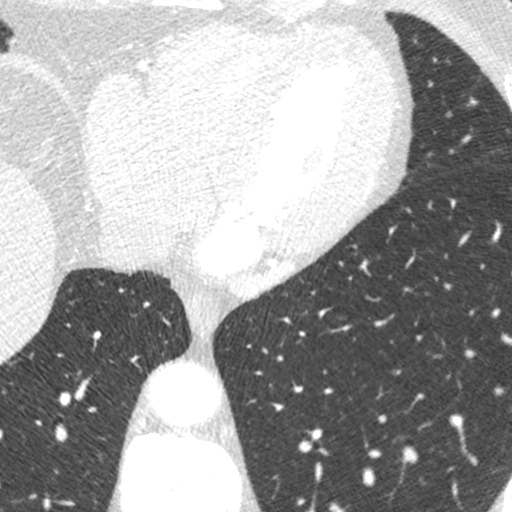
[im 245/368  lung]
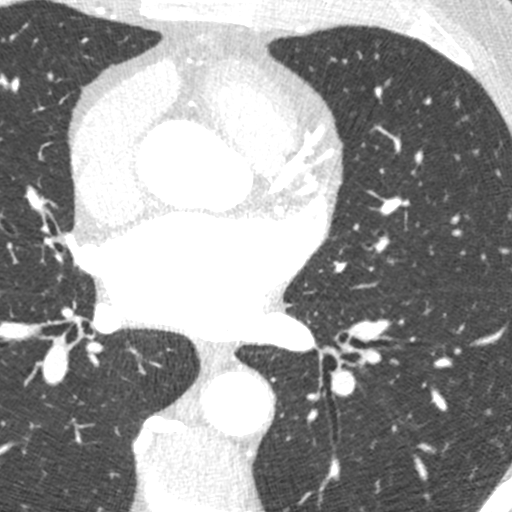

[8 of 20 positions shown; findings below may reference images not displayed]

FINDINGS: Limited view of the lung parenchyma demonstrates no suspicious
nodularity. Airways are normal.

Limited view of the mediastinum demonstrates no adenopathy.
Esophagus normal.

Limited view of the upper abdomen unremarkable.

Limited view of the skeleton and chest wall is unremarkable.
IMPRESSION: No significant extracardiac findings.
FINDINGS: A 120 kV prospective scan was triggered in the descending thoracic
aorta at 111 HU's. Axial non-contrast 3 mm slices were carried out
through the heart. The data set was analyzed on a dedicated work
station and scored using the Agatson method. Gantry rotation speed
was 250 msecs and collimation was .6 mm. No beta blockade and 0.8 mg
of sl NTG was given. The 3D data set was reconstructed in 5%
intervals of the 67-82 % of the R-R cycle. Diastolic phases were
analyzed on a dedicated work station using MPR, MIP and VRT modes.
The patient received 80 cc of contrast.

Aorta: Normal size. There is mild calcification at the aortic root.
No dissection.

Aortic Valve: Trileaflet. Mild calcifications of the aortic valve
leaflets mostly on the left coronary cusp.

Coronary Arteries:  Normal coronary origin.  Right dominance.

RCA is a large dominant artery that gives rise to PDA and PLVB.
There is minimal (<25%) calcified plaque in the mid portion of the
vessel. The proximal and the distal portion of the vessel does not
have any plaques.

Left main is a large artery that gives rise to LAD, Ramus
Intermedius and LCX arteries.

LAD is a large vessel. The proximal portion with a mild (<49%)
calcified plaque, The mid portion of the vessel with a moderate
(50-69%) calcified plaque. The distal portion of the vessel with no
plaque. D1 is a large caliber vessel with no plaque.

Ramus Intermedius with no plaque.

LCX is a non-dominant artery that gives rise to one large OM1
branch. There is no plaque.

Other findings:

Normal pulmonary vein drainage into the left atrium with no evidence
of pulmonary vein stenosis.

Normal left atrial appendage without a thrombus.

Normal size of the pulmonary artery.

Mild to moderate mitral annular calcification.
IMPRESSION: 1. Coronary calcium score of 223. This was 85 percentile for age and
sex matched control.

2. Normal coronary origin with right dominance.

3. Moderate Coronary artery disease, with a moderate (50-69%)
calcified plaque in the mid portion of the LAD. The study will be
sent for FFR.

4.  Mild to moderate mitral annular calcification.

Alysa Clanton, DO

*** End of Addendum ***
EXAM:
OVER-READ INTERPRETATION  CT CHEST

The following report is an over-read performed by radiologist Dr.
Piratet Esterina [REDACTED] on 01/05/2020. This
over-read does not include interpretation of cardiac or coronary
anatomy or pathology. The coronary CTA interpretation by the
cardiologist is attached.
FINDINGS: Limited view of the lung parenchyma demonstrates no suspicious
nodularity. Airways are normal.

Limited view of the mediastinum demonstrates no adenopathy.
Esophagus normal.

Limited view of the upper abdomen unremarkable.

Limited view of the skeleton and chest wall is unremarkable.
IMPRESSION: No significant extracardiac findings.

## 2020-12-26 ENCOUNTER — Other Ambulatory Visit: Payer: Self-pay | Admitting: Cardiology

## 2020-12-31 DIAGNOSIS — G4733 Obstructive sleep apnea (adult) (pediatric): Secondary | ICD-10-CM | POA: Diagnosis not present

## 2021-01-11 DIAGNOSIS — R5383 Other fatigue: Secondary | ICD-10-CM | POA: Diagnosis not present

## 2021-01-11 DIAGNOSIS — E785 Hyperlipidemia, unspecified: Secondary | ICD-10-CM | POA: Diagnosis not present

## 2021-01-13 ENCOUNTER — Other Ambulatory Visit: Payer: Self-pay | Admitting: Family Medicine

## 2021-01-14 ENCOUNTER — Encounter: Payer: Self-pay | Admitting: Family Medicine

## 2021-01-14 MED ORDER — BUPROPION HCL ER (XL) 150 MG PO TB24: 150.0000 mg | ORAL_TABLET | Freq: Every day | ORAL | 3 refills | Status: AC

## 2021-01-17 DIAGNOSIS — R269 Unspecified abnormalities of gait and mobility: Secondary | ICD-10-CM | POA: Diagnosis not present

## 2021-01-17 DIAGNOSIS — G309 Alzheimer's disease, unspecified: Secondary | ICD-10-CM | POA: Diagnosis not present

## 2021-01-19 DIAGNOSIS — C439 Malignant melanoma of skin, unspecified: Secondary | ICD-10-CM | POA: Diagnosis not present

## 2021-01-19 DIAGNOSIS — I4891 Unspecified atrial fibrillation: Secondary | ICD-10-CM | POA: Diagnosis not present

## 2021-01-19 DIAGNOSIS — I77810 Thoracic aortic ectasia: Secondary | ICD-10-CM | POA: Diagnosis not present

## 2021-01-19 DIAGNOSIS — Z23 Encounter for immunization: Secondary | ICD-10-CM | POA: Diagnosis not present

## 2021-01-19 DIAGNOSIS — G309 Alzheimer's disease, unspecified: Secondary | ICD-10-CM | POA: Diagnosis not present

## 2021-01-19 DIAGNOSIS — L570 Actinic keratosis: Secondary | ICD-10-CM | POA: Diagnosis not present

## 2021-01-19 DIAGNOSIS — E042 Nontoxic multinodular goiter: Secondary | ICD-10-CM | POA: Diagnosis not present

## 2021-01-19 DIAGNOSIS — Z0000 Encounter for general adult medical examination without abnormal findings: Secondary | ICD-10-CM | POA: Diagnosis not present

## 2021-01-23 ENCOUNTER — Other Ambulatory Visit: Payer: Self-pay | Admitting: Cardiology

## 2021-01-23 NOTE — Telephone Encounter (Signed)
Refill sent to pharmacy.   Patient needs an appointment with Dr. Geraldo Pitter.

## 2021-02-14 ENCOUNTER — Ambulatory Visit: Payer: Medicare Other | Admitting: Neurology

## 2021-02-18 ENCOUNTER — Other Ambulatory Visit: Payer: Self-pay | Admitting: Cardiology

## 2021-04-10 ENCOUNTER — Telehealth: Payer: Self-pay | Admitting: Family Medicine

## 2021-04-10 NOTE — Progress Notes (Signed)
  Chronic Care Management   Outreach Note  04/10/2021 Name: Wesley Fuller MRN: [id number] DOB: Dec 03, 1941  Referred by: Marin Olp, MD Reason for referral : No chief complaint on file.   An unsuccessful telephone outreach was attempted today. The patient was referred to the pharmacist for assistance with care management and care coordination.   Follow Up Plan:   Lauretta Grill Upstream Scheduler

## 2021-07-24 ENCOUNTER — Other Ambulatory Visit: Payer: Self-pay | Admitting: Neurology

## 2021-11-28 ENCOUNTER — Telehealth: Payer: Self-pay | Admitting: Pulmonary Disease

## 2021-11-29 NOTE — Telephone Encounter (Signed)
Called Dr. Honor Junes office and was able to obtain fax number and pt's sleep study has been faxed for pt. Called and spoke with pt's spouse letting her know this had been done and she verbalized understanding.nothing further needed.

## 2023-08-24 DEATH — deceased
